# Patient Record
Sex: Female | Born: 1950 | Race: White | Hispanic: No | Marital: Married | Smoking: Never smoker
Health system: Southern US, Community
[De-identification: ages and names within clinical notes are randomized; demographics above are authoritative.]

## PROBLEM LIST (undated history)

## (undated) DIAGNOSIS — K449 Diaphragmatic hernia without obstruction or gangrene: Secondary | ICD-10-CM

## (undated) DIAGNOSIS — T7840XA Allergy, unspecified, initial encounter: Secondary | ICD-10-CM

## (undated) DIAGNOSIS — Z800 Family history of malignant neoplasm of digestive organs: Secondary | ICD-10-CM

## (undated) DIAGNOSIS — E119 Type 2 diabetes mellitus without complications: Secondary | ICD-10-CM

## (undated) DIAGNOSIS — Z803 Family history of malignant neoplasm of breast: Secondary | ICD-10-CM

## (undated) DIAGNOSIS — K219 Gastro-esophageal reflux disease without esophagitis: Secondary | ICD-10-CM

## (undated) DIAGNOSIS — L309 Dermatitis, unspecified: Secondary | ICD-10-CM

## (undated) DIAGNOSIS — N761 Subacute and chronic vaginitis: Secondary | ICD-10-CM

## (undated) DIAGNOSIS — G4730 Sleep apnea, unspecified: Secondary | ICD-10-CM

## (undated) DIAGNOSIS — J45909 Unspecified asthma, uncomplicated: Secondary | ICD-10-CM

## (undated) DIAGNOSIS — N941 Unspecified dyspareunia: Secondary | ICD-10-CM

## (undated) DIAGNOSIS — I10 Essential (primary) hypertension: Secondary | ICD-10-CM

## (undated) DIAGNOSIS — N952 Postmenopausal atrophic vaginitis: Secondary | ICD-10-CM

## (undated) DIAGNOSIS — E039 Hypothyroidism, unspecified: Secondary | ICD-10-CM

## (undated) DIAGNOSIS — I8390 Asymptomatic varicose veins of unspecified lower extremity: Secondary | ICD-10-CM

## (undated) DIAGNOSIS — E785 Hyperlipidemia, unspecified: Secondary | ICD-10-CM

## (undated) DIAGNOSIS — Z780 Asymptomatic menopausal state: Secondary | ICD-10-CM

## (undated) HISTORY — DX: Postmenopausal atrophic vaginitis: N95.2

## (undated) HISTORY — DX: Family history of malignant neoplasm of digestive organs: Z80.0

## (undated) HISTORY — DX: Sleep apnea, unspecified: G47.30

## (undated) HISTORY — DX: Unspecified dyspareunia: N94.10

## (undated) HISTORY — DX: Asymptomatic varicose veins of unspecified lower extremity: I83.90

## (undated) HISTORY — DX: Diaphragmatic hernia without obstruction or gangrene: K44.9

## (undated) HISTORY — DX: Subacute and chronic vaginitis: N76.1

## (undated) HISTORY — DX: Allergy, unspecified, initial encounter: T78.40XA

## (undated) HISTORY — DX: Morbid (severe) obesity due to excess calories: E66.01

## (undated) HISTORY — DX: Family history of malignant neoplasm of breast: Z80.3

## (undated) HISTORY — PX: NASAL SINUS SURGERY: SHX719

## (undated) HISTORY — PX: CHOLECYSTECTOMY: SHX55

## (undated) HISTORY — DX: Asymptomatic menopausal state: Z78.0

## (undated) HISTORY — PX: BREAST SURGERY: SHX581

## (undated) HISTORY — DX: Dermatitis, unspecified: L30.9

## (undated) HISTORY — DX: Gastro-esophageal reflux disease without esophagitis: K21.9

## (undated) HISTORY — PX: DILATION AND CURETTAGE OF UTERUS: SHX78

## (undated) HISTORY — DX: Type 2 diabetes mellitus without complications: E11.9

## (undated) SURGERY — Surgical Case
Anesthesia: *Unknown

---

## 1980-03-02 HISTORY — PX: VAGINAL HYSTERECTOMY: SUR661

## 1994-03-02 HISTORY — PX: BREAST EXCISIONAL BIOPSY: SUR124

## 2004-07-24 ENCOUNTER — Ambulatory Visit: Payer: Self-pay | Admitting: Unknown Physician Specialty

## 2005-08-18 ENCOUNTER — Ambulatory Visit: Payer: Self-pay | Admitting: Unknown Physician Specialty

## 2005-08-25 ENCOUNTER — Ambulatory Visit: Payer: Self-pay | Admitting: Unknown Physician Specialty

## 2006-02-19 ENCOUNTER — Ambulatory Visit: Payer: Self-pay | Admitting: Unknown Physician Specialty

## 2006-08-31 ENCOUNTER — Ambulatory Visit: Payer: Self-pay | Admitting: Unknown Physician Specialty

## 2007-09-01 ENCOUNTER — Ambulatory Visit: Payer: Self-pay | Admitting: Unknown Physician Specialty

## 2008-06-14 ENCOUNTER — Ambulatory Visit: Payer: Self-pay | Admitting: Family

## 2008-06-26 ENCOUNTER — Ambulatory Visit: Payer: Self-pay | Admitting: Unknown Physician Specialty

## 2008-09-24 ENCOUNTER — Ambulatory Visit: Payer: Self-pay | Admitting: Unknown Physician Specialty

## 2009-09-25 ENCOUNTER — Ambulatory Visit: Payer: Self-pay | Admitting: Unknown Physician Specialty

## 2009-10-23 ENCOUNTER — Ambulatory Visit: Payer: Self-pay | Admitting: Specialist

## 2010-05-12 ENCOUNTER — Ambulatory Visit: Payer: Self-pay | Admitting: General Practice

## 2010-11-06 ENCOUNTER — Ambulatory Visit: Payer: Self-pay | Admitting: Internal Medicine

## 2011-11-09 ENCOUNTER — Ambulatory Visit: Payer: Self-pay | Admitting: Internal Medicine

## 2012-11-09 ENCOUNTER — Ambulatory Visit: Payer: Self-pay | Admitting: Internal Medicine

## 2013-11-14 ENCOUNTER — Ambulatory Visit: Payer: Self-pay | Admitting: Internal Medicine

## 2014-01-17 ENCOUNTER — Encounter: Payer: Self-pay | Admitting: Podiatry

## 2014-01-17 ENCOUNTER — Ambulatory Visit (INDEPENDENT_AMBULATORY_CARE_PROVIDER_SITE_OTHER): Payer: 59 | Admitting: Podiatry

## 2014-01-17 ENCOUNTER — Other Ambulatory Visit: Payer: Self-pay | Admitting: *Deleted

## 2014-01-17 VITALS — BP 97/70 | HR 87 | Resp 16 | Ht 64.0 in | Wt 233.0 lb

## 2014-01-17 DIAGNOSIS — Q828 Other specified congenital malformations of skin: Secondary | ICD-10-CM

## 2014-01-17 DIAGNOSIS — E119 Type 2 diabetes mellitus without complications: Secondary | ICD-10-CM

## 2014-01-17 NOTE — Progress Notes (Signed)
She presents today for a diabetic checkup of the bilateral foot. She states that she has a painful lesion to the plantar lateral aspect of the left foot just beneath the little toe that is painful with shoe gear. She states that I would like to have it removed.  Objective: Vital signs are stable she is alert and oriented 3. Pulses are strongly palpable bilateral. Neurologic sensorium is intact versus once the monofilament. Multiple varicosities with mild edema to the bilateral lower extremities. Deep tendon reflexes are intact bilateral muscle strength is 5 over 5 dorsiflexion plantar flexors and inverters and everters all intrinsic musculature is intact. Orthopedic evaluation demonstrates all joints distal to the ankle for range of motion without crepitation. Mild hallux valgus deformity with flexible hammertoe deformities are noted bilateral but are asymptomatic. She does have some dorsal spurring first metatarsal second metatarsal cuneiform joint area left foot consistent with early osteoarthritic changes and some tenderness on palpation of the deep peroneal nerve. Cutaneous evaluation demonstrates supple well-hydrated cutis erythema cellulitis drainage or odor. Porokeratotic lesion sub-fifth metatarsal head of the left foot is present. I removed that porokeratosis today without complication.  Assessment:diabetes mellitus type 2 without pedal complications. 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 Non-complicated.early osteoarthritic changes mid foot left.  Plan: Debrided area of reactive hyperkeratosis. No iatrogenic lesions. We discussed appropriate shoe gear to prevent compression of the deep peroneal nerve to the dorsal aspect of the left foot.I will follow-up with her in 1 year or sooner if necessary.

## 2014-05-01 ENCOUNTER — Ambulatory Visit
Admit: 2014-05-01 | Disposition: A | Discharge status: Home / Self Care | Payer: Self-pay | Attending: Internal Medicine | Admitting: Internal Medicine

## 2014-05-01 DIAGNOSIS — M1990 Unspecified osteoarthritis, unspecified site: Secondary | ICD-10-CM | POA: Diagnosis not present

## 2014-05-01 DIAGNOSIS — Z79899 Other long term (current) drug therapy: Secondary | ICD-10-CM | POA: Diagnosis not present

## 2014-05-01 DIAGNOSIS — Z7982 Long term (current) use of aspirin: Secondary | ICD-10-CM | POA: Diagnosis not present

## 2014-05-01 DIAGNOSIS — E039 Hypothyroidism, unspecified: Secondary | ICD-10-CM | POA: Diagnosis not present

## 2014-05-01 DIAGNOSIS — D72829 Elevated white blood cell count, unspecified: Secondary | ICD-10-CM | POA: Diagnosis not present

## 2014-05-01 DIAGNOSIS — I10 Essential (primary) hypertension: Secondary | ICD-10-CM | POA: Diagnosis not present

## 2014-05-01 DIAGNOSIS — E119 Type 2 diabetes mellitus without complications: Secondary | ICD-10-CM | POA: Diagnosis not present

## 2014-05-01 DIAGNOSIS — K219 Gastro-esophageal reflux disease without esophagitis: Secondary | ICD-10-CM | POA: Diagnosis not present

## 2014-05-01 DIAGNOSIS — E781 Pure hyperglyceridemia: Secondary | ICD-10-CM | POA: Diagnosis not present

## 2014-05-01 DIAGNOSIS — D509 Iron deficiency anemia, unspecified: Secondary | ICD-10-CM | POA: Diagnosis not present

## 2014-05-01 DIAGNOSIS — K529 Noninfective gastroenteritis and colitis, unspecified: Secondary | ICD-10-CM | POA: Diagnosis not present

## 2014-05-01 DIAGNOSIS — J309 Allergic rhinitis, unspecified: Secondary | ICD-10-CM | POA: Diagnosis not present

## 2014-05-01 DIAGNOSIS — R0981 Nasal congestion: Secondary | ICD-10-CM | POA: Diagnosis not present

## 2014-05-01 DIAGNOSIS — J45909 Unspecified asthma, uncomplicated: Secondary | ICD-10-CM | POA: Diagnosis not present

## 2014-05-15 DIAGNOSIS — D72829 Elevated white blood cell count, unspecified: Secondary | ICD-10-CM | POA: Diagnosis not present

## 2014-05-15 DIAGNOSIS — J309 Allergic rhinitis, unspecified: Secondary | ICD-10-CM | POA: Diagnosis not present

## 2014-05-15 DIAGNOSIS — E119 Type 2 diabetes mellitus without complications: Secondary | ICD-10-CM | POA: Diagnosis not present

## 2014-05-15 DIAGNOSIS — D509 Iron deficiency anemia, unspecified: Secondary | ICD-10-CM | POA: Diagnosis not present

## 2014-05-15 DIAGNOSIS — R0981 Nasal congestion: Secondary | ICD-10-CM | POA: Diagnosis not present

## 2014-05-15 DIAGNOSIS — K529 Noninfective gastroenteritis and colitis, unspecified: Secondary | ICD-10-CM | POA: Diagnosis not present

## 2014-05-29 DIAGNOSIS — J301 Allergic rhinitis due to pollen: Secondary | ICD-10-CM | POA: Diagnosis not present

## 2014-05-29 DIAGNOSIS — J4530 Mild persistent asthma, uncomplicated: Secondary | ICD-10-CM | POA: Diagnosis not present

## 2014-05-30 DIAGNOSIS — J328 Other chronic sinusitis: Secondary | ICD-10-CM | POA: Diagnosis not present

## 2014-05-30 DIAGNOSIS — R0982 Postnasal drip: Secondary | ICD-10-CM | POA: Diagnosis not present

## 2014-05-30 DIAGNOSIS — J301 Allergic rhinitis due to pollen: Secondary | ICD-10-CM | POA: Diagnosis not present

## 2014-06-01 ENCOUNTER — Ambulatory Visit
Admit: 2014-06-01 | Disposition: A | Discharge status: Home / Self Care | Payer: Self-pay | Attending: Internal Medicine | Admitting: Internal Medicine

## 2014-06-05 LAB — CBC CANCER CENTER
BASOS ABS: 0 x10 3/mm (ref 0.0–0.1)
Basophil %: 0.3 %
EOS ABS: 0.2 x10 3/mm (ref 0.0–0.7)
Eosinophil %: 1.2 %
HCT: 38.7 % (ref 35.0–47.0)
HGB: 12.8 g/dL (ref 12.0–16.0)
LYMPHS ABS: 3.3 x10 3/mm (ref 1.0–3.6)
LYMPHS PCT: 21.5 %
MCH: 29.4 pg (ref 26.0–34.0)
MCHC: 33.1 g/dL (ref 32.0–36.0)
MCV: 89 fL (ref 80–100)
MONOS PCT: 6.4 %
Monocyte #: 1 x10 3/mm — ABNORMAL HIGH (ref 0.2–0.9)
NEUTROS ABS: 10.8 x10 3/mm — AB (ref 1.4–6.5)
NEUTROS PCT: 70.6 %
Platelet: 308 x10 3/mm (ref 150–440)
RBC: 4.34 10*6/uL (ref 3.80–5.20)
RDW: 13.2 % (ref 11.5–14.5)
WBC: 15.3 x10 3/mm — AB (ref 3.6–11.0)

## 2014-09-11 ENCOUNTER — Ambulatory Visit: Payer: Self-pay | Admitting: Family Medicine

## 2014-09-11 ENCOUNTER — Other Ambulatory Visit: Payer: Self-pay

## 2014-10-01 ENCOUNTER — Other Ambulatory Visit: Payer: Self-pay | Admitting: Obstetrics and Gynecology

## 2014-10-01 DIAGNOSIS — Z1231 Encounter for screening mammogram for malignant neoplasm of breast: Secondary | ICD-10-CM

## 2014-10-11 ENCOUNTER — Encounter: Payer: Self-pay | Admitting: *Deleted

## 2014-10-12 ENCOUNTER — Ambulatory Visit
Admission: RE | Admit: 2014-10-12 | Discharge: 2014-10-12 | Disposition: A | Discharge status: Home / Self Care | Payer: Medicare HMO | Source: Ambulatory Visit | Attending: Unknown Physician Specialty | Admitting: Unknown Physician Specialty

## 2014-10-12 ENCOUNTER — Encounter: Payer: Self-pay | Admitting: Anesthesiology

## 2014-10-12 ENCOUNTER — Encounter
Admission: RE | Disposition: A | Discharge status: Home / Self Care | Payer: Self-pay | Source: Ambulatory Visit | Attending: Unknown Physician Specialty

## 2014-10-12 ENCOUNTER — Ambulatory Visit: Payer: Medicare HMO | Admitting: Anesthesiology

## 2014-10-12 DIAGNOSIS — K2980 Duodenitis without bleeding: Secondary | ICD-10-CM | POA: Diagnosis not present

## 2014-10-12 DIAGNOSIS — R1012 Left upper quadrant pain: Secondary | ICD-10-CM | POA: Diagnosis present

## 2014-10-12 DIAGNOSIS — K5730 Diverticulosis of large intestine without perforation or abscess without bleeding: Secondary | ICD-10-CM | POA: Insufficient documentation

## 2014-10-12 DIAGNOSIS — R1310 Dysphagia, unspecified: Secondary | ICD-10-CM | POA: Diagnosis not present

## 2014-10-12 DIAGNOSIS — K317 Polyp of stomach and duodenum: Secondary | ICD-10-CM | POA: Insufficient documentation

## 2014-10-12 DIAGNOSIS — Z882 Allergy status to sulfonamides status: Secondary | ICD-10-CM | POA: Diagnosis not present

## 2014-10-12 DIAGNOSIS — Z90710 Acquired absence of both cervix and uterus: Secondary | ICD-10-CM | POA: Insufficient documentation

## 2014-10-12 DIAGNOSIS — Z888 Allergy status to other drugs, medicaments and biological substances status: Secondary | ICD-10-CM | POA: Diagnosis not present

## 2014-10-12 DIAGNOSIS — Z7982 Long term (current) use of aspirin: Secondary | ICD-10-CM | POA: Insufficient documentation

## 2014-10-12 DIAGNOSIS — Z791 Long term (current) use of non-steroidal anti-inflammatories (NSAID): Secondary | ICD-10-CM | POA: Diagnosis not present

## 2014-10-12 DIAGNOSIS — J45909 Unspecified asthma, uncomplicated: Secondary | ICD-10-CM | POA: Insufficient documentation

## 2014-10-12 DIAGNOSIS — E785 Hyperlipidemia, unspecified: Secondary | ICD-10-CM | POA: Diagnosis not present

## 2014-10-12 DIAGNOSIS — D509 Iron deficiency anemia, unspecified: Secondary | ICD-10-CM | POA: Insufficient documentation

## 2014-10-12 DIAGNOSIS — E039 Hypothyroidism, unspecified: Secondary | ICD-10-CM | POA: Diagnosis not present

## 2014-10-12 DIAGNOSIS — Z9049 Acquired absence of other specified parts of digestive tract: Secondary | ICD-10-CM | POA: Diagnosis not present

## 2014-10-12 DIAGNOSIS — I10 Essential (primary) hypertension: Secondary | ICD-10-CM | POA: Insufficient documentation

## 2014-10-12 DIAGNOSIS — K640 First degree hemorrhoids: Secondary | ICD-10-CM | POA: Insufficient documentation

## 2014-10-12 DIAGNOSIS — Z79899 Other long term (current) drug therapy: Secondary | ICD-10-CM | POA: Diagnosis not present

## 2014-10-12 HISTORY — DX: Hyperlipidemia, unspecified: E78.5

## 2014-10-12 HISTORY — DX: Essential (primary) hypertension: I10

## 2014-10-12 HISTORY — DX: Unspecified asthma, uncomplicated: J45.909

## 2014-10-12 HISTORY — DX: Hypothyroidism, unspecified: E03.9

## 2014-10-12 HISTORY — PX: ESOPHAGOGASTRODUODENOSCOPY (EGD) WITH PROPOFOL: SHX5813

## 2014-10-12 HISTORY — PX: COLONOSCOPY WITH PROPOFOL: SHX5780

## 2014-10-12 LAB — GLUCOSE, CAPILLARY: GLUCOSE-CAPILLARY: 129 mg/dL — AB (ref 65–99)

## 2014-10-12 SURGERY — COLONOSCOPY WITH PROPOFOL
Anesthesia: General

## 2014-10-12 MED ORDER — SODIUM CHLORIDE 0.9 % IV SOLN: INTRAVENOUS | Status: DC

## 2014-10-12 MED ORDER — LIDOCAINE HCL (CARDIAC) 20 MG/ML IV SOLN
INTRAVENOUS | Status: DC | PRN
  Administered 2014-10-12: 40 mg via INTRAVENOUS

## 2014-10-12 MED ORDER — GLYCOPYRROLATE 0.2 MG/ML IJ SOLN
INTRAMUSCULAR | Status: DC | PRN
  Administered 2014-10-12: 0.1 mg via INTRAVENOUS

## 2014-10-12 MED ORDER — PROPOFOL INFUSION 10 MG/ML OPTIME
INTRAVENOUS | Status: DC | PRN
  Administered 2014-10-12: 120 ug/kg/min via INTRAVENOUS

## 2014-10-12 MED ORDER — FENTANYL CITRATE (PF) 100 MCG/2ML IJ SOLN
INTRAMUSCULAR | Status: DC | PRN
  Administered 2014-10-12: 50 ug via INTRAVENOUS

## 2014-10-12 MED ORDER — SODIUM CHLORIDE 0.9 % IV SOLN
INTRAVENOUS | Status: DC
  Administered 2014-10-12: 1000 mL via INTRAVENOUS

## 2014-10-12 MED ORDER — MIDAZOLAM HCL 2 MG/2ML IJ SOLN
INTRAMUSCULAR | Status: DC | PRN
  Administered 2014-10-12: 1 mg via INTRAVENOUS

## 2014-10-12 NOTE — Anesthesia Preprocedure Evaluation (Addendum)
Anesthesia Evaluation  Patient identified by MRN, date of birth, ID band Patient awake    Reviewed: Allergy & Precautions, H&P , NPO status , Patient's Chart, lab work & pertinent test results, reviewed documented beta blocker date and time   History of Anesthesia Complications Negative for: history of anesthetic complications  Airway Mallampati: I  TM Distance: >3 FB Neck ROM: full    Dental no notable dental hx. (+) Teeth Intact   Pulmonary neg shortness of breath, asthma , neg sleep apnea, neg COPDneg recent URI,  breath sounds clear to auscultation  Pulmonary exam normal       Cardiovascular Exercise Tolerance: Good hypertension, On Medications - angina- CAD, - Past MI and - CABG Normal cardiovascular exam- dysrhythmias - Valvular Problems/MurmursRhythm:regular Rate:Normal     Neuro/Psych negative neurological ROS  negative psych ROS   GI/Hepatic Neg liver ROS, GERD-  ,  Endo/Other  diabetes, Well Controlled, Oral Hypoglycemic AgentsHypothyroidism Morbid obesity  Renal/GU negative Renal ROS  negative genitourinary   Musculoskeletal   Abdominal   Peds  Hematology negative hematology ROS (+)   Anesthesia Other Findings Past Medical History:   Hypothyroidism                                               Hypertension                                                 Asthma                                                       Hyperlipidemia                                               Reproductive/Obstetrics negative OB ROS                            Anesthesia Physical Anesthesia Plan  ASA: III  Anesthesia Plan: General   Post-op Pain Management:    Induction:   Airway Management Planned:   Additional Equipment:   Intra-op Plan:   Post-operative Plan:   Informed Consent: I have reviewed the patients History and Physical, chart, labs and discussed the procedure including the  risks, benefits and alternatives for the proposed anesthesia with the patient or authorized representative who has indicated his/her understanding and acceptance.   Dental Advisory Given  Plan Discussed with: Anesthesiologist, CRNA and Surgeon  Anesthesia Plan Comments:         Anesthesia Quick Evaluation

## 2014-10-12 NOTE — Transfer of Care (Signed)
Immediate Anesthesia Transfer of Care Note  Patient: Danielle Delgado  Procedure(s) Performed: Procedure(s): COLONOSCOPY WITH PROPOFOL (N/A) ESOPHAGOGASTRODUODENOSCOPY (EGD) WITH PROPOFOL (N/A)  Patient Location: PACU  Anesthesia Type:General  Level of Consciousness: awake and sedated  Airway & Oxygen Therapy: Patient Spontanous Breathing and Patient connected to nasal cannula oxygen  Post-op Assessment: Report given to RN and Post -op Vital signs reviewed and stable  Post vital signs: Reviewed and stable  Last Vitals:  Filed Vitals:   10/12/14 1022  BP: 143/79  Pulse: 81  Temp: 36.8 C  Resp: 16    Complications: No apparent anesthesia complications

## 2014-10-12 NOTE — Op Note (Signed)
[facility] Gastroenterology Patient Name: Danielle Delgado Procedure Date: 10/12/2014 10:43 AM MRN: [id number] Account #: [id number] Date of Birth: Sep 14, 1950 Admit Type: Outpatient Age: 64 Room: [facility] ENDO ROOM 4 Gender: Female Note Status: Finalized Procedure:         Upper GI endoscopy Indications:       Abdominal pain in the left upper quadrant, Iron deficiency                     anemia Providers:         Manya Silvas, MD Referring MD:      Casilda Carls, MD (Referring MD) Medicines:         Propofol per Anesthesia Complications:     No immediate complications. Procedure:         Pre-Anesthesia Assessment:                    - After reviewing the risks and benefits, the patient was                     deemed in satisfactory condition to undergo the procedure.                    After obtaining informed consent, the endoscope was passed                     under direct vision. Throughout the procedure, the                     patient's blood pressure, pulse, and oxygen saturations                     were monitored continuously. The Endoscope was introduced                     through the mouth, and advanced to the second part of                     duodenum. The upper GI endoscopy was accomplished without                     difficulty. The patient tolerated the procedure well. Findings:      The examined esophagus was normal.      Five medium pedunculated and sessile polyps with no bleeding and       stigmata of recent bleeding were found in the gastric body. These polyps       were removed with a hot snare. Resection and retrieval were complete.      Patchy mildly erythematous mucosa without active bleeding and with no       stigmata of bleeding was found in the duodenal bulb. Impression:        - Normal esophagus.                    - Five gastric polyps. Resected and retrieved.                    - Normal examined duodenum. Recommendation:    - Await  pathology results.                    - Perform a colonoscopy as previously scheduled. Manya Silvas, MD 10/12/2014 11:10:04 AM This report has been signed electronically. Number of Addenda: 0 Note Initiated On: 10/12/2014  10:43 AM      [facility]

## 2014-10-12 NOTE — Op Note (Signed)
[facility] Gastroenterology Patient Name: Danielle Delgado Procedure Date: 10/12/2014 10:43 AM MRN: [id number] Account #: [id number] Date of Birth: 12-11-1950 Admit Type: Outpatient Age: 64 Room: [facility] ENDO ROOM 4 Gender: Female Note Status: Finalized Procedure:         Colonoscopy Indications:       Iron deficiency anemia Providers:         Manya Silvas, MD Referring MD:      Casilda Carls, MD (Referring MD) Medicines:         Propofol per Anesthesia Complications:     No immediate complications. Procedure:         Pre-Anesthesia Assessment:                    - After reviewing the risks and benefits, the patient was                     deemed in satisfactory condition to undergo the procedure.                    After obtaining informed consent, the colonoscope was                     passed under direct vision. Throughout the procedure, the                     patient's blood pressure, pulse, and oxygen saturations                     were monitored continuously. The Colonoscope was                     introduced through the anus and advanced to the the cecum,                     identified by appendiceal orifice and ileocecal valve. The                     colonoscopy was performed without difficulty. The patient                     tolerated the procedure well. The quality of the bowel                     preparation was excellent. Findings:      Internal hemorrhoids were found during endoscopy. The hemorrhoids were       small and Grade I (internal hemorrhoids that do not prolapse).      Multiple medium-mouthed diverticula were found in the sigmoid colon.      The exam was otherwise without abnormality. Impression:        - Internal hemorrhoids.                    - Diverticulosis in the sigmoid colon.                    - The examination was otherwise normal.                    - No specimens collected. Recommendation:    - Await pathology  results. Manya Silvas, MD 10/12/2014 11:26:32 AM This report has been signed electronically. Number of Addenda: 0 Note Initiated On: 10/12/2014 10:43 AM Scope Withdrawal Time: 0 hours 8 minutes 41 seconds  Total Procedure Duration:  0 hours 12 minutes 0 seconds       [facility]

## 2014-10-12 NOTE — Anesthesia Procedure Notes (Signed)
Performed by: Vaughan Sine Pre-anesthesia Checklist: Patient identified, Suction available, Emergency Drugs available, Patient being monitored and Timeout performed Patient Re-evaluated:Patient Re-evaluated prior to inductionOxygen Delivery Method: Nasal cannula Preoxygenation: Pre-oxygenation with 100% oxygen Intubation Type: IV induction Airway Equipment and Method: Bite block Placement Confirmation: positive ETCO2 and CO2 detector

## 2014-10-12 NOTE — H&P (Signed)
Primary Care Physician:  Casilda Carls, MD Primary Gastroenterologist:  Dr. Vira Agar  Pre-Procedure History & Physical: HPI:  Danielle Delgado is a 64 y.o. female is here for an endoscopy and colonoscopy.   Past Medical History  Diagnosis Date  . Hypothyroidism   . Hypertension   . Asthma   . Hyperlipidemia     Past Surgical History  Procedure Laterality Date  . Abdominal hysterectomy    . Cholecystectomy      Prior to Admission medications   Medication Sig Start Date End Date Taking? Authorizing Provider  ACCU-CHEK AVIVA PLUS test strip  10/21/13  Yes Historical Provider, MD  acetaminophen (TYLENOL) 500 MG tablet Take 1,000 mg by mouth every 6 (six) hours as needed.   Yes Historical Provider, MD  aspirin 325 MG EC tablet Take 325 mg by mouth daily.   Yes Historical Provider, MD  atorvastatin (LIPITOR) 10 MG tablet  01/11/14  Yes Historical Provider, MD  ferrous sulfate 325 (65 FE) MG tablet Take 325 mg by mouth daily with breakfast.   Yes Historical Provider, MD  losartan-hydrochlorothiazide (HYZAAR) 50-12.5 MG per tablet  01/11/14  Yes Historical Provider, MD  montelukast (SINGULAIR) 10 MG tablet  01/12/14  Yes Historical Provider, MD  naproxen sodium (ANAPROX) 220 MG tablet Take 440 mg by mouth as needed.   Yes Historical Provider, MD  ONGLYZA 5 MG TABS tablet  01/11/14  Yes Historical Provider, MD  pantoprazole (PROTONIX) 40 MG tablet  01/11/14  Yes Historical Provider, MD  Doug Sou 90 MCG/ACT inhaler  12/21/13  Yes Historical Provider, MD  SYNTHROID 125 MCG tablet  01/12/14  Yes Historical Provider, MD  valsartan-hydrochlorothiazide (DIOVAN-HCT) 160-25 MG per tablet Take 1 tablet by mouth daily.   Yes Historical Provider, MD  metformin (FORTAMET) 1000 MG (OSM) 24 hr tablet Take 1,000 mg by mouth daily with breakfast.    Historical Provider, MD    Allergies as of 09/28/2014 - Review Complete 01/17/2014  Allergen Reaction Noted  . Propulsid [cisapride] Hives  01/17/2014  . Sulfa antibiotics Hives 01/17/2014    History reviewed. No pertinent family history.  Social History   Social History  . Marital Status: Married    Spouse Name: N/A  . Number of Children: N/A  . Years of Education: N/A   Occupational History  . Not on file.   Social History Main Topics  . Smoking status: Never Smoker   . Smokeless tobacco: Not on file  . Alcohol Use: No  . Drug Use: Not on file  . Sexual Activity: Not on file   Other Topics Concern  . Not on file   Social History Narrative    Review of Systems: See HPI, otherwise negative ROS  Physical Exam: BP 143/79 mmHg  Pulse 81  Temp(Src) 98.2 F (36.8 C) (Tympanic)  Resp 16  Ht 5\' 3"  (1.6 m)  Wt 105.235 kg (232 lb)  BMI 41.11 kg/m2  SpO2 97% General:   Alert,  pleasant and cooperative in NAD Head:  Normocephalic and atraumatic. Neck:  Supple; no masses or thyromegaly. Lungs:  Clear throughout to auscultation.    Heart:  Regular rate and rhythm. Abdomen:  Soft, nontender and nondistended. Normal bowel sounds, without guarding, and without rebound.   Neurologic:  Alert and  oriented x4;  grossly normal neurologically.  Impression/Plan: Danielle Delgado is here for an endoscopy and colonoscopy to be performed for iron def anemia  Risks, benefits, limitations, and alternatives regarding  endoscopy and colonoscopy  have been reviewed with the patient.  Questions have been answered.  All parties agreeable.   Gaylyn Cheers, MD  10/12/2014, 10:46 AM

## 2014-10-14 NOTE — Anesthesia Postprocedure Evaluation (Signed)
  Anesthesia Post-op Note  Patient: Danielle Delgado  Procedure(s) Performed: Procedure(s): COLONOSCOPY WITH PROPOFOL (N/A) ESOPHAGOGASTRODUODENOSCOPY (EGD) WITH PROPOFOL (N/A)  Anesthesia type:General  Patient location: PACU  Post pain: Pain level controlled  Post assessment: Post-op Vital signs reviewed, Patient's Cardiovascular Status Stable, Respiratory Function Stable, Patent Airway and No signs of Nausea or vomiting  Post vital signs: Reviewed and stable  Last Vitals:  Filed Vitals:   10/12/14 1202  BP: 124/76  Pulse: 81  Temp:   Resp: 15    Level of consciousness: awake, alert  and patient cooperative  Complications: No apparent anesthesia complications

## 2014-10-15 LAB — SURGICAL PATHOLOGY

## 2014-10-16 ENCOUNTER — Ambulatory Visit: Payer: Self-pay | Admitting: Family Medicine

## 2014-10-16 ENCOUNTER — Other Ambulatory Visit: Payer: Self-pay

## 2014-10-30 ENCOUNTER — Ambulatory Visit: Payer: Self-pay | Admitting: Family Medicine

## 2014-10-30 ENCOUNTER — Other Ambulatory Visit: Payer: Self-pay

## 2014-11-05 ENCOUNTER — Other Ambulatory Visit: Payer: Self-pay

## 2014-11-05 ENCOUNTER — Ambulatory Visit: Payer: Self-pay | Admitting: Family Medicine

## 2014-11-06 ENCOUNTER — Other Ambulatory Visit: Payer: Self-pay | Admitting: *Deleted

## 2014-11-06 ENCOUNTER — Inpatient Hospital Stay (HOSPITAL_BASED_OUTPATIENT_CLINIC_OR_DEPARTMENT_OTHER): Payer: Medicare HMO | Admitting: Family Medicine

## 2014-11-06 ENCOUNTER — Inpatient Hospital Stay: Payer: Medicare HMO | Attending: Family Medicine

## 2014-11-06 DIAGNOSIS — E785 Hyperlipidemia, unspecified: Secondary | ICD-10-CM

## 2014-11-06 DIAGNOSIS — Z7982 Long term (current) use of aspirin: Secondary | ICD-10-CM

## 2014-11-06 DIAGNOSIS — K5790 Diverticulosis of intestine, part unspecified, without perforation or abscess without bleeding: Secondary | ICD-10-CM | POA: Diagnosis not present

## 2014-11-06 DIAGNOSIS — K648 Other hemorrhoids: Secondary | ICD-10-CM

## 2014-11-06 DIAGNOSIS — Z79899 Other long term (current) drug therapy: Secondary | ICD-10-CM | POA: Insufficient documentation

## 2014-11-06 DIAGNOSIS — D72829 Elevated white blood cell count, unspecified: Secondary | ICD-10-CM

## 2014-11-06 DIAGNOSIS — Z86010 Personal history of colonic polyps: Secondary | ICD-10-CM

## 2014-11-06 DIAGNOSIS — D649 Anemia, unspecified: Secondary | ICD-10-CM

## 2014-11-06 DIAGNOSIS — I10 Essential (primary) hypertension: Secondary | ICD-10-CM | POA: Diagnosis not present

## 2014-11-06 DIAGNOSIS — J45909 Unspecified asthma, uncomplicated: Secondary | ICD-10-CM

## 2014-11-06 DIAGNOSIS — E039 Hypothyroidism, unspecified: Secondary | ICD-10-CM | POA: Insufficient documentation

## 2014-11-06 LAB — CBC WITH DIFFERENTIAL/PLATELET
Basophils Absolute: 0.1 10*3/uL (ref 0–0.1)
Basophils Relative: 1 %
Eosinophils Absolute: 0.5 10*3/uL (ref 0–0.7)
Eosinophils Relative: 4 %
HEMATOCRIT: 35.5 % (ref 35.0–47.0)
HEMOGLOBIN: 11.8 g/dL — AB (ref 12.0–16.0)
LYMPHS ABS: 3.3 10*3/uL (ref 1.0–3.6)
LYMPHS PCT: 29 %
MCH: 29.7 pg (ref 26.0–34.0)
MCHC: 33.3 g/dL (ref 32.0–36.0)
MCV: 89.3 fL (ref 80.0–100.0)
Monocytes Absolute: 0.6 10*3/uL (ref 0.2–0.9)
Monocytes Relative: 6 %
NEUTROS PCT: 60 %
Neutro Abs: 6.9 10*3/uL — ABNORMAL HIGH (ref 1.4–6.5)
PLATELETS: 284 10*3/uL (ref 150–440)
RBC: 3.98 MIL/uL (ref 3.80–5.20)
RDW: 13.8 % (ref 11.5–14.5)
WBC: 11.4 10*3/uL — ABNORMAL HIGH (ref 3.6–11.0)

## 2014-11-06 NOTE — Progress Notes (Signed)
[facility] [place]  [phone]  [phone]     Danielle Delgado DOB: August 21, 1950  MR#: [id number]  QIW#:[id number]  Patient Care Team: Casilda Carls, MD as PCP - General (Internal Medicine)  CHIEF COMPLAINT:  Patient is here for follow-up regarding leukocytosis and iron deficiency anemia.  INTERVAL HISTORY: Patient is here for continued follow-up and treatment consideration regarding leukocytosis and anemia. She has been followed for leukocytosis with mild neutrophilia since 2009. Her primary care provider is Dr. Rosario Jacks. She reports most recently having a colonoscopy and endoscopy with Dr. Vira Agar in August. She was noted on endoscopy to have 5 medium polyps with no active bleeding and a stigmata of recent bleeding in the gastric body. This polyps were removed. Colonoscopy found patient to have internal hemorrhoids, small grade 1, also noted to have multiple medium mouth diverticula in the sigmoid colon. Patient otherwise feels very well, she denies any acute complaints. She states that Dr. Inez Pilgrim has told her if her labs were previously her stable that this could be her last visit.  REVIEW OF SYSTEMS:   Review of Systems  Constitutional: Negative for fever, chills, weight loss, malaise/fatigue and diaphoresis.  HENT: Negative for congestion, ear discharge, ear pain, hearing loss, nosebleeds, sore throat and tinnitus.   Eyes: Negative for blurred vision, double vision, photophobia, pain, discharge and redness.  Respiratory: Negative for cough, hemoptysis, sputum production, shortness of breath, wheezing and stridor.   Cardiovascular: Negative for chest pain, palpitations, orthopnea, claudication, leg swelling and PND.  Gastrointestinal: Negative for heartburn, nausea, vomiting, abdominal pain, diarrhea, constipation, blood in stool and melena.  Genitourinary: Negative.   Musculoskeletal: Negative.   Skin: Negative.   Neurological: Negative for dizziness, tingling,  focal weakness, seizures, weakness and headaches.  Endo/Heme/Allergies: Does not bruise/bleed easily.  Psychiatric/Behavioral: Negative for depression. The patient is not nervous/anxious and does not have insomnia.     As per HPI. Otherwise, a complete review of systems is negatve.   PAST MEDICAL HISTORY: Past Medical History  Diagnosis Date  . Hypothyroidism   . Hypertension   . Asthma   . Hyperlipidemia     PAST SURGICAL HISTORY: Past Surgical History  Procedure Laterality Date  . Abdominal hysterectomy    . Cholecystectomy    . Colonoscopy with propofol N/A 10/12/2014    Procedure: COLONOSCOPY WITH PROPOFOL;  Surgeon: Manya Silvas, MD;  Location: [facility] ENDOSCOPY;  Service: Endoscopy;  Laterality: N/A;  . Esophagogastroduodenoscopy (egd) with propofol N/A 10/12/2014    Procedure: ESOPHAGOGASTRODUODENOSCOPY (EGD) WITH PROPOFOL;  Surgeon: Manya Silvas, MD;  Location: [facility] ENDOSCOPY;  Service: Endoscopy;  Laterality: N/A;    FAMILY HISTORY No family history on file.  GYNECOLOGIC HISTORY:  No LMP recorded. Patient has had a hysterectomy.     ADVANCED DIRECTIVES:    HEALTH MAINTENANCE: Social History  Substance Use Topics  . Smoking status: Never Smoker   . Smokeless tobacco: Not on file  . Alcohol Use: No     Colonoscopy:  PAP:  Bone density:  Lipid panel:  Allergies  Allergen Reactions  . Propulsid [Cisapride] Hives  . Sulfa Antibiotics Hives    Current Outpatient Prescriptions  Medication Sig Dispense Refill  . ACCU-CHEK AVIVA PLUS test strip     . acetaminophen (TYLENOL) 500 MG tablet Take 1,000 mg by mouth every 6 (six) hours as needed.    Marland Kitchen albuterol (PROAIR HFA) 108 (90 BASE) MCG/ACT inhaler Inhale into the lungs.    Marland Kitchen aspirin 325 MG EC tablet  Take 325 mg by mouth daily.    Marland Kitchen atorvastatin (LIPITOR) 10 MG tablet     . Biotin 5000 MCG TABS Take 1 tablet by mouth daily.    . calcium carbonate (OS-CAL) 600 MG TABS tablet Take 600 mg by mouth 2  (two) times daily with a meal.    . cetirizine (ZYRTEC) 10 MG tablet Take 10 mg by mouth daily.    . ferrous sulfate 325 (65 FE) MG tablet Take 325 mg by mouth daily with breakfast.    . glipiZIDE (GLUCOTROL XL) 5 MG 24 hr tablet     . losartan-hydrochlorothiazide (HYZAAR) 50-12.5 MG per tablet     . metFORMIN (GLUCOPHAGE) 1000 MG tablet Take by mouth.    . montelukast (SINGULAIR) 10 MG tablet Take by mouth.    . naproxen sodium (ANAPROX) 220 MG tablet Take 440 mg by mouth as needed.    . Omega-3 Fatty Acids (FISH OIL CONCENTRATE) 300 MG CAPS Take by mouth.    . ONGLYZA 5 MG TABS tablet     . pantoprazole (PROTONIX) 40 MG tablet     . PULMICORT FLEXHALER 90 MCG/ACT inhaler     . SYNTHROID 125 MCG tablet     . valsartan-hydrochlorothiazide (DIOVAN-HCT) 160-25 MG per tablet Take 1 tablet by mouth daily.     No current facility-administered medications for this visit.    OBJECTIVE: There were no vitals taken for this visit.   There is no weight on file to calculate BMI.    ECOG FS:0 - Asymptomatic  General: Well-developed, well-nourished, no acute distress. Eyes: Pink conjunctiva, anicteric sclera. HEENT: Normocephalic, moist mucous membranes, clear oropharnyx. Lungs: Clear to auscultation bilaterally. Heart: Regular rate and rhythm. No rubs, murmurs, or gallops. Abdomen: Soft, nontender, nondistended. No organomegaly noted, normoactive bowel sounds. Musculoskeletal: No edema, cyanosis, or clubbing. Neuro: Alert, answering all questions appropriately. Cranial nerves grossly intact. Skin: No rashes or petechiae noted. Psych: Normal affect.    LAB RESULTS:  Clinical Support on 11/06/2014  Component Date Value Ref Range Status  . WBC 11/06/2014 11.4* 3.6 - 11.0 K/uL Final  . RBC 11/06/2014 3.98  3.80 - 5.20 MIL/uL Final  . Hemoglobin 11/06/2014 11.8* 12.0 - 16.0 g/dL Final  . HCT 11/06/2014 35.5  35.0 - 47.0 % Final  . MCV 11/06/2014 89.3  80.0 - 100.0 fL Final  . MCH 11/06/2014  29.7  26.0 - 34.0 pg Final  . MCHC 11/06/2014 33.3  32.0 - 36.0 g/dL Final  . RDW 11/06/2014 13.8  11.5 - 14.5 % Final  . Platelets 11/06/2014 284  150 - 440 K/uL Final  . Neutrophils Relative % 11/06/2014 60   Final  . Neutro Abs 11/06/2014 6.9* 1.4 - 6.5 K/uL Final  . Lymphocytes Relative 11/06/2014 29   Final  . Lymphs Abs 11/06/2014 3.3  1.0 - 3.6 K/uL Final  . Monocytes Relative 11/06/2014 6   Final  . Monocytes Absolute 11/06/2014 0.6  0.2 - 0.9 K/uL Final  . Eosinophils Relative 11/06/2014 4   Final  . Eosinophils Absolute 11/06/2014 0.5  0 - 0.7 K/uL Final  . Basophils Relative 11/06/2014 1   Final  . Basophils Absolute 11/06/2014 0.1  0 - 0.1 K/uL Final    STUDIES: No results found.  ASSESSMENT:  Leukocytosis Anemia  PLAN:   1. Leukocytosis. Patient was previous mild neutrophilia from as far back as 2009. White count today is stable at 11.4. 2. Anemia. Patient's hemoglobin also remains stable at 11.8. Advised  patient that she no longer needed soon follow-up with hematology. Suggested to the patient that she continues on up with Dr. Rosario Jacks and CBCs every 4-6 months. If needed, patient denies that she may contact our office for further follow-up at any time.   Patient expressed understanding and was in agreement with this plan. She also understands that She can call clinic at any time with any questions, concerns, or complaints.   Dr. Oliva Bustard was available for consultation and review of plan of care for this patient.   Evlyn Kanner, NP   11/06/2014 12:22 PM

## 2014-11-07 ENCOUNTER — Encounter: Payer: Self-pay | Admitting: Family Medicine

## 2014-11-07 ENCOUNTER — Other Ambulatory Visit: Payer: Self-pay | Admitting: Family Medicine

## 2014-11-16 ENCOUNTER — Other Ambulatory Visit: Payer: Self-pay | Admitting: Obstetrics and Gynecology

## 2014-11-16 ENCOUNTER — Ambulatory Visit
Admission: RE | Admit: 2014-11-16 | Discharge: 2014-11-16 | Disposition: A | Discharge status: Home / Self Care | Payer: Medicare HMO | Source: Ambulatory Visit | Attending: Obstetrics and Gynecology | Admitting: Obstetrics and Gynecology

## 2014-11-16 DIAGNOSIS — Z1231 Encounter for screening mammogram for malignant neoplasm of breast: Secondary | ICD-10-CM | POA: Diagnosis present

## 2015-01-09 ENCOUNTER — Encounter: Payer: Self-pay | Admitting: Podiatry

## 2015-01-09 ENCOUNTER — Ambulatory Visit (INDEPENDENT_AMBULATORY_CARE_PROVIDER_SITE_OTHER): Payer: Medicare HMO | Admitting: Podiatry

## 2015-01-09 VITALS — BP 125/64 | HR 90 | Resp 18

## 2015-01-09 DIAGNOSIS — E119 Type 2 diabetes mellitus without complications: Secondary | ICD-10-CM

## 2015-01-09 DIAGNOSIS — M79673 Pain in unspecified foot: Secondary | ICD-10-CM

## 2015-01-09 DIAGNOSIS — M7670 Peroneal tendinitis, unspecified leg: Secondary | ICD-10-CM

## 2015-01-09 DIAGNOSIS — M7672 Peroneal tendinitis, left leg: Secondary | ICD-10-CM

## 2015-01-09 DIAGNOSIS — M2040 Other hammer toe(s) (acquired), unspecified foot: Secondary | ICD-10-CM

## 2015-01-09 MED ORDER — DICLOFENAC SODIUM 1 % TD GEL: 4.0000 g | Freq: Four times a day (QID) | TRANSDERMAL | Status: DC

## 2015-01-10 NOTE — Progress Notes (Signed)
She presents today for her yearly diabetic checkup. She states that she has had some tenderness along the lateral aspect of the left ankle for quite some time. She denies any trauma. She states that it hurts at some point every day but is dismissed quite easily. Otherwise she states that her blood sugars has been stable. No changes in her past medical history medications allergies surgery social history.  Objective: Vital signs are stable she is alert and oriented 3. Pulses are strongly palpable left. She does have diminished dorsalis pedis pulses but her capillary fill time is immediate. Neurologic sensorium is intact per Semmes-Weinstein monofilament and vibratory sensation is intact. Muscle strength is normal lateral. Orthopedic evaluation restraints all joints distal to the ankle or range of motion without crepitation. Mild pes planus is noted. She does have tenderness on palpation and swelling along the peroneal tendons as they course from posterior superior lateral malleolar region inferiorly toward the fifth metatarsal base. It appears to be fluctuance in here as if the tendon sheath is swollen. Cannot rule out tear at this point.  Assessment: Possible peroneal tendinitis left. Otherwise very good diabetic checkup.  Plan: I injected a small amount of dexamethasone along the peroneal tendon sheath today alleviating her symptoms. We will request for her primary doctor to consider diabetic shoes and we sent over the paperwork today. I will follow-up with her once those come in

## 2015-02-07 ENCOUNTER — Ambulatory Visit: Payer: Medicare HMO | Admitting: *Deleted

## 2015-02-07 DIAGNOSIS — E119 Type 2 diabetes mellitus without complications: Secondary | ICD-10-CM

## 2015-02-07 NOTE — Progress Notes (Signed)
Patient ID: Danielle Delgado, female   DOB: Nov 17, 1950, 64 y.o.   MRN: [id number] Patient presents to be scanned and measured for diabetic shoes and inserts.

## 2015-02-27 ENCOUNTER — Ambulatory Visit: Payer: Medicare HMO | Admitting: Podiatry

## 2015-02-28 ENCOUNTER — Ambulatory Visit (INDEPENDENT_AMBULATORY_CARE_PROVIDER_SITE_OTHER): Payer: Medicare HMO | Admitting: Podiatry

## 2015-02-28 DIAGNOSIS — E119 Type 2 diabetes mellitus without complications: Secondary | ICD-10-CM

## 2015-02-28 DIAGNOSIS — M2040 Other hammer toe(s) (acquired), unspecified foot: Secondary | ICD-10-CM | POA: Diagnosis not present

## 2015-02-28 DIAGNOSIS — L84 Corns and callosities: Secondary | ICD-10-CM | POA: Diagnosis not present

## 2015-02-28 NOTE — Progress Notes (Signed)
Patient ID: Danielle Delgado, female   DOB: [birth year], 64 y.o.   MRN: [id number] Patient presents for diabetic shoe pick up, shoes are tried on for good fit.  Patient received 1 Pair Apex A830W [name] black in women's size 7.5 wide and 3 pairs custom molded diabetic inserts.  Verbal and written break in and wear instructions given.  Patient will follow up for scheduled routine care. No acute changes since last appointment and no new complaints. Follow-up in 6 weeks

## 2015-02-28 NOTE — Patient Instructions (Signed)

## 2015-03-15 ENCOUNTER — Other Ambulatory Visit: Payer: Self-pay | Admitting: Internal Medicine

## 2015-03-15 DIAGNOSIS — G459 Transient cerebral ischemic attack, unspecified: Secondary | ICD-10-CM

## 2015-03-19 ENCOUNTER — Ambulatory Visit
Admission: RE | Admit: 2015-03-19 | Discharge: 2015-03-19 | Disposition: A | Discharge status: Home / Self Care | Payer: Medicare HMO | Source: Ambulatory Visit | Attending: Internal Medicine | Admitting: Internal Medicine

## 2015-03-19 DIAGNOSIS — G459 Transient cerebral ischemic attack, unspecified: Secondary | ICD-10-CM | POA: Insufficient documentation

## 2015-03-19 NOTE — Progress Notes (Signed)
*  PRELIMINARY RESULTS* Echocardiogram 2D Echocardiogram has been performed.  Danielle Delgado 03/19/2015, 10:40 AM

## 2015-03-28 ENCOUNTER — Other Ambulatory Visit: Payer: Medicare HMO

## 2015-04-04 ENCOUNTER — Other Ambulatory Visit: Payer: Self-pay | Admitting: Neurology

## 2015-04-04 DIAGNOSIS — R51 Headache: Principal | ICD-10-CM

## 2015-04-04 DIAGNOSIS — R519 Headache, unspecified: Secondary | ICD-10-CM

## 2015-04-15 ENCOUNTER — Ambulatory Visit: Payer: Medicare HMO | Admitting: Podiatry

## 2015-04-17 ENCOUNTER — Ambulatory Visit (INDEPENDENT_AMBULATORY_CARE_PROVIDER_SITE_OTHER): Payer: Medicare HMO | Admitting: Podiatry

## 2015-04-17 ENCOUNTER — Encounter: Payer: Self-pay | Admitting: Podiatry

## 2015-04-17 DIAGNOSIS — M7670 Peroneal tendinitis, unspecified leg: Secondary | ICD-10-CM

## 2015-04-17 DIAGNOSIS — M2040 Other hammer toe(s) (acquired), unspecified foot: Secondary | ICD-10-CM | POA: Diagnosis not present

## 2015-04-17 DIAGNOSIS — E119 Type 2 diabetes mellitus without complications: Secondary | ICD-10-CM | POA: Diagnosis not present

## 2015-04-17 DIAGNOSIS — M7672 Peroneal tendinitis, left leg: Secondary | ICD-10-CM

## 2015-04-17 DIAGNOSIS — Q828 Other specified congenital malformations of skin: Secondary | ICD-10-CM

## 2015-04-17 NOTE — Progress Notes (Signed)
She presents today for follow-up of her peroneal tendinitis of her left foot. She states as doing much better she would also like for me to take a look at her diabetic shoes.  Objective: Vital signs are stable she's alert and oriented 3. No open lesions or wounds to the left or right foot. Much decrease in pain on palpation of the peroneal tendons left foot.  Assessment: Diabetes mellitus diabetic shoe check. Resolving peroneal tendinitis left foot.  Plan: Follow up with me on an as-needed basis consider another pair shoes. Send her to physical therapy should she call requesting so.

## 2015-04-24 ENCOUNTER — Ambulatory Visit
Admission: RE | Admit: 2015-04-24 | Discharge: 2015-04-24 | Disposition: A | Discharge status: Home / Self Care | Payer: Medicare HMO | Source: Ambulatory Visit | Attending: Neurology | Admitting: Neurology

## 2015-04-24 DIAGNOSIS — J0100 Acute maxillary sinusitis, unspecified: Secondary | ICD-10-CM | POA: Diagnosis not present

## 2015-04-24 DIAGNOSIS — R51 Headache: Secondary | ICD-10-CM | POA: Insufficient documentation

## 2015-04-24 DIAGNOSIS — R4701 Aphasia: Secondary | ICD-10-CM | POA: Diagnosis present

## 2015-04-24 DIAGNOSIS — R519 Headache, unspecified: Secondary | ICD-10-CM

## 2015-04-24 DIAGNOSIS — Z9889 Other specified postprocedural states: Secondary | ICD-10-CM | POA: Insufficient documentation

## 2015-04-24 DIAGNOSIS — R410 Disorientation, unspecified: Secondary | ICD-10-CM | POA: Diagnosis present

## 2015-04-24 MED ORDER — GADOBENATE DIMEGLUMINE 529 MG/ML IV SOLN
20.0000 mL | Freq: Once | INTRAVENOUS | Status: AC | PRN
  Administered 2015-04-24: 20 mL via INTRAVENOUS

## 2015-06-05 ENCOUNTER — Ambulatory Visit (INDEPENDENT_AMBULATORY_CARE_PROVIDER_SITE_OTHER): Payer: Medicare HMO | Admitting: *Deleted

## 2015-06-05 DIAGNOSIS — M2040 Other hammer toe(s) (acquired), unspecified foot: Secondary | ICD-10-CM

## 2015-06-05 DIAGNOSIS — M7670 Peroneal tendinitis, unspecified leg: Secondary | ICD-10-CM

## 2015-06-05 DIAGNOSIS — M7672 Peroneal tendinitis, left leg: Secondary | ICD-10-CM

## 2015-06-05 DIAGNOSIS — E119 Type 2 diabetes mellitus without complications: Secondary | ICD-10-CM

## 2015-06-06 NOTE — Progress Notes (Signed)
Measured for diabetic shoes and insoles. Will contact patient once her shoes come in.

## 2015-07-03 ENCOUNTER — Ambulatory Visit: Payer: Medicare HMO

## 2015-07-03 ENCOUNTER — Ambulatory Visit (INDEPENDENT_AMBULATORY_CARE_PROVIDER_SITE_OTHER): Payer: Medicare HMO | Admitting: Podiatry

## 2015-07-03 DIAGNOSIS — L84 Corns and callosities: Secondary | ICD-10-CM

## 2015-07-03 DIAGNOSIS — M2040 Other hammer toe(s) (acquired), unspecified foot: Secondary | ICD-10-CM

## 2015-07-03 DIAGNOSIS — E119 Type 2 diabetes mellitus without complications: Secondary | ICD-10-CM

## 2015-07-09 NOTE — Progress Notes (Signed)
Dispensed diabetic shoes and 3 pairs of insoles. Instructions were reviewed and a copy was given to the patient. Patient to reappointment for regularly scheduled diabetic foot care visits or if she experiences any trouble with her diabetic shoes. 

## 2015-07-10 ENCOUNTER — Encounter: Payer: Self-pay | Admitting: Obstetrics and Gynecology

## 2015-07-10 ENCOUNTER — Ambulatory Visit (INDEPENDENT_AMBULATORY_CARE_PROVIDER_SITE_OTHER): Payer: Medicare HMO | Admitting: Obstetrics and Gynecology

## 2015-07-10 VITALS — BP 126/74 | HR 82 | Ht 64.0 in | Wt 242.0 lb

## 2015-07-10 DIAGNOSIS — Z1239 Encounter for other screening for malignant neoplasm of breast: Secondary | ICD-10-CM

## 2015-07-10 DIAGNOSIS — Z124 Encounter for screening for malignant neoplasm of cervix: Secondary | ICD-10-CM | POA: Diagnosis not present

## 2015-07-10 DIAGNOSIS — N952 Postmenopausal atrophic vaginitis: Secondary | ICD-10-CM | POA: Insufficient documentation

## 2015-07-10 DIAGNOSIS — R638 Other symptoms and signs concerning food and fluid intake: Secondary | ICD-10-CM

## 2015-07-10 DIAGNOSIS — Z90710 Acquired absence of both cervix and uterus: Secondary | ICD-10-CM | POA: Insufficient documentation

## 2015-07-10 DIAGNOSIS — Z0000 Encounter for general adult medical examination without abnormal findings: Secondary | ICD-10-CM

## 2015-07-10 DIAGNOSIS — Z01419 Encounter for gynecological examination (general) (routine) without abnormal findings: Secondary | ICD-10-CM

## 2015-07-10 DIAGNOSIS — R102 Pelvic and perineal pain: Secondary | ICD-10-CM | POA: Diagnosis not present

## 2015-07-10 LAB — POCT URINALYSIS DIPSTICK
Bilirubin, UA: NEGATIVE
Glucose, UA: NEGATIVE
Ketones, UA: NEGATIVE
Leukocytes, UA: NEGATIVE
Nitrite, UA: NEGATIVE
PROTEIN UA: NEGATIVE
RBC UA: NEGATIVE
SPECIFIC GRAVITY: 1.005
UROBILINOGEN UA: 0.2
pH, UA: 5

## 2015-07-10 NOTE — Progress Notes (Signed)
Patient ID: Danielle Delgado, female   DOB: [birth year], 65 y.o.   MRN: [id number] ANNUAL PREVENTATIVE CARE GYN  ENCOUNTER NOTE  Subjective:       Danielle Delgado is a 65 y.o. G47P0101 female here for a routine annual gynecologic exam.  Current complaints: 1.  Medicare breast and pelvic  Patient is 65yo G1P1, s/p TVH, postmenopausal, asymptomatic not on HRT, who presents today for annual breast and pelvic exam, with complaints of occasional stress incontinence, nocturia 1-3x most nights, and occasional vaginal dryness with intercourse.  Patient states she is doing well and has had no major medical events on interval between exams.     Gynecologic History No LMP recorded. Patient has had a hysterectomy. Contraception: status post hysterectomy Last Pap: not needed. Results were: wnl Last mammogram: 2016. Results were: normal  Obstetric History OB History  Gravida Para Term Preterm AB SAB TAB Ectopic Multiple Living  1 1  1      1     # Outcome Date GA Lbr Len/2nd Weight Sex Delivery Anes PTL Lv  1 Preterm    5 lb (2.268 kg)  Vag-Spont   Y      Past Medical History  Diagnosis Date  . Hypothyroidism   . Hypertension   . Asthma   . Hyperlipidemia   . Eczema   . Hiatus hernia syndrome   . Dyspareunia in female   . Superficial varicosities   . GERD (gastroesophageal reflux disease)   . Morbid obesity ([place])   . FH: colon cancer   . FH: breast cancer   . Postmenopausal   . Chronic vaginitis   . Vaginal atrophy   . Diabetes mellitus without complication ([place])   . Sleep apnea     Past Surgical History  Procedure Laterality Date  . Cholecystectomy    . Colonoscopy with propofol N/A 10/12/2014    Procedure: COLONOSCOPY WITH PROPOFOL;  Surgeon: Manya Silvas, MD;  Location: [facility] ENDOSCOPY;  Service: Endoscopy;  Laterality: N/A;  . Esophagogastroduodenoscopy (egd) with propofol N/A 10/12/2014    Procedure: ESOPHAGOGASTRODUODENOSCOPY (EGD) WITH PROPOFOL;  Surgeon: Manya Silvas, MD;   Location: [facility] ENDOSCOPY;  Service: Endoscopy;  Laterality: N/A;  . Breast excisional biopsy Right 1996  . Nasal sinus surgery    . Breast surgery      biopsy  . Vaginal hysterectomy  1982  . Dilation and curettage of uterus      Current Outpatient Prescriptions on File Prior to Visit  Medication Sig Dispense Refill  . ACCU-CHEK AVIVA PLUS test strip     . acetaminophen (TYLENOL) 500 MG tablet Take 1,000 mg by mouth every 6 (six) hours as needed.    Marland Kitchen albuterol (PROAIR HFA) 108 (90 BASE) MCG/ACT inhaler Inhale into the lungs.    Marland Kitchen aspirin 325 MG EC tablet Take 325 mg by mouth daily.    Marland Kitchen atorvastatin (LIPITOR) 10 MG tablet     . Biotin 5000 MCG TABS Take 1 tablet by mouth daily.    . calcium carbonate (OS-CAL) 600 MG TABS tablet Take 600 mg by mouth 2 (two) times daily with a meal.    . cetirizine (ZYRTEC) 10 MG tablet Take 10 mg by mouth daily.    . diclofenac sodium (VOLTAREN) 1 % GEL Apply 4 g topically 4 (four) times daily. 100 g 4  . ferrous sulfate 325 (65 FE) MG tablet Take 325 mg by mouth daily with breakfast.    . losartan-hydrochlorothiazide (HYZAAR) 50-12.5 MG per  tablet     . metFORMIN (GLUCOPHAGE) 1000 MG tablet Take by mouth.    . montelukast (SINGULAIR) 10 MG tablet Take by mouth.    . naproxen sodium (ANAPROX) 220 MG tablet Take 440 mg by mouth as needed.    . Omega-3 Fatty Acids (FISH OIL CONCENTRATE) 300 MG CAPS Take by mouth.    . ONGLYZA 5 MG TABS tablet     . pantoprazole (PROTONIX) 40 MG tablet     . PULMICORT FLEXHALER 90 MCG/ACT inhaler     . SYNTHROID 125 MCG tablet     . valsartan-hydrochlorothiazide (DIOVAN-HCT) 160-25 MG per tablet Take 1 tablet by mouth daily.     No current facility-administered medications on file prior to visit.    Allergies  Allergen Reactions  . Biaxin [Clarithromycin]   . Propulsid [Cisapride] Hives  . Sulfa Antibiotics Hives    Social History   Social History  . Marital Status: Married    Spouse Name: N/A  . Number of  Children: N/A  . Years of Education: N/A   Occupational History  . Not on file.   Social History Main Topics  . Smoking status: Never Smoker   . Smokeless tobacco: Not on file  . Alcohol Use: No  . Drug Use: No  . Sexual Activity: Yes   Other Topics Concern  . Not on file   Social History Narrative    Family History  Problem Relation Age of Onset  . Osteoporosis Mother   . Breast cancer Maternal Aunt   . Diabetes Maternal Grandmother   . Diabetes Maternal Grandfather   . Heart disease Maternal Grandfather   . Ovarian cancer Neg Hx   . Colon cancer Cousin     The following portions of the patient's history were reviewed and updated as appropriate: allergies, current medications, past family history, past medical history, past social history, past surgical history and problem list.  Review of Systems ROS Review of Systems - General ROS: negative for - chills, fatigue, fever, hot flashes, night sweats, weight loss + weight gain (13lbs) Psychological ROS: negative for - anxiety, decreased libido, depression, mood swings, physical abuse or sexual abuse Ophthalmic ROS: negative for - blurry vision, eye pain or loss of vision ENT ROS: negative for - headaches, hearing change, visual changes or vocal changes Allergy and Immunology ROS: negative for - hives, itchy/watery eyes or seasonal allergies Hematological and Lymphatic ROS: negative for - bleeding problems, bruising, swollen lymph nodes or weight loss Endocrine ROS: negative for - galactorrhea, hair pattern changes, hot flashes, malaise/lethargy, mood swings, palpitations, polydipsia/polyuria, skin changes, temperature intolerance or unexpected weight changes Breast ROS: negative for - new or changing breast lumps or nipple discharge Respiratory ROS: negative for - cough or shortness of breath Cardiovascular ROS: negative for - chest pain, irregular heartbeat, palpitations or shortness of breath Gastrointestinal ROS: no  abdominal pain, change in bowel habits, or black or bloody stools Genito-Urinary ROS: no dysuria, trouble voiding, or hematuria Musculoskeletal ROS: negative for - joint pain or joint stiffness,  Neurological ROS: negative for - bowel and bladder control changes Dermatological ROS: negative for rash and skin lesion changes   Objective:   BP 126/74 mmHg  Pulse 82  Ht 5\' 4"  (1.626 m)  Wt 242 lb (109.77 kg)  BMI 41.52 kg/m2 CONSTITUTIONAL: Well-developed, well-nourished obese female in no acute distress.  PSYCHIATRIC: Normal mood and affect. Normal behavior. Normal judgment and thought content. [place]: Alert and oriented to person, place, and time.  Normal muscle tone coordination. No cranial nerve deficit noted. HENT:  Normocephalic, atraumatic, External right and left ear normal. Oropharynx is clear and moist EYES: Conjunctivae and EOM are normal. Pupils are equal, round, and reactive to [place]. No scleral icterus.  NECK: Normal range of motion, supple, no masses.  Normal thyroid.  SKIN: Skin is warm and dry. No rash noted. Not diaphoretic. No erythema. No pallor. CARDIOVASCULAR: Normal heart rate noted, regular rhythm, no murmur, rubs or gallops. RESPIRATORY: Clear to auscultation bilaterally. Effort and breath sounds normal, no problems with respiration noted. BREASTS: Symmetric in size. 3cm pink plaque with irregular borders, 6 o'clock to nipple on left breasts. Otherwise No masses, nipple drainage, or lymphadenopathy. ABDOMEN: Soft, normal bowel sounds, no distention noted.  No tenderness, rebound or guarding.  BLADDER: Normal, nontender PELVIC:  External Genitalia: Normal  BUS: Normal  Vagina: minimal vaginal atrophy, good secretions, no lesions or masses noted, vaginal cuff intact and non-tender.  Cervix: surgically absent  Uterus: surgically absent  Adnexa: Normal  RV: External Exam NormaI, No Rectal Masses and Normal Sphincter tone  MUSCULOSKELETAL: Normal range of motion. No  tenderness.  No cyanosis, clubbing, or edema.  2+ distal pulses. LYMPHATIC: No Axillary, Supraclavicular, or Inguinal Adenopathy.    Assessment:   Annual gynecologic examination 65 y.o. Contraception: status post hysterectomy [id number] Problem List Items Addressed This Visit    None    Visit Diagnoses    Screening for breast cancer    -  Primary    Relevant Orders    MM DIGITAL SCREENING BILATERAL    Pelvic pain in female        Relevant Orders    Urine culture    POCT urinalysis dipstick (Completed)       Plan:  Pap: Not needed Mammogram: Ordered Stool Guaiac Testing:  colonoscopy 08/2014- wnl Labs: thru pcp Routine preventative health maintenance measures emphasized: Exercise/Diet/Weight control, Tobacco Warnings, Alcohol/Substance use risks and Stress Management  1. Discussed importance of diet and exercise to try prevent additional weight gain and lose the 13lbs gained this past year. 2. Offered vaginal estrogen cream to improved vaginal atrophy, patient denied medication at this time. 3. Return in 1 year for annual exam.   Danielle Delgado, Student-PA  Brayton Mars, MD   I have seen, interviewed, and examined the patient in conjunction with the [email or web address]. student and affirm the diagnosis and management plan. Alfard Cochrane A. Zeriyah Wain, MD, FACOG   Note: This dictation was prepared with Dragon dictation along with smaller phrase technology. Any transcriptional errors that result from this process are unintentional.

## 2015-07-10 NOTE — Patient Instructions (Addendum)
1. No Pap smear is needed. 2. Mammogram is ordered 3. Stool guaiac testing is not recommended because of recent colonoscopy 4. Continue with healthy eating and exercise with weight loss 5. Trial of estrogen vaginal cream 1/2 g intravaginal twice a week is declined 6. Recommend calcium and vitamin D supplementation 1200 mg daily 7. Return in 1 year for annual exam

## 2015-07-12 LAB — URINE CULTURE

## 2015-08-19 ENCOUNTER — Ambulatory Visit (INDEPENDENT_AMBULATORY_CARE_PROVIDER_SITE_OTHER): Payer: Medicare HMO | Admitting: Podiatry

## 2015-08-19 DIAGNOSIS — M7670 Peroneal tendinitis, unspecified leg: Secondary | ICD-10-CM

## 2015-08-19 DIAGNOSIS — E119 Type 2 diabetes mellitus without complications: Secondary | ICD-10-CM

## 2015-08-19 DIAGNOSIS — M7672 Peroneal tendinitis, left leg: Secondary | ICD-10-CM

## 2015-08-19 NOTE — Progress Notes (Signed)
She presents today for [place] to evaluate her diabetic shoes. They appear to be doing very good she says.  Objective: Vital signs are stable she is alert and oriented 3. No erythema edema saline as drainage or odor her shoes appear to be wearing very well there are no areas of erythema cellulitis drainage over or open lesions to the foot.  Assessment: Diabetes mellitus with good diabetic shoes.  Plan: Continue the use of these diabetic shoes and follow up with me in November.

## 2015-11-18 ENCOUNTER — Other Ambulatory Visit: Payer: Self-pay | Admitting: Obstetrics and Gynecology

## 2015-11-18 ENCOUNTER — Ambulatory Visit
Admission: RE | Admit: 2015-11-18 | Discharge: 2015-11-18 | Disposition: A | Discharge status: Home / Self Care | Payer: Medicare HMO | Source: Ambulatory Visit | Attending: Obstetrics and Gynecology | Admitting: Obstetrics and Gynecology

## 2015-11-18 DIAGNOSIS — Z1231 Encounter for screening mammogram for malignant neoplasm of breast: Secondary | ICD-10-CM | POA: Insufficient documentation

## 2015-11-18 DIAGNOSIS — Z1239 Encounter for other screening for malignant neoplasm of breast: Secondary | ICD-10-CM

## 2015-12-09 ENCOUNTER — Other Ambulatory Visit: Payer: Self-pay | Admitting: Internal Medicine

## 2015-12-09 DIAGNOSIS — R748 Abnormal levels of other serum enzymes: Secondary | ICD-10-CM

## 2015-12-13 ENCOUNTER — Ambulatory Visit: Payer: Medicare HMO

## 2015-12-16 ENCOUNTER — Ambulatory Visit
Admission: RE | Admit: 2015-12-16 | Discharge: 2015-12-16 | Disposition: A | Discharge status: Home / Self Care | Payer: Medicare HMO | Source: Ambulatory Visit | Attending: Internal Medicine | Admitting: Internal Medicine

## 2015-12-16 DIAGNOSIS — R748 Abnormal levels of other serum enzymes: Secondary | ICD-10-CM | POA: Insufficient documentation

## 2015-12-16 DIAGNOSIS — Z9049 Acquired absence of other specified parts of digestive tract: Secondary | ICD-10-CM | POA: Insufficient documentation

## 2015-12-16 DIAGNOSIS — E279 Disorder of adrenal gland, unspecified: Secondary | ICD-10-CM | POA: Diagnosis not present

## 2016-01-13 ENCOUNTER — Ambulatory Visit (INDEPENDENT_AMBULATORY_CARE_PROVIDER_SITE_OTHER): Payer: Medicare HMO | Admitting: Podiatry

## 2016-01-13 ENCOUNTER — Encounter: Payer: Self-pay | Admitting: Podiatry

## 2016-01-13 DIAGNOSIS — Q828 Other specified congenital malformations of skin: Secondary | ICD-10-CM

## 2016-01-13 DIAGNOSIS — E119 Type 2 diabetes mellitus without complications: Secondary | ICD-10-CM | POA: Diagnosis not present

## 2016-01-13 DIAGNOSIS — M7672 Peroneal tendinitis, left leg: Secondary | ICD-10-CM

## 2016-01-14 NOTE — Progress Notes (Signed)
She presents today for a follow-up of her diabetic foot. She like [place] to examine it. She also wanted to talk about diabetic shoes.  Objective: Vital signs are stable alert and oriented 3. Pulses are strongly palpable. Neurologic sensorium is intact. Degenerative reflexes are intact. Multiple hammertoe deformities are noted bilateral with porokeratotic lesions l beneath the fifth metatarsal bilateral. No open lesions or wounds. Muscle strength is strong and normal and symmetrical bilateral. Deep tendon reflexes are symmetrical bilateral. No open lesions or wounds are noted.  Assessment: Diabetes mellitus without complications. History of peroneal tendinitis of the left foot and leg non-complicated today. Porokeratosis bilateral.  Plan: Debrided reactive hyperkeratosis today. Suggested that she follow up with [place] at the beginning of the year for new shoes. I will fill out the paperwork she does not have to be present once we receive the return from the primary care provider she will have to come in for shoe measurement.

## 2016-04-03 ENCOUNTER — Telehealth: Payer: Self-pay | Admitting: Podiatry

## 2016-04-03 NOTE — Telephone Encounter (Signed)
Patient called wanting to start the process to get new diabetic shoes for the new year. She said she had an exam with Dr. Milinda Pointer at the end of last year but I still scheduled her an appointment on Monday 19 February at 3:45 pm. Will Dr. Milinda Pointer go ahead and do the paperwork or does she need to keep the appointment? If not, please contact the patient so we can cancel her appointment.

## 2016-04-14 ENCOUNTER — Telehealth: Payer: Self-pay | Admitting: Podiatry

## 2016-04-14 NOTE — Telephone Encounter (Signed)
Patient called and said that her last appt with Dr. Milinda Pointer in December 2017 she requested paperwork be started for new diabetic shoes with Dr. Rosario Jacks. Dr. Milinda Pointer told her to callback in early [id number] and they would have the nurse do the paperwork.  Pt was scheduled for Monday, but doesn't want to come in unless we have gotten all of her paperwork back. ADv her that we would send the paperwork to start with Dr. Rosario Jacks and then call her once we have it back./

## 2016-04-20 ENCOUNTER — Ambulatory Visit: Payer: Medicare HMO | Admitting: Podiatry

## 2016-04-23 NOTE — Telephone Encounter (Signed)
Patient's documentation is back from Dr. Francesca Oman office. She needs an appointment with Danielle Delgado to be measured for shoes and insoles.

## 2016-05-06 ENCOUNTER — Ambulatory Visit (INDEPENDENT_AMBULATORY_CARE_PROVIDER_SITE_OTHER): Payer: Medicare HMO | Admitting: Podiatry

## 2016-05-06 DIAGNOSIS — E119 Type 2 diabetes mellitus without complications: Secondary | ICD-10-CM

## 2016-05-06 DIAGNOSIS — M7672 Peroneal tendinitis, left leg: Secondary | ICD-10-CM

## 2016-05-20 NOTE — Progress Notes (Signed)
Measured for diabetic shoes and insoles today. Patient will follow up in 4 weeks for dispensing.  

## 2016-06-13 IMAGING — MG MM SCREENING BREAST TOMO BILATERAL
8 of 12 series · 8 of 28 positions shown · non-contrast
Comparison: Previous exam(s).

CLINICAL DATA: Screening.

EXAM:
DIGITAL SCREENING BILATERAL MAMMOGRAM WITH 3D TOMO WITH CAD

[R CC]
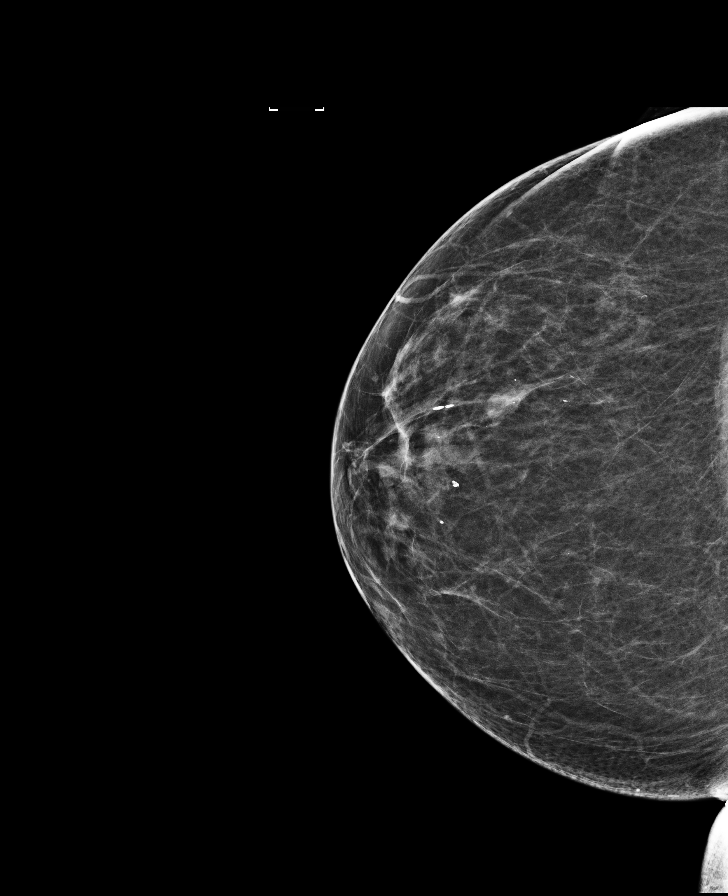

[R MLO synth-2D]
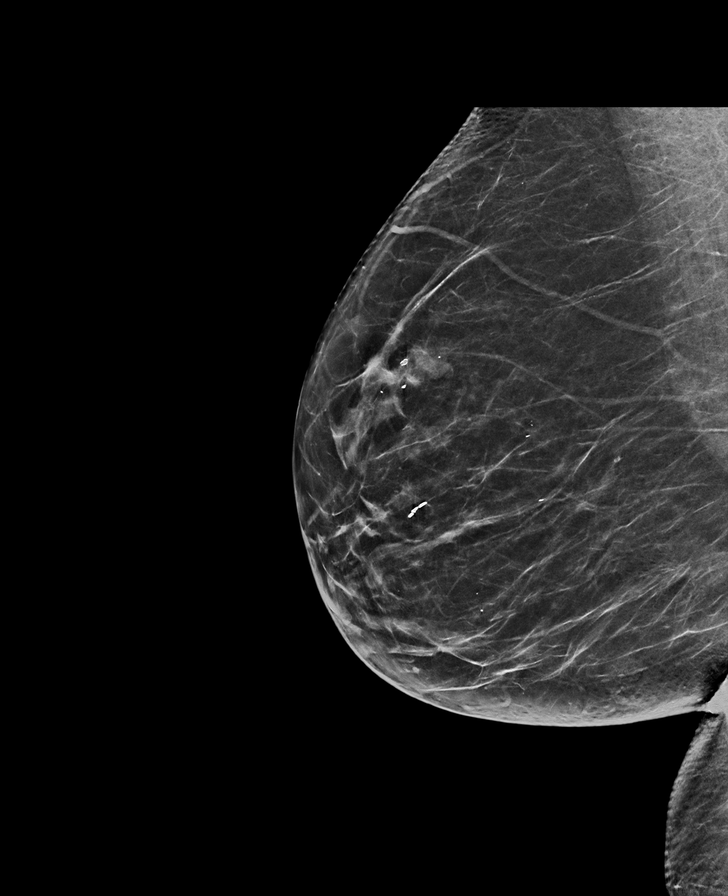

[L MLO]
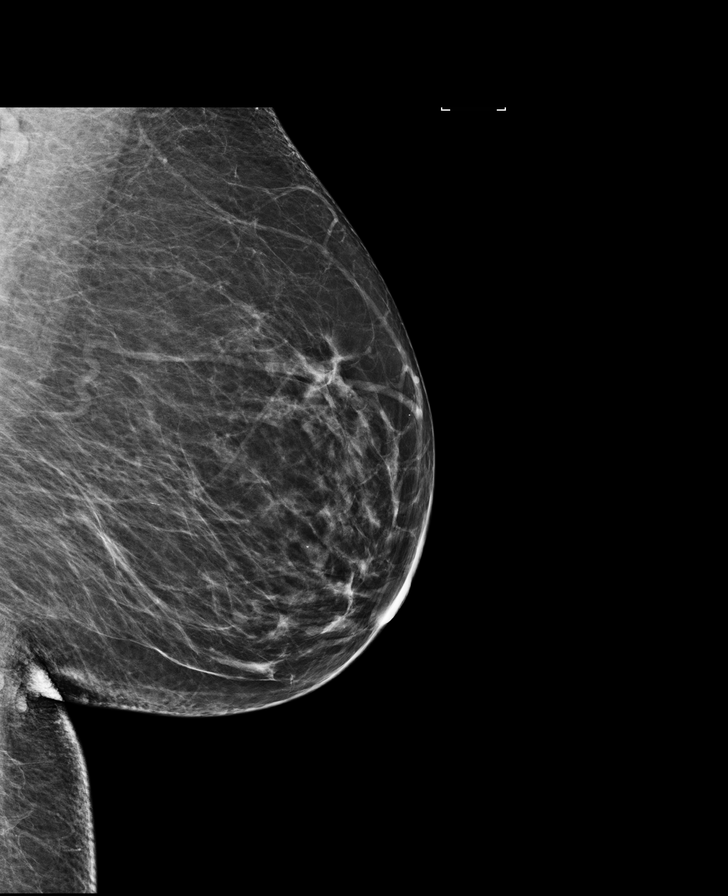

[R CC synth-2D]
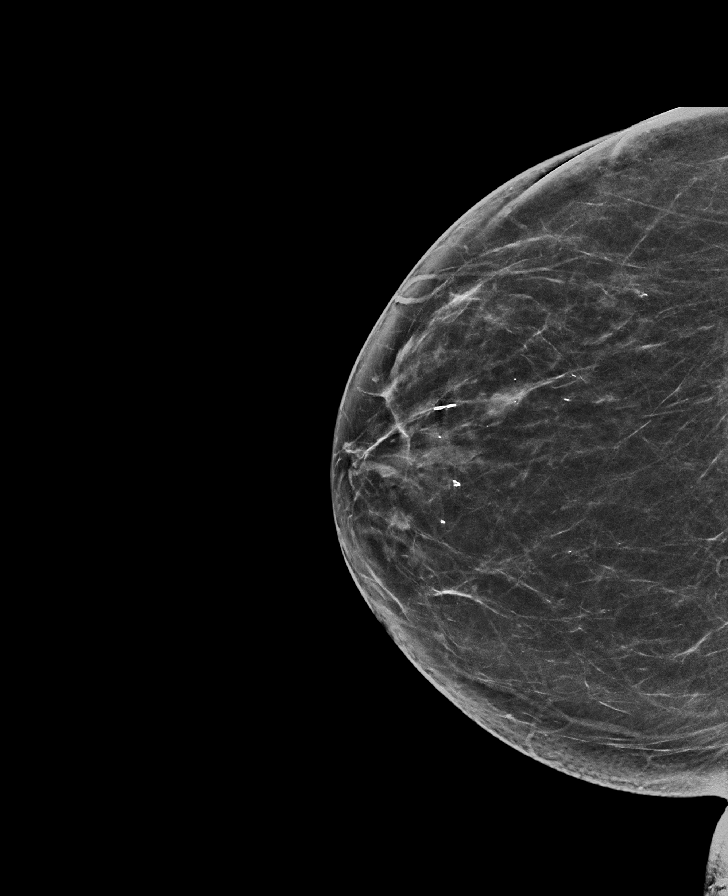

[L CC]
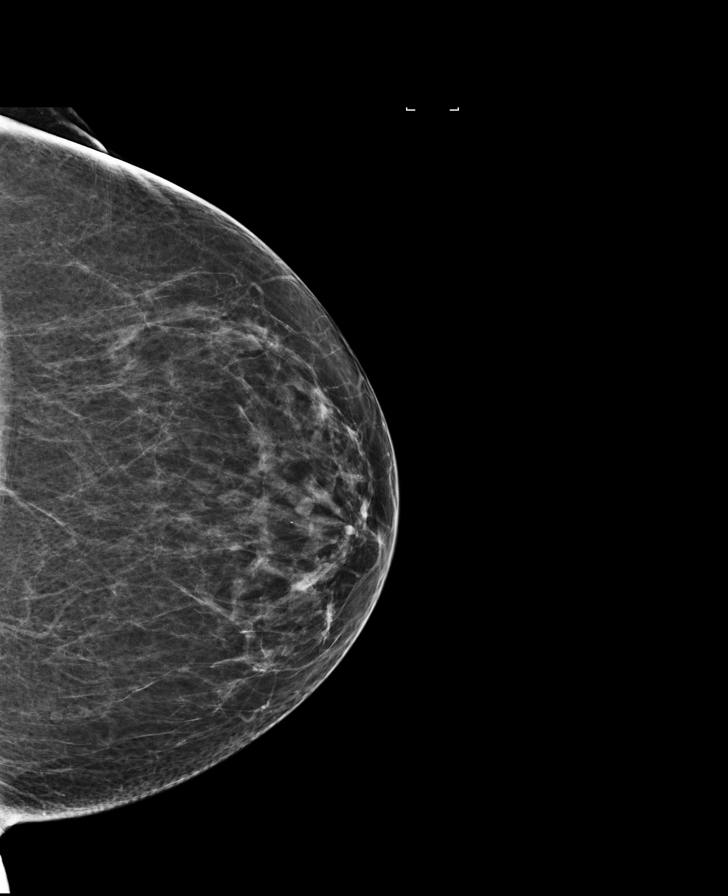

[R MLO]
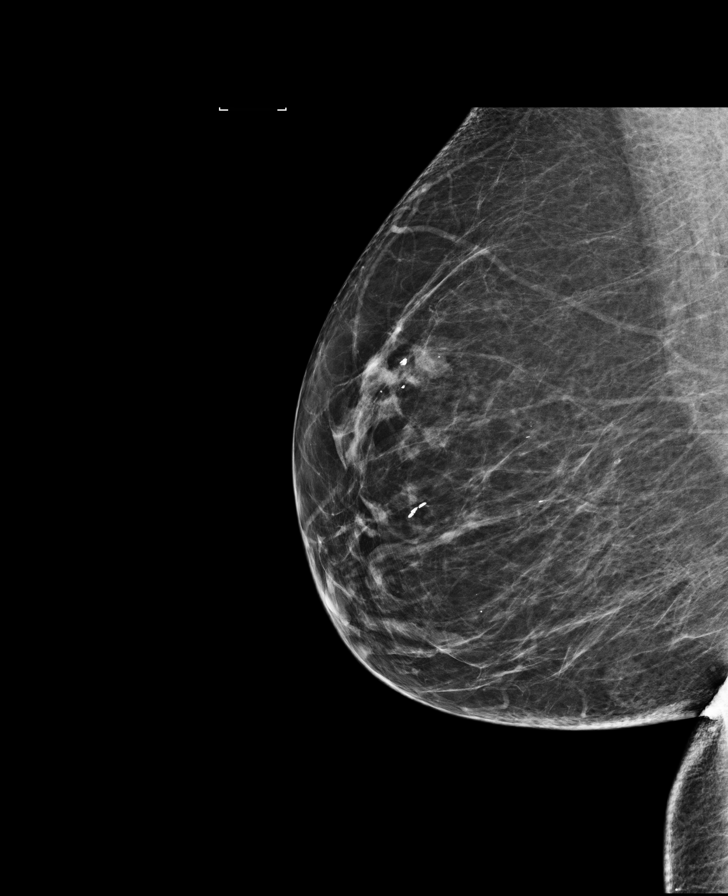

[L MLO synth-2D]
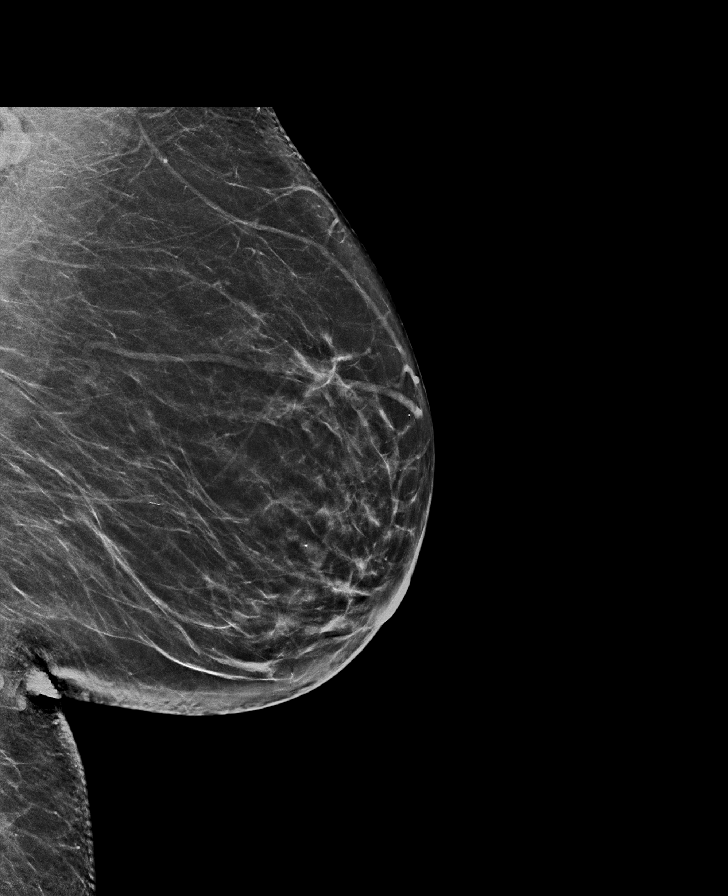

[L CC synth-2D]
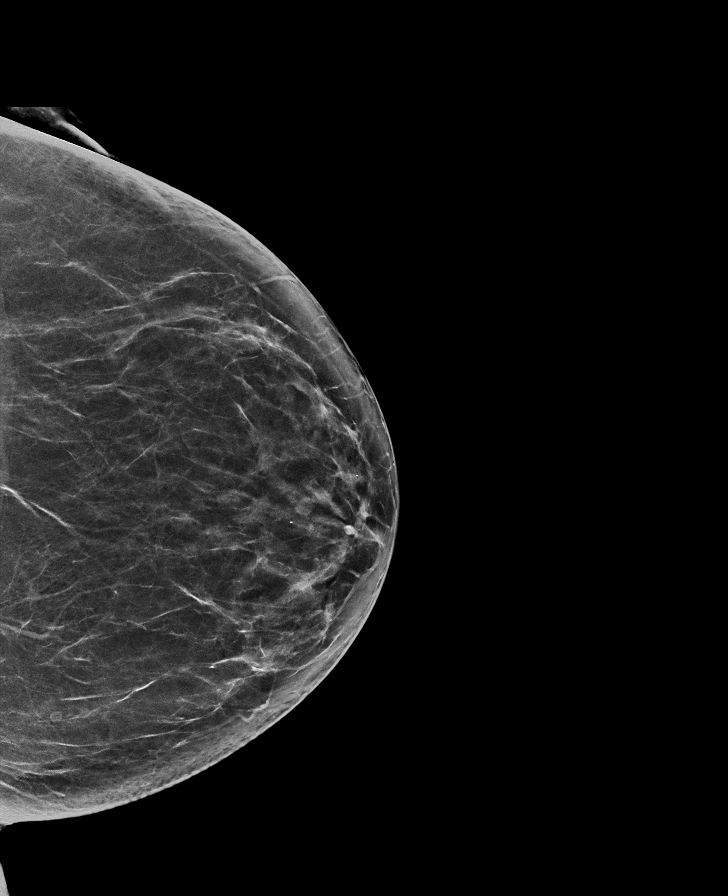

[8 of 28 positions shown; findings below may reference images not displayed]

ACR Breast Density Category b: There are scattered areas of
fibroglandular density.
FINDINGS: There are no findings suspicious for malignancy. Images were
processed with CAD.
IMPRESSION: No mammographic evidence of malignancy. A result letter of this
screening mammogram will be mailed directly to the patient.

RECOMMENDATION:
Screening mammogram in one year. (Code:55-L-23V)

BI-RADS CATEGORY  1: Negative.

## 2016-07-01 ENCOUNTER — Ambulatory Visit (INDEPENDENT_AMBULATORY_CARE_PROVIDER_SITE_OTHER): Payer: Medicare HMO | Admitting: Podiatry

## 2016-07-01 DIAGNOSIS — Q828 Other specified congenital malformations of skin: Secondary | ICD-10-CM | POA: Diagnosis not present

## 2016-07-01 DIAGNOSIS — M7672 Peroneal tendinitis, left leg: Secondary | ICD-10-CM | POA: Diagnosis not present

## 2016-07-01 DIAGNOSIS — M2041 Other hammer toe(s) (acquired), right foot: Secondary | ICD-10-CM

## 2016-07-01 DIAGNOSIS — E119 Type 2 diabetes mellitus without complications: Secondary | ICD-10-CM | POA: Diagnosis not present

## 2016-07-01 DIAGNOSIS — M2040 Other hammer toe(s) (acquired), unspecified foot: Secondary | ICD-10-CM

## 2016-07-01 NOTE — Progress Notes (Signed)
Patient picked up diabetic shoes that were ordered through [employer].   Patient wore the shoes in the office and seemed pleased with fit and function.   There wasn't any rear foot heel slippage and the custom F/O contoured to her foot (full contact) very well.  Patient was advised of breakin instructions, care, and warranty...  Codes to be dropped are A5000 x 2 and A5513 x 6

## 2016-07-10 NOTE — Progress Notes (Addendum)
Patient ID: Danielle Delgado, female   DOB: Nov 30, 1950, 66 y.o.   MRN: [id number] ANNUAL PREVENTATIVE CARE GYN  ENCOUNTER NOTE  Subjective:       Danielle Delgado is a 66 y.o. G61P0101 female here for a routine annual gynecologic exam.  Current complaints: 1.  Medicare breast and pelvic  Patient is 66yo G1P1, s/p TVH, postmenopausal, asymptomatic not on HRT, who presents today for annual breast and pelvic exam. Patient does report some mild vaginal dryness and irritation with intercourse. She does use K-Y jelly for lubrication.  Gynecologic History No LMP recorded. Patient has had a hysterectomy. Contraception: status post hysterectomy Last Pap: not needed. Results were: wnl Last mammogram: 11/2015 birad 1. Results were: normal  Obstetric History OB History  Gravida Para Term Preterm AB Living  1 1   1   1   SAB TAB Ectopic Multiple Live Births          1    # Outcome Date GA Lbr Len/2nd Weight Sex Delivery Anes PTL Lv  1 Preterm    5 lb (2.268 kg)  Vag-Spont   LIV      Past Medical History:  Diagnosis Date  . Asthma   . Chronic vaginitis   . Diabetes mellitus without complication ([place])   . Dyspareunia in female   . Eczema   . FH: breast cancer   . FH: colon cancer   . GERD (gastroesophageal reflux disease)   . Hiatus hernia syndrome   . Hyperlipidemia   . Hypertension   . Hypothyroidism   . Morbid obesity ([place])   . Postmenopausal   . Sleep apnea   . Superficial varicosities   . Vaginal atrophy     Past Surgical History:  Procedure Laterality Date  . BREAST EXCISIONAL BIOPSY Right 1996  . BREAST SURGERY     biopsy  . CHOLECYSTECTOMY    . COLONOSCOPY WITH PROPOFOL N/A 10/12/2014   Procedure: COLONOSCOPY WITH PROPOFOL;  Surgeon: Manya Silvas, MD;  Location: [facility] ENDOSCOPY;  Service: Endoscopy;  Laterality: N/A;  . DILATION AND CURETTAGE OF UTERUS    . ESOPHAGOGASTRODUODENOSCOPY (EGD) WITH PROPOFOL N/A 10/12/2014   Procedure: ESOPHAGOGASTRODUODENOSCOPY (EGD) WITH  PROPOFOL;  Surgeon: Manya Silvas, MD;  Location[facility] ENDOSCOPY;  Service: Endoscopy;  Laterality: N/A;  . NASAL SINUS SURGERY    . VAGINAL HYSTERECTOMY  1982    Current Outpatient Prescriptions on File Prior to Visit  Medication Sig Dispense Refill  . ACCU-CHEK AVIVA PLUS test strip     . acetaminophen (TYLENOL) 500 MG tablet Take 1,000 mg by mouth every 6 (six) hours as needed.    Marland Kitchen albuterol (PROAIR HFA) 108 (90 BASE) MCG/ACT inhaler Inhale into the lungs.    Marland Kitchen aspirin 325 MG EC tablet Take 325 mg by mouth daily.    Marland Kitchen atorvastatin (LIPITOR) 10 MG tablet     . Biotin 5000 MCG TABS Take 1 tablet by mouth daily.    . calcium carbonate (OS-CAL) 600 MG TABS tablet Take 600 mg by mouth 2 (two) times daily with a meal.    . cetirizine (ZYRTEC) 10 MG tablet Take 10 mg by mouth daily.    . diclofenac sodium (VOLTAREN) 1 % GEL Apply 4 g topically 4 (four) times daily. 100 g 4  . ferrous sulfate 325 (65 FE) MG tablet Take 325 mg by mouth daily with breakfast.    . losartan-hydrochlorothiazide (HYZAAR) 50-12.5 MG per tablet     . metFORMIN (GLUCOPHAGE) 1000  MG tablet Take by mouth.    . montelukast (SINGULAIR) 10 MG tablet Take by mouth.    . naproxen sodium (ANAPROX) 220 MG tablet Take 440 mg by mouth as needed.    . Omega-3 Fatty Acids (FISH OIL CONCENTRATE) 300 MG CAPS Take by mouth.    . ONGLYZA 5 MG TABS tablet     . pantoprazole (PROTONIX) 40 MG tablet     . PULMICORT FLEXHALER 90 MCG/ACT inhaler     . SYNTHROID 125 MCG tablet     . valsartan-hydrochlorothiazide (DIOVAN-HCT) 160-25 MG per tablet Take 1 tablet by mouth daily.     No current facility-administered medications on file prior to visit.     Allergies  Allergen Reactions  . Biaxin [Clarithromycin]   . Propulsid [Cisapride] Hives  . Sulfa Antibiotics Hives    Social History   Social History  . Marital status: Married    Spouse name: N/A  . Number of children: N/A  . Years of education: N/A   Occupational History   . Not on file.   Social History Main Topics  . Smoking status: Never Smoker  . Smokeless tobacco: Never Used  . Alcohol use No  . Drug use: No  . Sexual activity: Yes   Other Topics Concern  . Not on file   Social History Narrative  . No narrative on file    Family History  Problem Relation Age of Onset  . Osteoporosis Mother   . Breast cancer Maternal Aunt 60  . Diabetes Maternal Grandmother   . Diabetes Maternal Grandfather   . Heart disease Maternal Grandfather   . Colon cancer Cousin   . Breast cancer Cousin 76       mat cousin  . Ovarian cancer Neg Hx     The following portions of the patient's history were reviewed and updated as appropriate: allergies, current medications, past family history, past medical history, past social history, past surgical history and problem list.  Review of Systems Review of Systems  Constitutional:       No vasomotor symptoms  HENT: Negative.   Respiratory: Negative.   Cardiovascular: Negative.   Gastrointestinal: Negative.   Genitourinary:       Vaginal dryness Discomfort with intercourse present  Musculoskeletal: Negative.   Skin: Negative.   Neurological: Negative.   Endo/Heme/Allergies: Negative.   Psychiatric/Behavioral: Negative.     Objective:   BP 130/73   Pulse 82   Ht 5\' 4"  (1.626 m)   Wt 242 lb 6.4 oz (110 kg)   BMI 41.61 kg/m  CONSTITUTIONAL: Well-developed, well-nourished obese female in no acute distress.  PSYCHIATRIC: Normal mood and affect. Normal behavior. Normal judgment and thought content. [place]: Alert and oriented to person, place, and time. Normal muscle tone coordination. No cranial nerve deficit noted. HENT:  Normocephalic, atraumatic, External right and left ear normal. Oropharynx is clear and moist EYES: Conjunctivae and EOM are normal. Pupils are equal, round, and reactive to [place]. No scleral icterus.  NECK: Normal range of motion, supple, no masses.  Normal thyroid.  SKIN: Skin is  warm and dry. No rash noted. Not diaphoretic. No erythema. No pallor. CARDIOVASCULAR: Normal heart rate noted, regular rhythm, no murmur, rubs or gallops. RESPIRATORY: Clear to auscultation bilaterally. Effort and breath sounds normal, no problems with respiration noted. BREASTS: Symmetric in size. 3cm pink plaque with irregular borders, 6 o'clock to nipple on left breasts. Otherwise No masses, nipple drainage, or lymphadenopathy. ABDOMEN: Soft, normal bowel sounds, no  distention noted.  No tenderness, rebound or guarding.  BLADDER: Normal, nontender PELVIC:  External Genitalia: Normal  BUS: Normal  Vagina: Mild vaginal atrophy, good secretions, no lesions or masses noted, vaginal cuff intact and non-tender.  Cervix: surgically absent  Uterus: surgically absent  Adnexa: Normal  RV: External Exam NormaI, No Rectal Masses and Normal Sphincter tone  MUSCULOSKELETAL: Normal range of motion. No tenderness.  No cyanosis, clubbing, or edema.  2+ distal pulses. LYMPHATIC: No Axillary, Supraclavicular, or Inguinal Adenopathy.    Assessment:   Annual gynecologic examination 66 y.o. Contraception: status post hysterectomy [id number] Problem List Items Addressed This Visit    Increased BMI   Vaginal atrophy   Status post vaginal hysterectomy    Other Visit Diagnoses    Well woman exam    -  Primary   Screening for breast cancer          Plan:  Pap: Not needed Mammogram: Ordered Stool Guaiac Testing:  ordered Labs: thru pcp Routine preventative health maintenance measures emphasized: Exercise/Diet/Weight control, Tobacco Warnings, Alcohol/Substance use risks and Stress Management  1. Discussed importance of diet and exercise. 2. Offered vaginal estrogen cream to improved vaginal atrophy;Prescription for Premarin cream 1/2 g intravaginal twice a week given 3. Over-the-counter lubricants discussed. Recommendations:  Astroglide  Jo H2O lubricant  Coconut oil  Virgin olive oil 4. Return  in 1 year for annual exam.   Joyice Faster, CMA  Brayton Mars, MD  Note: This dictation was prepared with Dragon dictation along with smaller phrase technology. Any transcriptional errors that result from this process are unintentional.

## 2016-07-14 ENCOUNTER — Encounter: Payer: Self-pay | Admitting: Obstetrics and Gynecology

## 2016-07-14 ENCOUNTER — Ambulatory Visit (INDEPENDENT_AMBULATORY_CARE_PROVIDER_SITE_OTHER): Payer: Medicare HMO | Admitting: Obstetrics and Gynecology

## 2016-07-14 VITALS — BP 130/73 | HR 82 | Ht 64.0 in | Wt 242.4 lb

## 2016-07-14 DIAGNOSIS — Z01419 Encounter for gynecological examination (general) (routine) without abnormal findings: Secondary | ICD-10-CM

## 2016-07-14 DIAGNOSIS — R638 Other symptoms and signs concerning food and fluid intake: Secondary | ICD-10-CM | POA: Diagnosis not present

## 2016-07-14 DIAGNOSIS — N952 Postmenopausal atrophic vaginitis: Secondary | ICD-10-CM | POA: Diagnosis not present

## 2016-07-14 DIAGNOSIS — Z90710 Acquired absence of both cervix and uterus: Secondary | ICD-10-CM

## 2016-07-14 DIAGNOSIS — Z1239 Encounter for other screening for malignant neoplasm of breast: Secondary | ICD-10-CM

## 2016-07-14 DIAGNOSIS — Z1211 Encounter for screening for malignant neoplasm of colon: Secondary | ICD-10-CM

## 2016-07-14 DIAGNOSIS — Z1231 Encounter for screening mammogram for malignant neoplasm of breast: Secondary | ICD-10-CM

## 2016-07-14 MED ORDER — ESTROGENS, CONJUGATED 0.625 MG/GM VA CREA: TOPICAL_CREAM | VAGINAL | 1 refills | Status: DC

## 2016-07-14 NOTE — Patient Instructions (Signed)
1. No Pap smear is needed 2. Mammogram is ordered 3. Guaiac cards are given for colon cancer screening 4. Calcium with vitamin D supplementation is encouraged 5. Healthy eating and exercise is to be continued 6. Begin a trial Premarin cream intravaginal 1/2 g 2 times a week for vaginal atrophy symptoms 7. Over-the-counter lubricants include:  Astroglide  Jo H2O lubricant  Coconut oil  Virgin olive oil 8. Return in 1 year for annual exam   Health Maintenance for Postmenopausal Women Menopause is a normal process in which your reproductive ability comes to an end. This process happens gradually over a span of months to years, usually between the ages of 31 and 62. Menopause is complete when you have missed 12 consecutive menstrual periods. It is important to talk with your health care provider about some of the most common conditions that affect postmenopausal women, such as heart disease, cancer, and bone loss (osteoporosis). Adopting a healthy lifestyle and getting preventive care can help to promote your health and wellness. Those actions can also lower your chances of developing some of these common conditions. What should I know about menopause? During menopause, you may experience a number of symptoms, such as:  Moderate-to-severe hot flashes.  Night sweats.  Decrease in sex drive.  Mood swings.  Headaches.  Tiredness.  Irritability.  Memory problems.  Insomnia. Choosing to treat or not to treat menopausal changes is an individual decision that you make with your health care provider. What should I know about hormone replacement therapy and supplements? Hormone therapy products are effective for treating symptoms that are associated with menopause, such as hot flashes and night sweats. Hormone replacement carries certain risks, especially as you become older. If you are thinking about using estrogen or estrogen with progestin treatments, discuss the benefits and risks with  your health care provider. What should I know about heart disease and stroke? Heart disease, heart attack, and stroke become more likely as you age. This may be due, in part, to the hormonal changes that your body experiences during menopause. These can affect how your body processes dietary fats, triglycerides, and cholesterol. Heart attack and stroke are both medical emergencies. There are many things that you can do to help prevent heart disease and stroke:  Have your blood pressure checked at least every 1-2 years. High blood pressure causes heart disease and increases the risk of stroke.  If you are 58-44 years old, ask your health care provider if you should take aspirin to prevent a heart attack or a stroke.  Do not use any tobacco products, including cigarettes, chewing tobacco, or electronic cigarettes. If you need help quitting, ask your health care provider.  It is important to eat a healthy diet and maintain a healthy weight.  Be sure to include plenty of vegetables, fruits, low-fat dairy products, and lean protein.  Avoid eating foods that are high in solid fats, added sugars, or salt (sodium).  Get regular exercise. This is one of the most important things that you can do for your health.  Try to exercise for at least 150 minutes each week. The type of exercise that you do should increase your heart rate and make you sweat. This is known as moderate-intensity exercise.  Try to do strengthening exercises at least twice each week. Do these in addition to the moderate-intensity exercise.  Know your numbers.Ask your health care provider to check your cholesterol and your blood glucose. Continue to have your blood tested as directed by  your health care provider. What should I know about cancer screening? There are several types of cancer. Take the following steps to reduce your risk and to catch any cancer development as early as possible. Breast Cancer  Practice breast  self-awareness.  This means understanding how your breasts normally appear and feel.  It also means doing regular breast self-exams. Let your health care provider know about any changes, no matter how small.  If you are 52 or older, have a clinician do a breast exam (clinical breast exam or CBE) every year. Depending on your age, family history, and medical history, it may be recommended that you also have a yearly breast X-ray (mammogram).  If you have a family history of breast cancer, talk with your health care provider about genetic screening.  If you are at high risk for breast cancer, talk with your health care provider about having an MRI and a mammogram every year.  Breast cancer (BRCA) gene test is recommended for women who have family members with BRCA-related cancers. Results of the assessment will determine the need for genetic counseling and BRCA1 and for BRCA2 testing. BRCA-related cancers include these types:  Breast. This occurs in males or females.  Ovarian.  Tubal. This may also be called fallopian tube cancer.  Cancer of the abdominal or pelvic lining (peritoneal cancer).  Prostate.  Pancreatic. Cervical, Uterine, and Ovarian Cancer  Your health care provider may recommend that you be screened regularly for cancer of the pelvic organs. These include your ovaries, uterus, and vagina. This screening involves a pelvic exam, which includes checking for microscopic changes to the surface of your cervix (Pap test).  For women ages 21-65, health care providers may recommend a pelvic exam and a Pap test every three years. For women ages 8-65, they may recommend the Pap test and pelvic exam, combined with testing for human papilloma virus (HPV), every five years. Some types of HPV increase your risk of cervical cancer. Testing for HPV may also be done on women of any age who have unclear Pap test results.  Other health care providers may not recommend any screening for  nonpregnant women who are considered low risk for pelvic cancer and have no symptoms. Ask your health care provider if a screening pelvic exam is right for you.  If you have had past treatment for cervical cancer or a condition that could lead to cancer, you need Pap tests and screening for cancer for at least 20 years after your treatment. If Pap tests have been discontinued for you, your risk factors (such as having a new sexual partner) need to be reassessed to determine if you should start having screenings again. Some women have medical problems that increase the chance of getting cervical cancer. In these cases, your health care provider may recommend that you have screening and Pap tests more often.  If you have a family history of uterine cancer or ovarian cancer, talk with your health care provider about genetic screening.  If you have vaginal bleeding after reaching menopause, tell your health care provider.  There are currently no reliable tests available to screen for ovarian cancer. Lung Cancer  Lung cancer screening is recommended for adults 80-98 years old who are at high risk for lung cancer because of a history of smoking. A yearly low-dose CT scan of the lungs is recommended if you:  Currently smoke.  Have a history of at least 30 pack-years of smoking and you currently smoke or have  quit within the past 15 years. A pack-year is smoking an average of one pack of cigarettes per day for one year. Yearly screening should:  Continue until it has been 15 years since you quit.  Stop if you develop a health problem that would prevent you from having lung cancer treatment. Colorectal Cancer  This type of cancer can be detected and can often be prevented.  Routine colorectal cancer screening usually begins at age 33 and continues through age 107.  If you have risk factors for colon cancer, your health care provider may recommend that you be screened at an earlier age.  If you have  a family history of colorectal cancer, talk with your health care provider about genetic screening.  Your health care provider may also recommend using home test kits to check for hidden blood in your stool.  A small camera at the end of a tube can be used to examine your colon directly (sigmoidoscopy or colonoscopy). This is done to check for the earliest forms of colorectal cancer.  Direct examination of the colon should be repeated every 5-10 years until age 93. However, if early forms of precancerous polyps or small growths are found or if you have a family history or genetic risk for colorectal cancer, you may need to be screened more often. Skin Cancer  Check your skin from head to toe regularly.  Monitor any moles. Be sure to tell your health care provider:  About any new moles or changes in moles, especially if there is a change in a mole's shape or color.  If you have a mole that is larger than the size of a pencil eraser.  If any of your family members has a history of skin cancer, especially at a young age, talk with your health care provider about genetic screening.  Always use sunscreen. Apply sunscreen liberally and repeatedly throughout the day.  Whenever you are outside, protect yourself by wearing long sleeves, pants, a wide-brimmed hat, and sunglasses. What should I know about osteoporosis? Osteoporosis is a condition in which bone destruction happens more quickly than new bone creation. After menopause, you may be at an increased risk for osteoporosis. To help prevent osteoporosis or the bone fractures that can happen because of osteoporosis, the following is recommended:  If you are 46-62 years old, get at least 1,000 mg of calcium and at least 600 mg of vitamin D per day.  If you are older than age 60 but younger than age 48, get at least 1,200 mg of calcium and at least 600 mg of vitamin D per day.  If you are older than age 39, get at least 1,200 mg of calcium and  at least 800 mg of vitamin D per day. Smoking and excessive alcohol intake increase the risk of osteoporosis. Eat foods that are rich in calcium and vitamin D, and do weight-bearing exercises several times each week as directed by your health care provider. What should I know about how menopause affects my mental health? Depression may occur at any age, but it is more common as you become older. Common symptoms of depression include:  Low or sad mood.  Changes in sleep patterns.  Changes in appetite or eating patterns.  Feeling an overall lack of motivation or enjoyment of activities that you previously enjoyed.  Frequent crying spells. Talk with your health care provider if you think that you are experiencing depression. What should I know about immunizations? It is important that you  get and maintain your immunizations. These include:  Tetanus, diphtheria, and pertussis (Tdap) booster vaccine.  Influenza every year before the flu season begins.  Pneumonia vaccine.  Shingles vaccine. Your health care provider may also recommend other immunizations. This information is not intended to replace advice given to you by your health care provider. Make sure you discuss any questions you have with your health care provider. Document Released: 04/10/2005 Document Revised: 09/06/2015 Document Reviewed: 11/20/2014 Elsevier Interactive Patient Education  2017 [employer].

## 2016-08-02 LAB — FECAL OCCULT BLOOD, IMMUNOCHEMICAL: Fecal Occult Bld: NEGATIVE

## 2016-10-28 ENCOUNTER — Ambulatory Visit: Payer: Medicare HMO | Admitting: Podiatry

## 2016-11-23 ENCOUNTER — Ambulatory Visit
Admission: RE | Admit: 2016-11-23 | Discharge: 2016-11-23 | Disposition: A | Discharge status: Home / Self Care | Payer: Medicare HMO | Source: Ambulatory Visit | Attending: Obstetrics and Gynecology | Admitting: Obstetrics and Gynecology

## 2016-11-23 DIAGNOSIS — Z1231 Encounter for screening mammogram for malignant neoplasm of breast: Secondary | ICD-10-CM | POA: Insufficient documentation

## 2016-11-23 DIAGNOSIS — Z1239 Encounter for other screening for malignant neoplasm of breast: Secondary | ICD-10-CM

## 2017-01-13 ENCOUNTER — Encounter: Payer: Self-pay | Admitting: Podiatry

## 2017-01-13 ENCOUNTER — Ambulatory Visit: Payer: Medicare HMO | Admitting: Podiatry

## 2017-01-13 DIAGNOSIS — I872 Venous insufficiency (chronic) (peripheral): Secondary | ICD-10-CM | POA: Diagnosis not present

## 2017-01-13 DIAGNOSIS — E119 Type 2 diabetes mellitus without complications: Secondary | ICD-10-CM | POA: Diagnosis not present

## 2017-01-13 NOTE — Progress Notes (Signed)
She presents today for her yearly diabetic checkup. She states that she is doing very well that she does have some pain across top of her feet and her distal legs. She states that she does get some swelling around the varicosities as she point to her ankle. She states that her hemoglobin A1c is 6.8.  Objective: Vital signs are stable she is alert and oriented 3. Pulses are palpable. Neurologic sensorium is intact. Mild hammertoe deformities are noted bilateral muscle strength is noted to be perfect bilaterally. No open lesions or Wednesday skin breakdown.  Assessment:Well-controlled diabetes without signs or symptoms of complications. She does have venous insufficiency which we are going to have evaluated by the vascular root.  Plan: We are going to request vascular surgery evaluate her for venous insufficiency and treatment plan. She will notify us once her calendar year rolls around for diabetic shoes.

## 2017-03-01 ENCOUNTER — Encounter (INDEPENDENT_AMBULATORY_CARE_PROVIDER_SITE_OTHER): Payer: Self-pay | Admitting: Vascular Surgery

## 2017-03-01 ENCOUNTER — Ambulatory Visit (INDEPENDENT_AMBULATORY_CARE_PROVIDER_SITE_OTHER): Payer: Medicare HMO | Admitting: Vascular Surgery

## 2017-03-01 ENCOUNTER — Ambulatory Visit (INDEPENDENT_AMBULATORY_CARE_PROVIDER_SITE_OTHER): Payer: Medicare HMO

## 2017-03-01 DIAGNOSIS — I872 Venous insufficiency (chronic) (peripheral): Secondary | ICD-10-CM | POA: Diagnosis not present

## 2017-03-01 DIAGNOSIS — I890 Lymphedema, not elsewhere classified: Secondary | ICD-10-CM | POA: Diagnosis not present

## 2017-03-02 ENCOUNTER — Encounter (INDEPENDENT_AMBULATORY_CARE_PROVIDER_SITE_OTHER): Payer: Self-pay | Admitting: Vascular Surgery

## 2017-03-02 DIAGNOSIS — I890 Lymphedema, not elsewhere classified: Secondary | ICD-10-CM | POA: Insufficient documentation

## 2017-03-02 DIAGNOSIS — I872 Venous insufficiency (chronic) (peripheral): Secondary | ICD-10-CM | POA: Insufficient documentation

## 2017-03-02 NOTE — Progress Notes (Signed)
MRN : [id number]  Danielle Delgado is a 67 y.o. (November 15, 1950) female who presents with chief complaint of  Chief Complaint  Patient presents with  . New Patient (Initial Visit)    Bilateral Venous study f/u  .  History of Present Illness: Patient is seen for evaluation of leg swelling. The patient first noticed the swelling remotely but is now concerned because of a significant increase in the overall edema. The swelling is associated with pain and discoloration. The patient notes that in the morning the legs are significantly improved but they steadily worsened throughout the course of the day. Elevation makes the legs better, dependency makes them much worse.   There is no history of ulcerations associated with the swelling.   The patient denies any recent changes in their medications.  The patient has not been wearing graduated compression.  The patient has no had any past angiography, interventions or vascular surgery.  The patient denies a history of DVT or PE. There is no prior history of phlebitis. There is no history of primary lymphedema.  There is no history of radiation treatment to the groin or pelvis No history of malignancies. No history of trauma or groin or pelvic surgery. No history of foreign travel or parasitic infections area    Current Meds  Medication Sig  . ACCU-CHEK AVIVA PLUS test strip   . acetaminophen (TYLENOL) 500 MG tablet Take 1,000 mg by mouth every 6 (six) hours as needed.  Marland Kitchen albuterol (PROAIR HFA) 108 (90 BASE) MCG/ACT inhaler Inhale into the lungs.  Marland Kitchen amoxicillin (AMOXIL) 250 MG capsule   . aspirin 325 MG EC tablet Take 81 mg by mouth daily.   Marland Kitchen atorvastatin (LIPITOR) 10 MG tablet   . Biotin 5000 MCG TABS Take 1 tablet by mouth daily.  . calcium carbonate (OS-CAL) 600 MG TABS tablet Take 600 mg by mouth 2 (two) times daily with a meal.  . Calcium Carbonate-Vitamin D (OYSTER SHELL CALCIUM 500 + D) 500-125 MG-UNIT TABS Take by mouth.  .  cetirizine (ZYRTEC) 10 MG tablet Take 10 mg by mouth daily.  Marland Kitchen conjugated estrogens (PREMARIN) vaginal cream 1/2 gram intravaginal 2 times a week  . diclofenac sodium (VOLTAREN) 1 % GEL Apply 4 g topically 4 (four) times daily.  . ferrous sulfate 325 (65 FE) MG tablet Take 325 mg by mouth daily with breakfast.  . glipiZIDE (GLUCOTROL XL) 5 MG 24 hr tablet Take 5 mg by mouth daily with breakfast.  . levocetirizine (XYZAL) 5 MG tablet levocetirizine 5 mg tablet  . losartan-hydrochlorothiazide (HYZAAR) 50-12.5 MG per tablet   . metFORMIN (GLUCOPHAGE) 1000 MG tablet Take by mouth.  . mometasone (ASMANEX 30 METERED DOSES) 220 MCG/INH inhaler Inhale into the lungs.  . montelukast (SINGULAIR) 10 MG tablet Take by mouth.  . Omega-3 Fatty Acids (FISH OIL CONCENTRATE) 300 MG CAPS Take by mouth.  . pantoprazole (PROTONIX) 40 MG tablet   . PULMICORT FLEXHALER 90 MCG/ACT inhaler   . SYNTHROID 125 MCG tablet     Past Medical History:  Diagnosis Date  . Asthma   . Chronic vaginitis   . Diabetes mellitus without complication ([place])   . Dyspareunia in female   . Eczema   . FH: breast cancer   . FH: colon cancer   . GERD (gastroesophageal reflux disease)   . Hiatus hernia syndrome   . Hyperlipidemia   . Hypertension   . Hypothyroidism   . Morbid obesity ([place])   . Postmenopausal   .  Sleep apnea   . Superficial varicosities   . Vaginal atrophy     Past Surgical History:  Procedure Laterality Date  . BREAST EXCISIONAL BIOPSY Right 1996  . BREAST SURGERY     biopsy  . CHOLECYSTECTOMY    . COLONOSCOPY WITH PROPOFOL N/A 10/12/2014   Procedure: COLONOSCOPY WITH PROPOFOL;  Surgeon: Manya Silvas, MD;  Location: [facility] ENDOSCOPY;  Service: Endoscopy;  Laterality: N/A;  . DILATION AND CURETTAGE OF UTERUS    . ESOPHAGOGASTRODUODENOSCOPY (EGD) WITH PROPOFOL N/A 10/12/2014   Procedure: ESOPHAGOGASTRODUODENOSCOPY (EGD) WITH PROPOFOL;  Surgeon: Manya Silvas, MD;  Location[facility] END[facility]COPY;  Service:  Endoscopy;  Laterality: N/A;  . NASAL SINUS SURGERY    . VAGINAL HYSTERECTOMY  1982    Social History Social History   Tobacco Use  . Smoking status: Never Smoker  . Smokeless tobacco: Never Used  Substance Use Topics  . Alcohol use: No    Alcohol/week: 0.0 oz  . Drug use: No    Family History Family History  Problem Relation Age of Onset  . Osteoporosis Mother   . Breast cancer Maternal Aunt 60  . Diabetes Maternal Grandmother   . Diabetes Maternal Grandfather   . Heart disease Maternal Grandfather   . Colon cancer Cousin   . Breast cancer Cousin 35       mat cousin  . Ovarian cancer Neg Hx   No family history of bleeding/clotting disorders, porphyria or autoimmune disease   Allergies  Allergen Reactions  . Biaxin [Clarithromycin]   . Propulsid [Cisapride] Hives  . Sulfa Antibiotics Hives     REVIEW OF SYSTEMS (Negative unless checked)  Constitutional: [] Weight loss  [] Fever  [] Chills Cardiac: [] Chest pain   [] Chest pressure   [] Palpitations   [] Shortness of breath when laying flat   [] Shortness of breath with exertion. Vascular:  [] Pain in legs with walking   [] Pain in legs at rest  [] History of DVT   [] Phlebitis   [x] Swelling in legs   [x] Varicose veins   [] Non-healing ulcers Pulmonary:   [] Uses home oxygen   [] Productive cough   [] Hemoptysis   [] Wheeze  [] COPD   [] Asthma Neurologic:  [] Dizziness   [] Seizures   [] History of stroke   [] History of TIA  [] Aphasia   [] Vissual changes   [] Weakness or numbness in arm   [] Weakness or numbness in leg Musculoskeletal:   [] Joint swelling   [] Joint pain   [] Low back pain Hematologic:  [] Easy bruising  [] Easy bleeding   [] Hypercoagulable state   [] Anemic Gastrointestinal:  [] Diarrhea   [] Vomiting  [] Gastroesophageal reflux/heartburn   [] Difficulty swallowing. Genitourinary:  [] Chronic kidney disease   [] Difficult urination  [] Frequent urination   [] Blood in urine Skin:  [] Rashes   [] Ulcers  Psychological:  [] History of  anxiety   []  History of major depression.  Physical Examination  Vitals:   03/01/17 1455  BP: (!) 165/85  Pulse: 69  Resp: 17  Weight: 240 lb (108.9 kg)  Height: 5\' 3"  (1.6 m)   Body mass index is 42.51 kg/m. Gen: WD/WN, NAD Head: [place]/AT, No temporalis wasting.  Ear/Nose/Throat: Hearing grossly intact, nares w/o erythema or drainage, poor dentition Eyes: PER, EOMI, sclera nonicteric.  Neck: Supple, no masses.  No bruit or JVD.  Pulmonary:  Good air movement, clear to auscultation bilaterally, no use of accessory muscles.  Cardiac: RRR, normal S1, S2, no Murmurs. Vascular: scattered varicosities present bilaterally.  Mild venous stasis changes to the legs bilaterally.  3+ soft pitting  edema Vessel Right Left  Radial Palpable Palpable  PT Palpable Palpable  DP Palpable Palpable   Gastrointestinal: soft, non-distended. No guarding/no peritoneal signs.  Musculoskeletal: M/S 5/5 throughout.  No deformity or atrophy.  Neurologic: CN 2-12 intact. Pain and [place] touch intact in extremities.  Symmetrical.  Speech is fluent. Motor exam as listed above. Psychiatric: Judgment intact, Mood & affect appropriate for pt's clinical situation. Dermatologic: No rashes or ulcers noted.  No changes consistent with cellulitis. Lymph : No Cervical lymphadenopathy, no lichenification or skin changes of chronic lymphedema.  CBC Lab Results  Component Value Date   WBC 11.4 (H) 11/06/2014   HGB 11.8 (L) 11/06/2014   HCT 35.5 11/06/2014   MCV 89.3 11/06/2014   PLT 284 11/06/2014    BMET No results found for: NA, K, CL, CO2, GLUCOSE, BUN, CREATININE, CALCIUM, GFRNONAA, GFRAA CrCl cannot be calculated (No order found.).  COAG No results found for: INR, PROTIME  Radiology No results found.  Assessment/Plan 1. Chronic venous insufficiency No surgery or intervention at this point in time.  I have reviewed my discussion with the patient regarding venous insufficiency and why it causes  symptoms. I have discussed with the patient the chronic skin changes that accompany venous insufficiency and the long term sequela such as ulceration. Patient will contnue wearing graduated compression stockings on a daily basis, as this has provided excellent control of his edema. The patient will put the stockings on first thing in the morning and removing them in the evening. The patient is reminded not to sleep in the stockings.  In addition, behavioral modification including elevation during the day will be initiated. Exercise is strongly encouraged.  Duplex ultrasound of the bilateral lower extremity shows deep venous reflux bilaterally with superficial insufficiency on the right  Given the patient's good control and lack of any problems regarding the venous insufficiency and lymphedema a lymph pump in not need at this time.  The patient will follow up with me PRN should anything change.  The patient voices agreement with this plan.   2. Lymphedema No surgery or intervention at this point in time.  I have reviewed my discussion with the patient regarding venous insufficiency and why it causes symptoms. I have discussed with the patient the chronic skin changes that accompany venous insufficiency and the long term sequela such as ulceration. Patient will contnue wearing graduated compression stockings on a daily basis, as this has provided excellent control of his edema. The patient will put the stockings on first thing in the morning and removing them in the evening. The patient is reminded not to sleep in the stockings.  In addition, behavioral modification including elevation during the day will be initiated. Exercise is strongly encouraged.  Given the patient's good control and lack of any problems regarding the venous insufficiency and lymphedema a lymph pump in not need at this time.  The patient will follow up with me PRN should anything change.  The patient voices agreement with this  plan.     Hortencia Pilar, MD  03/02/2017 3:45 PM

## 2017-03-15 ENCOUNTER — Telehealth: Payer: Self-pay | Admitting: *Deleted

## 2017-03-15 ENCOUNTER — Other Ambulatory Visit: Payer: Self-pay | Admitting: Internal Medicine

## 2017-03-15 NOTE — Telephone Encounter (Signed)
-----   Message from Garrel Ridgel, [place] sent at 03/13/2017  7:47 AM EST ----- She should have a consult with a vascular guys for this venous insufficiency.  Please see that she does.

## 2017-03-15 NOTE — Telephone Encounter (Signed)
Unable to leave a message answering machine states memory is full.

## 2017-03-16 ENCOUNTER — Encounter: Payer: Self-pay | Admitting: Internal Medicine

## 2017-03-16 ENCOUNTER — Ambulatory Visit: Payer: Medicare HMO | Admitting: Internal Medicine

## 2017-03-16 VITALS — BP 140/77 | HR 73 | Resp 16 | Ht 64.0 in | Wt 241.4 lb

## 2017-03-16 DIAGNOSIS — G4733 Obstructive sleep apnea (adult) (pediatric): Secondary | ICD-10-CM | POA: Diagnosis not present

## 2017-03-16 DIAGNOSIS — J301 Allergic rhinitis due to pollen: Secondary | ICD-10-CM | POA: Diagnosis not present

## 2017-03-16 DIAGNOSIS — J4520 Mild intermittent asthma, uncomplicated: Secondary | ICD-10-CM

## 2017-03-16 DIAGNOSIS — K219 Gastro-esophageal reflux disease without esophagitis: Secondary | ICD-10-CM | POA: Diagnosis not present

## 2017-03-16 DIAGNOSIS — Z9989 Dependence on other enabling machines and devices: Secondary | ICD-10-CM

## 2017-03-16 NOTE — Progress Notes (Signed)
[facility] [place], [place] [address]  Pulmonary Sleep Medicine  Office Visit Note  Patient Name: Danielle Delgado DOB: March 05, 1950 MRN [id number]  Date of Service: 03/16/2017  Complaints/HPI:  Patient is here for follow-up obstructive sleep apnea-hypopnea syndrome.  Basically doing fairly well Has been using the auto Pap.  Her only complaint is the mask leaving a mark on her face.  She has had no sinus problems.  She has had no infections.  Little bit of throat clearing noted and some reflux every now and then.  Denies any cough at this time.  No chest congestion noted.  ROS  General: (-) fever, (-) chills, (-) night sweats, (-) weakness Skin: (-) rashes, (-) itching,. Eyes: (-) visual changes, (-) redness, (-) itching. Nose and Sinuses: (-) nasal stuffiness or itchiness, (-) postnasal drip, (-) nosebleeds, (-) sinus trouble. Mouth and Throat: (-) sore throat, (-) hoarseness. Neck: (-) swollen glands, (-) enlarged thyroid, (-) neck pain. Respiratory:   No cough, (-) bloody sputum,  no shortness of breath,  no wheezing. Cardiovascular:  no ankle swelling, (-) chest pain. Lymphatic: (-) lymph node enlargement. Neurologic: (-) numbness, (-) tingling. Psychiatric: (-) anxiety, (-) depression   Current Medication: Outpatient Encounter Medications as of 03/16/2017  Medication Sig Note  . ACCU-CHEK AVIVA PLUS test strip  01/17/2014: Received from: External Pharmacy  . acetaminophen (TYLENOL) 500 MG tablet Take 1,000 mg by mouth every 6 (six) hours as needed.   Marland Kitchen albuterol (PROAIR HFA) 108 (90 BASE) MCG/ACT inhaler Inhale into the lungs. 11/06/2014: Received from: [facility]  . amoxicillin (AMOXIL) 250 MG capsule    . aspirin 325 MG EC tablet Take 81 mg by mouth daily.    Marland Kitchen atorvastatin (LIPITOR) 10 MG tablet  01/17/2014: Received from: External Pharmacy  . Biotin 5000 MCG TABS Take 1 tablet by mouth daily.   . calcium carbonate (OS-CAL) 600 MG  TABS tablet Take 600 mg by mouth 2 (two) times daily with a meal.   . Calcium Carbonate-Vitamin D (OYSTER SHELL CALCIUM 500 + D) 500-125 MG-UNIT TABS Take by mouth.   . cetirizine (ZYRTEC) 10 MG tablet Take 10 mg by mouth daily.   Marland Kitchen conjugated estrogens (PREMARIN) vaginal cream 1/2 gram intravaginal 2 times a week   . diclofenac sodium (VOLTAREN) 1 % GEL Apply 4 g topically 4 (four) times daily.   . ferrous sulfate 325 (65 FE) MG tablet Take 325 mg by mouth daily with breakfast.   . glipiZIDE (GLUCOTROL XL) 5 MG 24 hr tablet Take 5 mg by mouth daily with breakfast.   . levocetirizine (XYZAL) 5 MG tablet levocetirizine 5 mg tablet   . losartan-hydrochlorothiazide (HYZAAR) 50-12.5 MG per tablet  01/17/2014: Received from: External Pharmacy  . metFORMIN (GLUCOPHAGE) 1000 MG tablet Take by mouth. 11/06/2014: Received from: [facility]  . mometasone (ASMANEX 30 METERED DOSES) 220 MCG/INH inhaler Inhale into the lungs.   . montelukast (SINGULAIR) 10 MG tablet Take by mouth. 11/06/2014: Received from: [facility]  . naproxen sodium (ANAPROX) 220 MG tablet Take 440 mg by mouth as needed.   . Omega-3 Fatty Acids (FISH OIL CONCENTRATE) 300 MG CAPS Take by mouth. 11/06/2014: Received from: [facility]  . pantoprazole (PROTONIX) 40 MG tablet  01/17/2014: Received from: External Pharmacy  . PULMICORT FLEXHALER 90 MCG/ACT inhaler  01/17/2014: Received from: External Pharmacy  . SYNTHROID 125 MCG tablet  01/17/2014: Received from: External Pharmacy  No facility-administered encounter medications on file as of 03/16/2017.     Surgical History: Past Surgical History:  Procedure Laterality Date  . BREAST EXCISIONAL BIOPSY Right 1996  . BREAST SURGERY     biopsy  . CHOLECYSTECTOMY    . COLONOSCOPY WITH PROPOFOL N/A 10/12/2014   Procedure: COLONOSCOPY WITH PROPOFOL;  Surgeon: Manya Silvas, MD;  Location: [facility] ENDOSCOPY;  Service: Endoscopy;  Laterality:  N/A;  . DILATION AND CURETTAGE OF UTERUS    . ESOPHAGOGASTRODUODENOSCOPY (EGD) WITH PROPOFOL N/A 10/12/2014   Procedure: ESOPHAGOGASTRODUODENOSCOPY (EGD) WITH PROPOFOL;  Surgeon: Manya Silvas, MD;  Location: [facility] ENDOSCOPY;  Service: Endoscopy;  Laterality: N/A;  . NASAL SINUS SURGERY    . VAGINAL HYSTERECTOMY  1982    Medical History: Past Medical History:  Diagnosis Date  . Asthma   . Chronic vaginitis   . Diabetes mellitus without complication ([place])   . Dyspareunia in female   . Eczema   . FH: breast cancer   . FH: colon cancer   . GERD (gastroesophageal reflux disease)   . Hiatus hernia syndrome   . Hyperlipidemia   . Hypertension   . Hypothyroidism   . Morbid obesity ([place])   . Postmenopausal   . Sleep apnea   . Superficial varicosities   . Vaginal atrophy     Family History: Family History  Problem Relation Age of Onset  . Osteoporosis Mother   . Breast cancer Maternal Aunt 60  . Diabetes Maternal Grandmother   . Diabetes Maternal Grandfather   . Heart disease Maternal Grandfather   . Colon cancer Cousin   . Breast cancer Cousin 44       mat cousin  . Ovarian cancer Neg Hx     Social History: Social History   Socioeconomic History  . Marital status: Married    Spouse name: Not on file  . Number of children: Not on file  . Years of education: Not on file  . Highest education level: Not on file  Social Needs  . Financial resource strain: Not on file  . Food insecurity - worry: Not on file  . Food insecurity - inability: Not on file  . Transportation needs - medical: Not on file  . Transportation needs - non-medical: Not on file  Occupational History  . Not on file  Tobacco Use  . Smoking status: Never Smoker  . Smokeless tobacco: Never Used  Substance and Sexual Activity  . Alcohol use: No    Alcohol/week: 0.0 oz  . Drug use: No  . Sexual activity: Not Currently  Other Topics Concern  . Not on file  Social History Narrative  . Not on file     Vital Signs: Blood pressure 140/77, pulse 73, resp. rate 16, height 5\' 4"  (1.626 m), weight 241 lb 6.4 oz (109.5 kg), SpO2 96 %.  Examination: General Appearance: The patient is well-developed, well-nourished, and in no distress. Skin: Gross inspection of skin unremarkable. Head: normocephalic, no gross deformities. Eyes: no gross deformities noted. ENT: ears appear grossly normal no exudates. Neck: Supple. No thyromegaly. No LAD. Respiratory:  Good aeration noted bilaterally no rhonchi or rales are noted.. Cardiovascular: Normal S1 and S2 without murmur or rub. Extremities: No cyanosis. pulses are equal. Neurologic: Alert and oriented. No involuntary movements.  LABS: No results found for this or any previous visit (from the past 2160 hour(s)).  Radiology: Mm Screening Breast Tomo Bilateral  Result Date: 11/24/2016 CLINICAL DATA:  Screening. EXAM: 2D DIGITAL SCREENING  BILATERAL MAMMOGRAM WITH CAD AND ADJUNCT TOMO COMPARISON:  Previous exam(s). ACR Breast Density Category b: There are scattered areas of fibroglandular density. FINDINGS: There are no findings suspicious for malignancy. Images were processed with CAD. IMPRESSION: No mammographic evidence of malignancy. A result letter of this screening mammogram will be mailed directly to the patient. RECOMMENDATION: Screening mammogram in one year. (Code:SM-B-01Y) BI-RADS CATEGORY  1: Negative. Electronically Signed   By: Lajean Manes M.D.   On: 11/24/2016 08:45    No results found.  No results found.    Assessment and Plan: Patient Active Problem List   Diagnosis Date Noted  . Chronic venous insufficiency 03/02/2017  . Lymphedema 03/02/2017  . Increased BMI 07/10/2015  . Pelvic pain in female 07/10/2015  . Vaginal atrophy 07/10/2015  . Status post vaginal hysterectomy 07/10/2015    1. ASthma   Under good control We will continue with present management.   She will be on her inhalers as prescribed   Follow-up  pulmonary functions were ordered  2. OSA  she is on auto Pap which will be continued She has had good down loads.  3. Allergic rhinitis  mostly seasonal appears to be controlled at this time  4. Obesity  discussed dietary and weight loss control  5. GERD  she is controlled at this time.   General Counseling: I have discussed the findings of the evaluation and examination with Mariann Laster.  I have also discussed any further diagnostic evaluation thatmay be needed or ordered today. Kassiah verbalizes understanding of the findings of todays visit. We also reviewed her medications today and discussed drug interactions and side effects including but not limited excessive drowsiness and altered mental states. We also discussed that there is always a risk not just to her but also people around her. she has been encouraged to call the office with any questions or concerns that should arise related to todays visit.    Time spent: 32min  I have personally obtained a history, examined the patient, evaluated laboratory and imaging results, formulated the assessment and plan and placed orders.    Allyne Gee, MD [facility] Pulmonary and Critical Care Sleep medicine

## 2017-03-16 NOTE — Patient Instructions (Signed)

## 2017-03-22 ENCOUNTER — Ambulatory Visit: Payer: Medicare HMO | Admitting: Podiatry

## 2017-03-22 ENCOUNTER — Encounter: Payer: Self-pay | Admitting: Podiatry

## 2017-03-22 DIAGNOSIS — L03119 Cellulitis of unspecified part of limb: Secondary | ICD-10-CM

## 2017-03-22 DIAGNOSIS — L02619 Cutaneous abscess of unspecified foot: Secondary | ICD-10-CM

## 2017-03-22 MED ORDER — DOXYCYCLINE HYCLATE 100 MG PO TABS: 100.0000 mg | ORAL_TABLET | Freq: Two times a day (BID) | ORAL | 0 refills | Status: DC

## 2017-03-22 NOTE — Progress Notes (Signed)
She presents today with a chief complaint of pain and redness to her hallux right foot.  She states that she woke up about 3 days ago and noticed a crack between her first and second toes.  She states it was painful at the time but there is no redness.  She says this has turned into a blistered area and has turned red and the redness has moved almost up to my ankle.  She denies fever chills nausea vomiting muscle aches and pains states that appear to be a crack but no insect bite.  She states that she is a diabetic so she is naturally concerned.  Objective: Vital signs are stable alert and oriented x3.  Pulses are palpable.  Neurologic sensorium is intact.  Deep tendon reflexes are intact.  Muscle strength is normal symmetrical bilateral.  Orthopedic evaluation and x-rays all joints distal to the ankle full range of motion without crepitation.  Cutaneous evaluation demonstrates supple well-hydrated cutis that she does have a sending cellulitis to the level of the navicular cuneiform joint which appears to be originated from the first interspace.  There is an abscess that is present there wants lanced today after sterile Betadine skin prep resulted in copious amounts of whitish pinkish purulence.  There is no malodor associated with this.  A sample was taken to be sent for culture and sensitivity.  The area was flushed clean and a dry sterile compressive dressing was placed.  Assessment: Diabetes mellitus with abscess right foot cellulitis.  No systemic signs of infection at this point.  Plan: After the incision and drainage today the area was marked with a sharpie marker to make sure that cellulitis does not extend past this area.  It is dated 03/22/2017.  I started her on doxycycline and she will start soaking in Epsom salts and warm water twice daily and apply this dressing.  Also placed her in a Darco shoe.  She was given both oral and written home-going instructions.  Should she develop a fever chest pain  shortness of breath calf pain or the ascending cellulitis move past the marked areas she is to notify the emergency department at the local hospital.  I will follow-up with her in 1 week otherwise.

## 2017-03-24 ENCOUNTER — Ambulatory Visit: Payer: Self-pay | Admitting: Internal Medicine

## 2017-03-24 ENCOUNTER — Telehealth: Payer: Self-pay | Admitting: Podiatry

## 2017-03-24 LAB — WOUND CULTURE: Organism ID, Bacteria: NONE SEEN

## 2017-03-24 NOTE — Telephone Encounter (Signed)
As long as the redness did not extend past the line I drew, we are good.  It will turn a little darker because the inflammation is going down and the infection is resolving.

## 2017-03-24 NOTE — Telephone Encounter (Signed)
Returned patient call and informed her of Dr. Marisa Cyphers recommendations and if redness goes beyond the line, she should call our office immediately.  She stated that her foot is doing some better today and she will keep an eye on the are.

## 2017-03-29 ENCOUNTER — Encounter: Payer: Self-pay | Admitting: Podiatry

## 2017-03-29 ENCOUNTER — Ambulatory Visit: Payer: Medicare HMO | Admitting: Podiatry

## 2017-03-29 DIAGNOSIS — L02619 Cutaneous abscess of unspecified foot: Secondary | ICD-10-CM

## 2017-03-29 DIAGNOSIS — L03119 Cellulitis of unspecified part of limb: Secondary | ICD-10-CM

## 2017-03-29 NOTE — Progress Notes (Signed)
She presents today for follow-up of her abscess right foot.  She denies fever chills nausea vomiting states that she is doing so much better than she was previously.  States that she still has 2 days of antibiotic and may have developed some type of rash along her abdomen but it really does not bother her.  She states that she has not been soaking the foot lately but has been keeping it dry while she takes a shower.  She continues to wear her surgical shoe.  And she continues to dress her toe every day.  Objective: Vital signs are stable alert and oriented x3.  Pulses are palpable.  There is no erythema cellulitis drainage or odor.  I debrided all necrotic skin today.  I see no source of drainage though her dressing did have a small amount of drainage on it.  This looks more like a insect bite than any type of regular skin breakdown.  Culture was negative no white cells no organisms seen.  Venous insufficiency study did demonstrate that she had some proximal venous insufficiency.  Assessment: Well-healing abscess.  Venous insufficiency.  Plan: At this point I highly recommend she continue to bandage I am allow her to shower with an antibacterial soap.  She will keep it dry and clean and follow-up with me in 2 weeks.  She will complete her antibiotics.

## 2017-04-14 ENCOUNTER — Ambulatory Visit: Payer: Medicare HMO | Admitting: Podiatry

## 2017-04-21 ENCOUNTER — Ambulatory Visit: Payer: Self-pay | Admitting: Internal Medicine

## 2017-04-28 ENCOUNTER — Encounter: Payer: Self-pay | Admitting: Podiatry

## 2017-04-28 ENCOUNTER — Ambulatory Visit (INDEPENDENT_AMBULATORY_CARE_PROVIDER_SITE_OTHER): Payer: Medicare HMO | Admitting: Podiatry

## 2017-04-28 DIAGNOSIS — L03119 Cellulitis of unspecified part of limb: Secondary | ICD-10-CM

## 2017-04-28 DIAGNOSIS — L02619 Cutaneous abscess of unspecified foot: Secondary | ICD-10-CM

## 2017-04-28 NOTE — Progress Notes (Signed)
She presents today for follow-up of cellulitis and abscess to the first interdigital space of the right foot.  She states that she is doing much better is much improved the toe still little bit red particularly after showers.  She is also is requesting new diabetic shoes.  Objective: Vital signs are stable alert and oriented x3.  Pulses are palpable.  Neurologic sensorium slightly diminished per Semmes Weinstein monofilament.  Mild edema bilateral.  Mild hammertoe deformities HAV deformities.  No erythema cellulitis drainage or odor cellulitis is completely resolved.  Assessment: 100% resolved cellulitis diabetes.  Plan: She is measured for diabetic shoes today we will forward her information to her primary care provider.

## 2017-05-12 ENCOUNTER — Ambulatory Visit: Payer: Self-pay | Admitting: Internal Medicine

## 2017-05-19 ENCOUNTER — Ambulatory Visit: Payer: Medicare HMO | Admitting: Orthotics

## 2017-05-19 ENCOUNTER — Ambulatory Visit (INDEPENDENT_AMBULATORY_CARE_PROVIDER_SITE_OTHER): Payer: Medicare HMO | Admitting: Orthotics

## 2017-05-19 DIAGNOSIS — M2040 Other hammer toe(s) (acquired), unspecified foot: Secondary | ICD-10-CM

## 2017-05-19 DIAGNOSIS — I872 Venous insufficiency (chronic) (peripheral): Secondary | ICD-10-CM

## 2017-05-19 DIAGNOSIS — E119 Type 2 diabetes mellitus without complications: Secondary | ICD-10-CM

## 2017-05-19 DIAGNOSIS — M7672 Peroneal tendinitis, left leg: Secondary | ICD-10-CM

## 2017-05-19 NOTE — Progress Notes (Signed)

## 2017-05-26 ENCOUNTER — Ambulatory Visit: Payer: Medicare HMO | Admitting: Internal Medicine

## 2017-05-26 DIAGNOSIS — J4520 Mild intermittent asthma, uncomplicated: Secondary | ICD-10-CM | POA: Diagnosis not present

## 2017-06-10 NOTE — Procedures (Signed)
[place], [address]  DATE OF SERVICE:  May 26, 2017  Complete Pulmonary Function Testing Interpretation:  FINDINGS:   the forced vital capacity is normal the FEV1 is normal FEV1 FVC ratio is mildly decreased.  Post bronchodilator there is no significant improvement in the FEV1 however clinical improvement may occur in the absence of spirometric improvement and does not preclude the use of bronchodilators.  Total lung capacity is normal by body box residual volume is normal residual volume total lung capacity ratio is increased FRC is normal with DLCO is normal  IMPRESSION:   S pulmonary function study is within normal limits. There is no significant improvement after bronchodilators however clinical improvement may still occur after use of bronchodilators DLCO is normal  Allyne Gee, MD [facility] Pulmonary Critical Care Medicine Sleep Medicine

## 2017-07-12 NOTE — Progress Notes (Signed)
Patient ID: Danielle Delgado, female   DOB: Jul 30, 1950, 67 y.o.   MRN: [id number] ANNUAL PREVENTATIVE CARE GYN  ENCOUNTER NOTE  Subjective:       Danielle Delgado is a 67 y.o. G88P0101 female here for a routine annual gynecologic exam.  Current complaints: 1.  Medicare breast and pelvic  Patient is 67yo G1P1, s/p TVH, postmenopausal, asymptomatic not on HRT, who presents today for annual breast and pelvic exam, with complaints of occasional stress incontinence, nocturia 1-2x most nights, and occasional vaginal dryness with intercourse.  Patient states she is doing well.  Patient had cellulitis in the toes which had to be treated with oral antibiotics recently.  Chronic arthritis issues are impairing regular exercise.   Gynecologic History No LMP recorded. Patient has had a hysterectomy. Contraception: status post hysterectomy Last Pap: not needed. Results were: wnl Last mammogram: 11/24/2016 birad 1 Results were: normal  Obstetric History OB History  Gravida Para Term Preterm AB Living  1 1   1   1   SAB TAB Ectopic Multiple Live Births          1    # Outcome Date GA Lbr Len/2nd Weight Sex Delivery Anes PTL Lv  1 Preterm    5 lb (2.268 kg)  Vag-Spont   LIV    Past Medical History:  Diagnosis Date  . Asthma   . Chronic vaginitis   . Diabetes mellitus without complication ([place])   . Dyspareunia in female   . Eczema   . FH: breast cancer   . FH: colon cancer   . GERD (gastroesophageal reflux disease)   . Hiatus hernia syndrome   . Hyperlipidemia   . Hypertension   . Hypothyroidism   . Morbid obesity ([place])   . Postmenopausal   . Sleep apnea   . Superficial varicosities   . Vaginal atrophy     Past Surgical History:  Procedure Laterality Date  . BREAST EXCISIONAL BIOPSY Right 1996  . BREAST SURGERY     biopsy  . CHOLECYSTECTOMY    . COLONOSCOPY WITH PROPOFOL N/A 10/12/2014   Procedure: COLONOSCOPY WITH PROPOFOL;  Surgeon: Manya Silvas, MD;  Location: [facility] ENDOSCOPY;   Service: Endoscopy;  Laterality: N/A;  . DILATION AND CURETTAGE OF UTERUS    . ESOPHAGOGASTRODUODENOSCOPY (EGD) WITH PROPOFOL N/A 10/12/2014   Procedure: ESOPHAGOGASTRODUODENOSCOPY (EGD) WITH PROPOFOL;  Surgeon: Manya Silvas, MD;  Location: [facility] ENDOSCOPY;  Service: Endoscopy;  Laterality: N/A;  . NASAL SINUS SURGERY    . VAGINAL HYSTERECTOMY  1982    Current Outpatient Medications on File Prior to Visit  Medication Sig Dispense Refill  . ACCU-CHEK AVIVA PLUS test strip     . acetaminophen (TYLENOL) 500 MG tablet Take 1,000 mg by mouth every 6 (six) hours as needed.    Marland Kitchen albuterol (PROAIR HFA) 108 (90 BASE) MCG/ACT inhaler Inhale into the lungs.    Marland Kitchen aspirin 325 MG EC tablet Take 81 mg by mouth daily.     Marland Kitchen atorvastatin (LIPITOR) 10 MG tablet     . Biotin 5000 MCG TABS Take 1 tablet by mouth daily.    . calcium carbonate (OS-CAL) 600 MG TABS tablet Take 600 mg by mouth 2 (two) times daily with a meal.    . Calcium Carbonate-Vitamin D (OYSTER SHELL CALCIUM 500 + D) 500-125 MG-UNIT TABS Take by mouth.    . cetirizine (ZYRTEC) 10 MG tablet Take 10 mg by mouth daily.    Marland Kitchen conjugated estrogens (PREMARIN) vaginal  cream 1/2 gram intravaginal 2 times a week 60 g 1  . diclofenac sodium (VOLTAREN) 1 % GEL Apply 4 g topically 4 (four) times daily. 100 g 4  . ferrous sulfate 325 (65 FE) MG tablet Take 325 mg by mouth daily with breakfast.    . fluconazole (DIFLUCAN) 150 MG tablet     . glipiZIDE (GLUCOTROL XL) 5 MG 24 hr tablet Take 5 mg by mouth daily with breakfast.    . levocetirizine (XYZAL) 5 MG tablet levocetirizine 5 mg tablet    . losartan-hydrochlorothiazide (HYZAAR) 50-12.5 MG per tablet     . metFORMIN (GLUCOPHAGE) 1000 MG tablet Take by mouth.    . mometasone (ASMANEX 30 METERED DOSES) 220 MCG/INH inhaler Inhale into the lungs.    . montelukast (SINGULAIR) 10 MG tablet TAKE 1 TABLET ONE TIME DAILY 90 tablet 3  . naproxen sodium (ANAPROX) 220 MG tablet Take 440 mg by mouth as needed.     . Omega-3 Fatty Acids (FISH OIL CONCENTRATE) 300 MG CAPS Take by mouth.    . pantoprazole (PROTONIX) 40 MG tablet TAKE 1 TABLET ONE TIME DAILY 90 tablet 3  . PULMICORT FLEXHALER 90 MCG/ACT inhaler     . SYNTHROID 125 MCG tablet      No current facility-administered medications on file prior to visit.     Allergies  Allergen Reactions  . Biaxin [Clarithromycin]   . Propulsid [Cisapride] Hives  . Sulfa Antibiotics Hives    Social History   Socioeconomic History  . Marital status: Married    Spouse name: Not on file  . Number of children: Not on file  . Years of education: Not on file  . Highest education level: Not on file  Occupational History  . Not on file  Social Needs  . Financial resource strain: Not on file  . Food insecurity:    Worry: Not on file    Inability: Not on file  . Transportation needs:    Medical: Not on file    Non-medical: Not on file  Tobacco Use  . Smoking status: Never Smoker  . Smokeless tobacco: Never Used  Substance and Sexual Activity  . Alcohol use: No    Alcohol/week: 0.0 oz  . Drug use: No  . Sexual activity: Not Currently  Lifestyle  . Physical activity:    Days per week: Not on file    Minutes per session: Not on file  . Stress: Not on file  Relationships  . Social connections:    Talks on phone: Not on file    Gets together: Not on file    Attends religious service: Not on file    Active member of club or organization: Not on file    Attends meetings of clubs or organizations: Not on file    Relationship status: Not on file  . Intimate partner violence:    Fear of current or ex partner: Not on file    Emotionally abused: Not on file    Physically abused: Not on file    Forced sexual activity: Not on file  Other Topics Concern  . Not on file  Social History Narrative  . Not on file    Family History  Problem Relation Age of Onset  . Osteoporosis Mother   . Breast cancer Maternal Aunt 60  . Diabetes Maternal  Grandmother   . Diabetes Maternal Grandfather   . Heart disease Maternal Grandfather   . Colon cancer Cousin   . Breast cancer  Cousin 90       mat cousin  . Ovarian cancer Neg Hx     The following portions of the patient's history were reviewed and updated as appropriate: allergies, current medications, past family history, past medical history, past social history, past surgical history and problem list.  Review of Systems Review of Systems  Constitutional: Negative.   HENT: Negative.   Eyes: Negative.   Respiratory: Negative.   Cardiovascular: Negative.   Gastrointestinal: Negative.   Genitourinary: Negative.   Musculoskeletal: Negative.   Skin: Negative.   Neurological: Negative.   Endo/Heme/Allergies: Negative.   Psychiatric/Behavioral: Negative.      Objective:  BP (!) 145/71   Pulse 82   Ht 5\' 4"  (1.626 m)   Wt 241 lb (109.3 kg)   BMI 41.37 kg/m  CONSTITUTIONAL: Well-developed, well-nourished obese female in no acute distress.  PSYCHIATRIC: Normal mood and affect. Normal behavior. Normal judgment and thought content. [place]: Alert and oriented to person, place, and time. Normal muscle tone coordination. No cranial nerve deficit noted. HENT:  Normocephalic, atraumatic, External right and left ear normal.  EYES: Conjunctivae and EOM are normal.  No scleral icterus.  NECK: Normal range of motion, supple, no masses.  Normal thyroid.  SKIN: Skin is warm and dry. No rash noted. Not diaphoretic. No erythema. No pallor. CARDIOVASCULAR: Normal heart rate noted, regular rhythm, no murmur, rubs or gallops. RESPIRATORY: Clear to auscultation bilaterally. Effort and breath sounds normal, no problems with respiration noted. BREASTS: No masses, nipple drainage, or lymphadenopathy. ABDOMEN: Soft, normal bowel sounds, no distention noted.  No tenderness, rebound or guarding.  BLADDER: Normal, nontender PELVIC:  External Genitalia: Normal  BUS: Normal  Vagina: minimal vaginal  atrophy, good secretions, no lesions or masses noted, vaginal cuff intact and non-tender.  Cervix: surgically absent  Uterus: surgically absent  Adnexa: Normal  RV: External Exam NormaI, No Rectal Masses and Normal Sphincter tone  MUSCULOSKELETAL: Normal range of motion. No tenderness.  No cyanosis, clubbing, or edema.  2+ distal pulses. LYMPHATIC: No Axillary, Supraclavicular, or Inguinal Adenopathy.    Assessment:   Annual gynecologic examination 67 y.o. Contraception: status post hysterectomy [id number] Problem List Items Addressed This Visit    Increased BMI   Vaginal atrophy   Status post vaginal hysterectomy    Other Visit Diagnoses    Well woman exam    -  Primary   Screening for breast cancer       Screen for colon cancer          Plan:  Pap: Not needed Mammogram: Ordered Stool Guaiac Testing:  ordered Labs: thru pcp Routine preventative health maintenance measures emphasized: Exercise/Diet/Weight control, Tobacco Warnings, Alcohol/Substance use risks and Stress Management Trial of Premarin cream 1/2 g intravaginal twice a week  Joyice Faster, CMA  Brayton Mars, MD   Note: This dictation was prepared with Dragon dictation along with smaller phrase technology. Any transcriptional errors that result from this process are unintentional.

## 2017-07-14 ENCOUNTER — Ambulatory Visit (INDEPENDENT_AMBULATORY_CARE_PROVIDER_SITE_OTHER): Payer: Medicare HMO | Admitting: Obstetrics and Gynecology

## 2017-07-14 ENCOUNTER — Encounter: Payer: Self-pay | Admitting: Obstetrics and Gynecology

## 2017-07-14 VITALS — BP 145/71 | HR 82 | Ht 64.0 in | Wt 241.0 lb

## 2017-07-14 DIAGNOSIS — Z780 Asymptomatic menopausal state: Secondary | ICD-10-CM | POA: Diagnosis not present

## 2017-07-14 DIAGNOSIS — Z01419 Encounter for gynecological examination (general) (routine) without abnormal findings: Secondary | ICD-10-CM

## 2017-07-14 DIAGNOSIS — R638 Other symptoms and signs concerning food and fluid intake: Secondary | ICD-10-CM | POA: Diagnosis not present

## 2017-07-14 DIAGNOSIS — Z1211 Encounter for screening for malignant neoplasm of colon: Secondary | ICD-10-CM

## 2017-07-14 DIAGNOSIS — N952 Postmenopausal atrophic vaginitis: Secondary | ICD-10-CM | POA: Diagnosis not present

## 2017-07-14 DIAGNOSIS — Z1231 Encounter for screening mammogram for malignant neoplasm of breast: Secondary | ICD-10-CM

## 2017-07-14 DIAGNOSIS — Z1239 Encounter for other screening for malignant neoplasm of breast: Secondary | ICD-10-CM

## 2017-07-14 DIAGNOSIS — Z90710 Acquired absence of both cervix and uterus: Secondary | ICD-10-CM

## 2017-07-14 NOTE — Patient Instructions (Addendum)
1.  No Pap smear is done. 2.  Mammogram is ordered. 3.  Stool guaiac card testing is ordered. 4.  Screening labs are to be obtained through primary care 5.  Continue with healthy eating, exercise, and control weight loss 6.  Return in 1 year for annual exam 7.  Trial of Premarin cream 1/2 g intravaginal twice a week   Health Maintenance for Postmenopausal Women Menopause is a normal process in which your reproductive ability comes to an end. This process happens gradually over a span of months to years, usually between the ages of 35 and 100. Menopause is complete when you have missed 12 consecutive menstrual periods. It is important to talk with your health care provider about some of the most common conditions that affect postmenopausal women, such as heart disease, cancer, and bone loss (osteoporosis). Adopting a healthy lifestyle and getting preventive care can help to promote your health and wellness. Those actions can also lower your chances of developing some of these common conditions. What should I know about menopause? During menopause, you may experience a number of symptoms, such as:  Moderate-to-severe hot flashes.  Night sweats.  Decrease in sex drive.  Mood swings.  Headaches.  Tiredness.  Irritability.  Memory problems.  Insomnia.  Choosing to treat or not to treat menopausal changes is an individual decision that you make with your health care provider. What should I know about hormone replacement therapy and supplements? Hormone therapy products are effective for treating symptoms that are associated with menopause, such as hot flashes and night sweats. Hormone replacement carries certain risks, especially as you become older. If you are thinking about using estrogen or estrogen with progestin treatments, discuss the benefits and risks with your health care provider. What should I know about heart disease and stroke? Heart disease, heart attack, and stroke become  more likely as you age. This may be due, in part, to the hormonal changes that your body experiences during menopause. These can affect how your body processes dietary fats, triglycerides, and cholesterol. Heart attack and stroke are both medical emergencies. There are many things that you can do to help prevent heart disease and stroke:  Have your blood pressure checked at least every 1-2 years. High blood pressure causes heart disease and increases the risk of stroke.  If you are 36-2 years old, ask your health care provider if you should take aspirin to prevent a heart attack or a stroke.  Do not use any tobacco products, including cigarettes, chewing tobacco, or electronic cigarettes. If you need help quitting, ask your health care provider.  It is important to eat a healthy diet and maintain a healthy weight. ? Be sure to include plenty of vegetables, fruits, low-fat dairy products, and lean protein. ? Avoid eating foods that are high in solid fats, added sugars, or salt (sodium).  Get regular exercise. This is one of the most important things that you can do for your health. ? Try to exercise for at least 150 minutes each week. The type of exercise that you do should increase your heart rate and make you sweat. This is known as moderate-intensity exercise. ? Try to do strengthening exercises at least twice each week. Do these in addition to the moderate-intensity exercise.  Know your numbers.Ask your health care provider to check your cholesterol and your blood glucose. Continue to have your blood tested as directed by your health care provider.  What should I know about cancer screening? There are  several types of cancer. Take the following steps to reduce your risk and to catch any cancer development as early as possible. Breast Cancer  Practice breast self-awareness. ? This means understanding how your breasts normally appear and feel. ? It also means doing regular breast  self-exams. Let your health care provider know about any changes, no matter how small.  If you are 36 or older, have a clinician do a breast exam (clinical breast exam or CBE) every year. Depending on your age, family history, and medical history, it may be recommended that you also have a yearly breast X-ray (mammogram).  If you have a family history of breast cancer, talk with your health care provider about genetic screening.  If you are at high risk for breast cancer, talk with your health care provider about having an MRI and a mammogram every year.  Breast cancer (BRCA) gene test is recommended for women who have family members with BRCA-related cancers. Results of the assessment will determine the need for genetic counseling and BRCA1 and for BRCA2 testing. BRCA-related cancers include these types: ? Breast. This occurs in males or females. ? Ovarian. ? Tubal. This may also be called fallopian tube cancer. ? Cancer of the abdominal or pelvic lining (peritoneal cancer). ? Prostate. ? Pancreatic.  Cervical, Uterine, and Ovarian Cancer Your health care provider may recommend that you be screened regularly for cancer of the pelvic organs. These include your ovaries, uterus, and vagina. This screening involves a pelvic exam, which includes checking for microscopic changes to the surface of your cervix (Pap test).  For women ages 21-65, health care providers may recommend a pelvic exam and a Pap test every three years. For women ages 12-65, they may recommend the Pap test and pelvic exam, combined with testing for human papilloma virus (HPV), every five years. Some types of HPV increase your risk of cervical cancer. Testing for HPV may also be done on women of any age who have unclear Pap test results.  Other health care providers may not recommend any screening for nonpregnant women who are considered low risk for pelvic cancer and have no symptoms. Ask your health care provider if a screening  pelvic exam is right for you.  If you have had past treatment for cervical cancer or a condition that could lead to cancer, you need Pap tests and screening for cancer for at least 20 years after your treatment. If Pap tests have been discontinued for you, your risk factors (such as having a new sexual partner) need to be reassessed to determine if you should start having screenings again. Some women have medical problems that increase the chance of getting cervical cancer. In these cases, your health care provider may recommend that you have screening and Pap tests more often.  If you have a family history of uterine cancer or ovarian cancer, talk with your health care provider about genetic screening.  If you have vaginal bleeding after reaching menopause, tell your health care provider.  There are currently no reliable tests available to screen for ovarian cancer.  Lung Cancer Lung cancer screening is recommended for adults 75-7 years old who are at high risk for lung cancer because of a history of smoking. A yearly low-dose CT scan of the lungs is recommended if you:  Currently smoke.  Have a history of at least 30 pack-years of smoking and you currently smoke or have quit within the past 15 years. A pack-year is smoking an average of  one pack of cigarettes per day for one year.  Yearly screening should:  Continue until it has been 15 years since you quit.  Stop if you develop a health problem that would prevent you from having lung cancer treatment.  Colorectal Cancer  This type of cancer can be detected and can often be prevented.  Routine colorectal cancer screening usually begins at age 50 and continues through age 96.  If you have risk factors for colon cancer, your health care provider may recommend that you be screened at an earlier age.  If you have a family history of colorectal cancer, talk with your health care provider about genetic screening.  Your health care  provider may also recommend using home test kits to check for hidden blood in your stool.  A small camera at the end of a tube can be used to examine your colon directly (sigmoidoscopy or colonoscopy). This is done to check for the earliest forms of colorectal cancer.  Direct examination of the colon should be repeated every 5-10 years until age 57. However, if early forms of precancerous polyps or small growths are found or if you have a family history or genetic risk for colorectal cancer, you may need to be screened more often.  Skin Cancer  Check your skin from head to toe regularly.  Monitor any moles. Be sure to tell your health care provider: ? About any new moles or changes in moles, especially if there is a change in a mole's shape or color. ? If you have a mole that is larger than the size of a pencil eraser.  If any of your family members has a history of skin cancer, especially at a young age, talk with your health care provider about genetic screening.  Always use sunscreen. Apply sunscreen liberally and repeatedly throughout the day.  Whenever you are outside, protect yourself by wearing long sleeves, pants, a wide-brimmed hat, and sunglasses.  What should I know about osteoporosis? Osteoporosis is a condition in which bone destruction happens more quickly than new bone creation. After menopause, you may be at an increased risk for osteoporosis. To help prevent osteoporosis or the bone fractures that can happen because of osteoporosis, the following is recommended:  If you are 27-20 years old, get at least 1,000 mg of calcium and at least 600 mg of vitamin D per day.  If you are older than age 37 but younger than age 40, get at least 1,200 mg of calcium and at least 600 mg of vitamin D per day.  If you are older than age 12, get at least 1,200 mg of calcium and at least 800 mg of vitamin D per day.  Smoking and excessive alcohol intake increase the risk of osteoporosis. Eat  foods that are rich in calcium and vitamin D, and do weight-bearing exercises several times each week as directed by your health care provider. What should I know about how menopause affects my mental health? Depression may occur at any age, but it is more common as you become older. Common symptoms of depression include:  Low or sad mood.  Changes in sleep patterns.  Changes in appetite or eating patterns.  Feeling an overall lack of motivation or enjoyment of activities that you previously enjoyed.  Frequent crying spells.  Talk with your health care provider if you think that you are experiencing depression. What should I know about immunizations? It is important that you get and maintain your immunizations. These include:  Tetanus, diphtheria, and pertussis (Tdap) booster vaccine.  Influenza every year before the flu season begins.  Pneumonia vaccine.  Shingles vaccine.  Your health care provider may also recommend other immunizations. This information is not intended to replace advice given to you by your health care provider. Make sure you discuss any questions you have with your health care provider. Document Released: 04/10/2005 Document Revised: 09/06/2015 Document Reviewed: 11/20/2014 [employer] Interactive Patient Education  2018 [employer]  

## 2017-07-23 LAB — FECAL OCCULT BLOOD, IMMUNOCHEMICAL: Fecal Occult Bld: NEGATIVE

## 2017-09-14 ENCOUNTER — Ambulatory Visit: Payer: Self-pay | Admitting: Internal Medicine

## 2017-09-27 ENCOUNTER — Ambulatory Visit (INDEPENDENT_AMBULATORY_CARE_PROVIDER_SITE_OTHER): Payer: Medicare HMO

## 2017-09-27 ENCOUNTER — Telehealth: Payer: Self-pay | Admitting: Podiatry

## 2017-09-27 ENCOUNTER — Ambulatory Visit: Payer: Medicare HMO | Admitting: Podiatry

## 2017-09-27 ENCOUNTER — Encounter: Payer: Self-pay | Admitting: Podiatry

## 2017-09-27 DIAGNOSIS — M779 Enthesopathy, unspecified: Secondary | ICD-10-CM

## 2017-09-27 DIAGNOSIS — M778 Other enthesopathies, not elsewhere classified: Secondary | ICD-10-CM

## 2017-09-27 DIAGNOSIS — I872 Venous insufficiency (chronic) (peripheral): Secondary | ICD-10-CM

## 2017-09-27 MED ORDER — DOXYCYCLINE HYCLATE 100 MG PO TABS: 100.0000 mg | ORAL_TABLET | Freq: Two times a day (BID) | ORAL | 0 refills | Status: DC

## 2017-09-27 NOTE — Telephone Encounter (Signed)
Patient wanted to speak with you about some problems shes having with her feet. They are looking red

## 2017-09-27 NOTE — Progress Notes (Signed)
She presents today concerned about swelling in the legs and the redness to the dorsal aspect of the bilateral foot.  She started to get shooting pains across the top of the foot.  Objective: Vital signs are stable alert and oriented x3 she does have redness and swelling of her legs and the dorsal aspect of the bilateral foot pitting in nature.  Radiographs taken today do not demonstrate any acute findings chronic osteoarthritic changes of the midfoot bilaterally.  No fractures are identified.  Assessment: Cellulitis bilateral lower extremity.  Plan: Start her on doxycycline follow-up with her in 2 to 3 weeks.

## 2017-09-27 NOTE — Telephone Encounter (Signed)
I returned patient call, she reported that her feet have been painful, swollen and turning red for 1 week and getting worse.  She has had cellulitis in the past.  She was sent to scheduling and put on Dr. Marisa Cyphers schedule for today.

## 2017-10-07 ENCOUNTER — Telehealth: Payer: Self-pay | Admitting: Podiatry

## 2017-10-07 NOTE — Telephone Encounter (Signed)
Pt wanted to know why she was going to [facility]. Also she wanted to know what her x-ray showed. Could you please give her a call

## 2017-10-08 NOTE — Telephone Encounter (Signed)
Returned patient call, she is schedule with Dr. Delana Meyer on 10/18/17 at 4:15

## 2017-10-18 ENCOUNTER — Ambulatory Visit (INDEPENDENT_AMBULATORY_CARE_PROVIDER_SITE_OTHER): Payer: Medicare HMO | Admitting: Vascular Surgery

## 2017-10-18 ENCOUNTER — Ambulatory Visit: Payer: Medicare HMO | Admitting: Podiatry

## 2017-10-18 ENCOUNTER — Encounter (INDEPENDENT_AMBULATORY_CARE_PROVIDER_SITE_OTHER): Payer: Self-pay | Admitting: Vascular Surgery

## 2017-10-18 VITALS — BP 155/77 | HR 80 | Resp 16 | Ht 64.0 in | Wt 247.0 lb

## 2017-10-18 DIAGNOSIS — M79604 Pain in right leg: Secondary | ICD-10-CM

## 2017-10-18 DIAGNOSIS — M79605 Pain in left leg: Secondary | ICD-10-CM

## 2017-10-18 DIAGNOSIS — G4762 Sleep related leg cramps: Secondary | ICD-10-CM | POA: Diagnosis not present

## 2017-10-18 DIAGNOSIS — I890 Lymphedema, not elsewhere classified: Secondary | ICD-10-CM | POA: Diagnosis not present

## 2017-10-18 DIAGNOSIS — I872 Venous insufficiency (chronic) (peripheral): Secondary | ICD-10-CM | POA: Diagnosis not present

## 2017-10-19 ENCOUNTER — Ambulatory Visit: Payer: Medicare HMO | Admitting: Internal Medicine

## 2017-10-19 ENCOUNTER — Encounter: Payer: Self-pay | Admitting: Adult Health

## 2017-10-19 ENCOUNTER — Encounter (INDEPENDENT_AMBULATORY_CARE_PROVIDER_SITE_OTHER): Payer: Self-pay | Admitting: Vascular Surgery

## 2017-10-19 VITALS — BP 136/72 | HR 85 | Resp 16 | Ht 64.0 in | Wt 249.0 lb

## 2017-10-19 DIAGNOSIS — G4762 Sleep related leg cramps: Secondary | ICD-10-CM | POA: Insufficient documentation

## 2017-10-19 DIAGNOSIS — K219 Gastro-esophageal reflux disease without esophagitis: Secondary | ICD-10-CM | POA: Diagnosis not present

## 2017-10-19 DIAGNOSIS — G4733 Obstructive sleep apnea (adult) (pediatric): Secondary | ICD-10-CM

## 2017-10-19 DIAGNOSIS — J45909 Unspecified asthma, uncomplicated: Secondary | ICD-10-CM

## 2017-10-19 DIAGNOSIS — Z9989 Dependence on other enabling machines and devices: Secondary | ICD-10-CM

## 2017-10-19 DIAGNOSIS — M79606 Pain in leg, unspecified: Secondary | ICD-10-CM | POA: Insufficient documentation

## 2017-10-19 MED ORDER — ALBUTEROL SULFATE HFA 108 (90 BASE) MCG/ACT IN AERS: 2.0000 | INHALATION_SPRAY | Freq: Four times a day (QID) | RESPIRATORY_TRACT | 0 refills | Status: DC | PRN

## 2017-10-19 NOTE — Progress Notes (Signed)
MRN : [id number]  Danielle Delgado is a 67 y.o. (07/15/1950) female who presents with chief complaint of  Chief Complaint  Patient presents with  . Follow-up    Venous Inct.  .  History of Present Illness:   The patient is seen for evaluation of painful lower extremities. The pain occurs primarily at night while the patient is in bed.   The patient describes it as a cramping or Charley horse type pain. The patient notes the pain isn't associated with activity and is not very consistent day to day. The pain seems to be variable with time. Typically the pain occurs with varying positions and seems to progress until the leg is stretched. The pain has been progressive over the past several years which has prompted the concern for evaluation. The patient states this inability to walk has a significant negative impact on her quality of life and daily activities.  The patient denies a history of degenerative spine disease.  The patient denies rest pain. The patient denies dangling off the affected extremity during the night for pain relief. There are no open wounds or sores at this time.  The patient is also c/o painful swelling and varicose veins. The patient relates burning and stinging which worsened steadily throughout the course of the day, particularly with standing. The patient also notes an aching and throbbing pain over the varicosities, particularly with prolonged dependent positions. The symptoms are significantly improved with elevation.  The patient also notes that during hot weather the symptoms are greatly intensified. The patient states the pain from the varicose veins interferes with work, daily exercise, shopping and household maintenance. At this point, the symptoms are persistent and severe enough that they're having a negative impact on lifestyle and are interfering with daily activities.  There is no history of DVT, PE or superficial thrombophlebitis. There is no history of  ulceration or hemorrhage. The patient denies a significant family history of varicose veins.  The patient has not worn graduated compression in the past. At the present time the patient has not been using over-the-counter analgesics. There is no history of prior surgical intervention or sclerotherapy.  No prior vascular interventions or vascular surgeries.  The patient denies amaurosis fugax or recent TIA symptoms. There are no recent neurological changes noted. The patient denies history of DVT, PE or superficial thrombophlebitis. The patient denies recent episodes of angina or shortness of breath.   No outpatient medications have been marked as taking for the 10/18/17 encounter (Office Visit) with Delana Meyer, Dolores Lory, MD.    Past Medical History:  Diagnosis Date  . Asthma   . Chronic vaginitis   . Diabetes mellitus without complication ([place])   . Dyspareunia in female   . Eczema   . FH: breast cancer   . FH: colon cancer   . GERD (gastroesophageal reflux disease)   . Hiatus hernia syndrome   . Hyperlipidemia   . Hypertension   . Hypothyroidism   . Morbid obesity ([place])   . Postmenopausal   . Sleep apnea   . Superficial varicosities   . Vaginal atrophy     Past Surgical History:  Procedure Laterality Date  . BREAST EXCISIONAL BIOPSY Right 1996  . BREAST SURGERY     biopsy  . CHOLECYSTECTOMY    . COLONOSCOPY WITH PROPOFOL N/A 10/12/2014   Procedure: COLONOSCOPY WITH PROPOFOL;  Surgeon: Manya Silvas, MD;  Location: [facility] ENDOSCOPY;  Service: Endoscopy;  Laterality: N/A;  . DILATION AND  CURETTAGE OF UTERUS    . ESOPHAGOGASTRODUODENOSCOPY (EGD) WITH PROPOFOL N/A 10/12/2014   Procedure: ESOPHAGOGASTRODUODENOSCOPY (EGD) WITH PROPOFOL;  Surgeon: Manya Silvas, MD;  Location: [facility] ENDOSCOPY;  Service: Endoscopy;  Laterality: N/A;  . NASAL SINUS SURGERY    . VAGINAL HYSTERECTOMY  1982    Social History Social History   Tobacco Use  . Smoking status: Never Smoker  .  Smokeless tobacco: Never Used  Substance Use Topics  . Alcohol use: No    Alcohol/week: 0.0 standard drinks  . Drug use: No    Family History Family History  Problem Relation Age of Onset  . Osteoporosis Mother   . Breast cancer Maternal Aunt 60  . Diabetes Maternal Grandmother   . Diabetes Maternal Grandfather   . Heart disease Maternal Grandfather   . Colon cancer Cousin   . Breast cancer Cousin 50       mat cousin  . Ovarian cancer Neg Hx     Allergies  Allergen Reactions  . Biaxin [Clarithromycin]   . Propulsid [Cisapride] Hives  . Sulfa Antibiotics Hives     REVIEW OF SYSTEMS (Negative unless checked)  Constitutional: [] Weight loss  [] Fever  [] Chills Cardiac: [] Chest pain   [] Chest pressure   [] Palpitations   [] Shortness of breath when laying flat   [] Shortness of breath with exertion. Vascular:  [] Pain in legs with walking   [x] Pain in legs at rest  [] History of DVT   [] Phlebitis   [x] Swelling in legs   [x] Varicose veins   [] Non-healing ulcers Pulmonary:   [] Uses home oxygen   [] Productive cough   [] Hemoptysis   [] Wheeze  [] COPD   [] Asthma Neurologic:  [] Dizziness   [] Seizures   [] History of stroke   [] History of TIA  [] Aphasia   [] Vissual changes   [] Weakness or numbness in arm   [] Weakness or numbness in leg Musculoskeletal:   [] Joint swelling   [] Joint pain   [] Low back pain Hematologic:  [] Easy bruising  [] Easy bleeding   [] Hypercoagulable state   [] Anemic Gastrointestinal:  [] Diarrhea   [] Vomiting  [] Gastroesophageal reflux/heartburn   [] Difficulty swallowing. Genitourinary:  [] Chronic kidney disease   [] Difficult urination  [] Frequent urination   [] Blood in urine Skin:  [x] Rashes   [] Ulcers  Psychological:  [] History of anxiety   []  History of major depression.  Physical Examination  Vitals:   10/18/17 1616  BP: (!) 155/77  Pulse: 80  Resp: 16  Weight: 247 lb (112 kg)  Height: 5\' 4"  (1.626 m)   Body mass index is 42.4 kg/m. Gen: WD/WN, NAD Head:  [place]/AT, No temporalis wasting.  Ear/Nose/Throat: Hearing grossly intact, nares w/o erythema or drainage Eyes: PER, EOMI, sclera nonicteric.  Neck: Supple, no large masses.   Pulmonary:  Good air movement, no audible wheezing bilaterally, no use of accessory muscles.  Cardiac: RRR, no JVD Vascular: Large varicosities present extensively greater than 8 mm bilaterally.  Moderate venous stasis changes to the legs bilaterally.  2+ soft pitting edema Vessel Right Left  Radial Palpable Palpable  PT Palpable Palpable  DP Palpable Palpable  Gastrointestinal: Non-distended. No guarding/no peritoneal signs.  Musculoskeletal: M/S 5/5 throughout.  No deformity or atrophy.  Neurologic: CN 2-12 intact. Symmetrical.  Speech is fluent. Motor exam as listed above. Psychiatric: Judgment intact, Mood & affect appropriate for pt's clinical situation. Dermatologic: moderate venous rashes no ulcers noted.  No changes consistent with cellulitis. Lymph : No lichenification or skin changes of chronic lymphedema.  CBC Lab Results  Component Value  Date   WBC 11.4 (H) 11/06/2014   HGB 11.8 (L) 11/06/2014   HCT 35.5 11/06/2014   MCV 89.3 11/06/2014   PLT 284 11/06/2014    BMET No results found for: NA, K, CL, CO2, GLUCOSE, BUN, CREATININE, CALCIUM, GFRNONAA, GFRAA CrCl cannot be calculated (No successful lab value found.).  COAG No results found for: INR, PROTIME  Radiology Dg Foot Complete Left  Result Date: 09/27/2017 Please see detailed radiograph report in office note.  Dg Foot Complete Right  Result Date: 09/27/2017 Please see detailed radiograph report in office note.    Assessment/Plan 1. Pain in both lower extremities  Recommend:  The patient has large symptomatic varicose veins that are painful and associated with swelling.  I have had a long discussion with the patient regarding  varicose veins and why they cause symptoms.  Patient will begin wearing graduated compression stockings  class 1 on a daily basis, beginning first thing in the morning and removing them in the evening. The patient is instructed specifically not to sleep in the stockings.    The patient  will also begin using over-the-counter analgesics such as Motrin 600 mg po TID to help control the symptoms.    In addition, behavioral modification including elevation during the day will be initiated.    Pending the results of these changes the  patient will be reevaluated in three months.   An  ultrasound of the venous system will be obtained.   Further plans will be based on the ultrasound results and whether conservative therapies are successful at eliminating the pain and swelling.   - VAS [place] LOWER EXTREMITY VENOUS REFLUX; Future  2. Nocturnal leg cramps Recommend:  The patient is describing Charley horse type leg cramps. No invasive studies, angiography or surgery at this time.    I have reviewed homeopathic remedies such as Cider vinegar or mustard; placing a bar of soap at the bottom of the bed. Quinine is also an option Magnesium supplementation at bedtime was also reviewed.  The patient should continue walking and begin a more formal exercise program.  The patient should continue antiplatelet therapy and aggressive treatment of the lipid abnormalities  The patient should continue wearing graduated compression socks 10-15 mmHg strength to control any mild edema.  The patient will follow up with me on a PRN basis.   3. Lymphedema I have had a long discussion with the patient regarding swelling and why it  causes symptoms.  Patient will begin wearing graduated compression stockings class 1 (20-30 mmHg) on a daily basis a prescription was given. The patient will  beginning wearing the stockings first thing in the morning and removing them in the evening. The patient is instructed specifically not to sleep in the stockings.   In addition, behavioral modification will be initiated.  This will include  frequent elevation, use of over the counter pain medications and exercise such as walking.  I have reviewed systemic causes for chronic edema such as liver, kidney and cardiac etiologies.  The patient denies problems with these organ systems.    Consideration for a lymph pump will also be made based upon the effectiveness of conservative therapy.  This would help to improve the edema control and prevent sequela such as ulcers and infections   Patient should undergo duplex ultrasound of the venous system to ensure that DVT or reflux is not present.  The patient will follow-up with me after the ultrasound.    4. Chronic venous insufficiency No  surgery or intervention at this point in time.    I have had a long discussion with the patient regarding venous insufficiency and why it  causes symptoms. I have discussed with the patient the chronic skin changes that accompany venous insufficiency and the long term sequela such as infection and ulceration.  Patient will begin wearing graduated compression stockings class 1 (20-30 mmHg) or compression wraps on a daily basis a prescription was given. The patient will put the stockings on first thing in the morning and removing them in the evening. The patient is instructed specifically not to sleep in the stockings.    In addition, behavioral modification including several periods of elevation of the lower extremities during the day will be continued. I have demonstrated that proper elevation is a position with the ankles at heart level.  The patient is instructed to begin routine exercise, especially walking on a daily basis  Patient should undergo duplex ultrasound of the venous system to ensure that DVT or reflux is not present.  Following the review of the ultrasound the patient will follow up in 2-3 months to reassess the degree of swelling and the control that graduated compression stockings or compression wraps  is offering.   The patient can be  assessed for a Lymph Pump at that time  - VAS [place] LOWER EXTREMITY VENOUS REFLUX; Future    Hortencia Pilar, MD  10/19/2017 10:00 AM

## 2017-10-19 NOTE — Progress Notes (Signed)
[facility] [place], [place] [address]  Pulmonary Sleep Medicine   Office Visit Note  Patient Name: Danielle Delgado DOB: 22-Sep-1950 MRN [id number]  Date of Service: 10/19/2017  Complaints/HPI: Pt here for pulmonary follow up on her asthma.  She reports using her rescue inhaler a minimal amount, typically on humid days.  She is requesting a refill on her inhaler.  She generally is in good health.  Denies hospitalizations, and has no complaints generally. She also has OSA.  She reports using her auto-pap every night.  She remains concerned by the marks her mask leaves on her face.  Denies resp infections.  Reports minor cough over the last night, no congestion at this time.    ROS  General: (-) fever, (-) chills, (-) night sweats, (-) weakness Skin: (-) rashes, (-) itching,. Eyes: (-) visual changes, (-) redness, (-) itching. Nose and Sinuses: (-) nasal stuffiness or itchiness, (-) postnasal drip, (-) nosebleeds, (-) sinus trouble. Mouth and Throat: (-) sore throat, (-) hoarseness. Neck: (-) swollen glands, (-) enlarged thyroid, (-) neck pain. Respiratory: - cough, (-) bloody sputum, - shortness of breath, - wheezing. Cardiovascular: +ankle swelling, (-) chest pain. Lymphatic: (-) lymph node enlargement. Neurologic: (-) numbness, (-) tingling. Psychiatric: (-) anxiety, (-) depression   Current Medication: Outpatient Encounter Medications as of 10/19/2017  Medication Sig Note  . ACCU-CHEK AVIVA PLUS test strip  01/17/2014: Received from: External Pharmacy  . acetaminophen (TYLENOL) 500 MG tablet Take 1,000 mg by mouth every 6 (six) hours as needed.   Marland Kitchen albuterol (PROAIR HFA) 108 (90 BASE) MCG/ACT inhaler Inhale into the lungs. 11/06/2014: Received from: [facility]  . aspirin 325 MG EC tablet Take 81 mg by mouth daily.    Marland Kitchen atorvastatin (LIPITOR) 10 MG tablet  01/17/2014: Received from: External Pharmacy  . Biotin 5000 MCG TABS Take 1 tablet  by mouth daily.   . calcium carbonate (OS-CAL) 600 MG TABS tablet Take 600 mg by mouth 2 (two) times daily with a meal.   . Calcium Carbonate-Vitamin D (OYSTER SHELL CALCIUM 500 + D) 500-125 MG-UNIT TABS Take by mouth.   . cetirizine (ZYRTEC) 10 MG tablet Take 10 mg by mouth daily.   Marland Kitchen conjugated estrogens (PREMARIN) vaginal cream 1/2 gram intravaginal 2 times a week   . diclofenac sodium (VOLTAREN) 1 % GEL Apply 4 g topically 4 (four) times daily.   Marland Kitchen doxycycline (VIBRA-TABS) 100 MG tablet Take 1 tablet (100 mg total) by mouth 2 (two) times daily.   . ferrous sulfate 325 (65 FE) MG tablet Take 325 mg by mouth daily with breakfast.   . glipiZIDE (GLUCOTROL XL) 5 MG 24 hr tablet Take 5 mg by mouth daily with breakfast.   . losartan-hydrochlorothiazide (HYZAAR) 50-12.5 MG per tablet  01/17/2014: Received from: External Pharmacy  . metFORMIN (GLUCOPHAGE) 1000 MG tablet Take by mouth. 11/06/2014: Received from: [place]  . mometasone (ASMANEX 30 METERED DOSES) 220 MCG/INH inhaler Inhale into the lungs.   . montelukast (SINGULAIR) 10 MG tablet TAKE 1 TABLET ONE TIME DAILY   . Omega-3 Fatty Acids (FISH OIL CONCENTRATE) 300 MG CAPS Take by mouth. 11/06/2014: Received from: [facility]  . pantoprazole (PROTONIX) 40 MG tablet TAKE 1 TABLET ONE TIME DAILY   . SYNTHROID 125 MCG tablet  01/17/2014: Received from: External Pharmacy   No facility-administered encounter medications on file as of 10/19/2017.     Surgical History: Past Surgical History:  Procedure Laterality Date  . BREAST EXCISIONAL BIOPSY Right 1996  . BREAST SURGERY     biopsy  . CHOLECYSTECTOMY    . COLONOSCOPY WITH PROPOFOL N/A 10/12/2014   Procedure: COLONOSCOPY WITH PROPOFOL;  Surgeon: Manya Silvas, MD;  Location: [facility] ENDOSCOPY;  Service: Endoscopy;  Laterality: N/A;  . DILATION AND CURETTAGE OF UTERUS    . ESOPHAGOGASTRODUODENOSCOPY (EGD) WITH PROPOFOL N/A 10/12/2014   Procedure:  ESOPHAGOGASTRODUODENOSCOPY (EGD) WITH PROPOFOL;  Surgeon: Manya Silvas, MD;  Location: [facility] ENDOSCOPY;  Service: Endoscopy;  Laterality: N/A;  . NASAL SINUS SURGERY    . VAGINAL HYSTERECTOMY  1982    Medical History: Past Medical History:  Diagnosis Date  . Asthma   . Chronic vaginitis   . Diabetes mellitus without complication ([place])   . Dyspareunia in female   . Eczema   . FH: breast cancer   . FH: colon cancer   . GERD (gastroesophageal reflux disease)   . Hiatus hernia syndrome   . Hyperlipidemia   . Hypertension   . Hypothyroidism   . Morbid obesity ([place])   . Postmenopausal   . Sleep apnea   . Superficial varicosities   . Vaginal atrophy     Family History: Family History  Problem Relation Age of Onset  . Osteoporosis Mother   . Breast cancer Maternal Aunt 60  . Diabetes Maternal Grandmother   . Diabetes Maternal Grandfather   . Heart disease Maternal Grandfather   . Colon cancer Cousin   . Breast cancer Cousin 24       mat cousin  . Ovarian cancer Neg Hx     Social History: Social History   Socioeconomic History  . Marital status: Married    Spouse name: Not on file  . Number of children: Not on file  . Years of education: Not on file  . Highest education level: Not on file  Occupational History  . Not on file  Social Needs  . Financial resource strain: Not on file  . Food insecurity:    Worry: Not on file    Inability: Not on file  . Transportation needs:    Medical: Not on file    Non-medical: Not on file  Tobacco Use  . Smoking status: Never Smoker  . Smokeless tobacco: Never Used  Substance and Sexual Activity  . Alcohol use: No    Alcohol/week: 0.0 standard drinks  . Drug use: No  . Sexual activity: Not Currently  Lifestyle  . Physical activity:    Days per week: 2 days    Minutes per session: 30 min  . Stress: Not on file  Relationships  . Social connections:    Talks on phone: Not on file    Gets together: Not on file     Attends religious service: Not on file    Active member of club or organization: Not on file    Attends meetings of clubs or organizations: Not on file    Relationship status: Not on file  . Intimate partner violence:    Fear of current or ex partner: Not on file    Emotionally abused: Not on file    Physically abused: Not on file    Forced sexual activity: Not on file  Other Topics Concern  . Not on file  Social History Narrative  . Not on file    Vital Signs: Blood pressure 136/72, pulse 85, resp. rate 16, height 5\' 4"  (1.626 m), weight 249 lb (112.9 kg),  SpO2 95 %.  Examination: General Appearance: The patient is well-developed, well-nourished, and in no distress. Skin: Gross inspection of skin unremarkable. Head: normocephalic, no gross deformities. Eyes: no gross deformities noted. ENT: ears appear grossly normal no exudates. Neck: Supple. No thyromegaly. No LAD. Respiratory: Clear to auscultation bilaterally. Cardiovascular: Normal S1 and S2 without murmur or rub. Extremities: No cyanosis. pulses are equal. Neurologic: Alert and oriented. No involuntary movements.  LABS: No results found for this or any previous visit (from the past 2160 hour(s)).  Radiology: Mm Screening Breast Tomo Bilateral  Result Date: 11/24/2016 CLINICAL DATA:  Screening. EXAM: 2D DIGITAL SCREENING BILATERAL MAMMOGRAM WITH CAD AND ADJUNCT TOMO COMPARISON:  Previous exam(s). ACR Breast Density Category b: There are scattered areas of fibroglandular density. FINDINGS: There are no findings suspicious for malignancy. Images were processed with CAD. IMPRESSION: No mammographic evidence of malignancy. A result letter of this screening mammogram will be mailed directly to the patient. RECOMMENDATION: Screening mammogram in one year. (Code:SM-B-01Y) BI-RADS CATEGORY  1: Negative. Electronically Signed   By: Lajean Manes M.D.   On: 11/24/2016 08:45    No results found.  Dg Foot Complete Left  Result  Date: 09/27/2017 Please see detailed radiograph report in office note.  Dg Foot Complete Right  Result Date: 09/27/2017 Please see detailed radiograph report in office note.     Assessment and Plan: Patient Active Problem List   Diagnosis Date Noted  . Leg pain 10/19/2017  . Nocturnal leg cramps 10/19/2017  . Chronic venous insufficiency 03/02/2017  . Lymphedema 03/02/2017  . Increased BMI 07/10/2015  . Pelvic pain in female 07/10/2015  . Vaginal atrophy 07/10/2015  . Status post vaginal hysterectomy 07/10/2015    1. Uncomplicated asthma, unspecified asthma severity, unspecified whether persistent Continue current management. Refilled rescue inhaler at this time.   - albuterol (PROAIR HFA) 108 (90 Base) MCG/ACT inhaler; Inhale 2 puffs into the lungs every 6 (six) hours as needed for wheezing or shortness of breath.  Dispense: 8 g; Refill: 0  2. OSA on CPAP Continued compliance with CPAP.    3. Gastroesophageal reflux disease without esophagitis Denies issues currently, continue Protonix.   4. Morbid obesity ([place]) Obesity Counseling: Risk Assessment: An assessment of behavioral risk factors was made today and includes lack of exercise sedentary lifestyle, lack of portion control and poor dietary habits.  Risk Modification Advice: She was counseled on portion control guidelines.10 minRestricting daily caloric intake to. . The detrimental long term effects of obesity on her health and ongoing poor compliance was also discussed with the patient.   General Counseling: I have discussed the findings of the evaluation and examination with Mariann Laster.  I have also discussed any further diagnostic evaluation thatmay be needed or ordered today. Rhealyn verbalizes understanding of the findings of todays visit. We also reviewed her medications today and discussed drug interactions and side effects including but not limited excessive drowsiness and altered mental states. We also discussed that  there is always a risk not just to her but also people around her. she has been encouraged to call the office with any questions or concerns that should arise related to todays visit.    Time spent: 30 This patient was seen by Orson Gear AGNP-C in Collaboration with Dr. Devona Konig as a part of collaborative care agreement.  I have personally obtained a history, examined the patient, evaluated laboratory and imaging results, formulated the assessment and plan and placed orders.    Allyne Gee, MD  [facility] Pulmonary and Critical Care Sleep medicine

## 2017-10-19 NOTE — Patient Instructions (Signed)

## 2017-10-26 ENCOUNTER — Ambulatory Visit: Payer: Self-pay | Admitting: Internal Medicine

## 2017-11-24 ENCOUNTER — Ambulatory Visit
Admission: RE | Admit: 2017-11-24 | Discharge: 2017-11-24 | Disposition: A | Discharge status: Home / Self Care | Payer: Medicare HMO | Source: Ambulatory Visit | Attending: Obstetrics and Gynecology | Admitting: Obstetrics and Gynecology

## 2017-11-24 DIAGNOSIS — Z1239 Encounter for other screening for malignant neoplasm of breast: Secondary | ICD-10-CM

## 2017-11-24 DIAGNOSIS — Z1231 Encounter for screening mammogram for malignant neoplasm of breast: Secondary | ICD-10-CM | POA: Insufficient documentation

## 2018-01-17 ENCOUNTER — Ambulatory Visit: Payer: Medicare HMO | Admitting: Podiatry

## 2018-02-07 ENCOUNTER — Ambulatory Visit (INDEPENDENT_AMBULATORY_CARE_PROVIDER_SITE_OTHER): Payer: Medicare HMO | Admitting: Vascular Surgery

## 2018-02-07 ENCOUNTER — Encounter (INDEPENDENT_AMBULATORY_CARE_PROVIDER_SITE_OTHER): Payer: Self-pay | Admitting: Vascular Surgery

## 2018-02-07 ENCOUNTER — Ambulatory Visit (INDEPENDENT_AMBULATORY_CARE_PROVIDER_SITE_OTHER): Payer: Medicare HMO

## 2018-02-07 VITALS — BP 157/81 | HR 76 | Resp 17 | Ht 63.0 in | Wt 241.0 lb

## 2018-02-07 DIAGNOSIS — I890 Lymphedema, not elsewhere classified: Secondary | ICD-10-CM | POA: Diagnosis not present

## 2018-02-07 DIAGNOSIS — I872 Venous insufficiency (chronic) (peripheral): Secondary | ICD-10-CM | POA: Diagnosis not present

## 2018-02-07 DIAGNOSIS — M79605 Pain in left leg: Secondary | ICD-10-CM

## 2018-02-07 DIAGNOSIS — M79604 Pain in right leg: Secondary | ICD-10-CM

## 2018-02-07 DIAGNOSIS — G4762 Sleep related leg cramps: Secondary | ICD-10-CM | POA: Diagnosis not present

## 2018-02-07 NOTE — Progress Notes (Signed)
MRN : [id number]  Danielle Delgado is a 67 y.o. (01/29/1951) female who presents with chief complaint of  Chief Complaint  Patient presents with  . Follow-up    3 month Venous reflux u/s  .  History of Present Illness:   The patient returns for followup evaluation 3 months after the initial visit. The patient continues to have pain in the lower extremities with dependency. The pain is lessened with elevation. Graduated compression stockings, Class I (20-30 mmHg), have been worn but the stockings do not eliminate the leg pain. Over-the-counter analgesics do not improve the symptoms. The degree of discomfort continues to interfere with daily activities. The patient notes the pain in the legs is causing problems with daily exercise, at the workplace and even with household activities and maintenance such as standing in the kitchen preparing meals and doing dishes.   Venous ultrasound does not show deep venous reflux, no evidence of acute or chronic DVT.  Superficial reflux is present in the proximal GSV on the right  None on the left   Current Meds  Medication Sig  . acetaminophen (TYLENOL) 500 MG tablet Take 1,000 mg by mouth every 6 (six) hours as needed.  Marland Kitchen albuterol (PROAIR HFA) 108 (90 Base) MCG/ACT inhaler Inhale 2 puffs into the lungs every 6 (six) hours as needed for wheezing or shortness of breath.  Marland Kitchen aspirin 325 MG EC tablet Take 81 mg by mouth daily.   Marland Kitchen atorvastatin (LIPITOR) 10 MG tablet   . Biotin 5000 MCG TABS Take 1 tablet by mouth daily.  . calcium carbonate (OS-CAL) 600 MG TABS tablet Take 600 mg by mouth 2 (two) times daily with a meal.  . cetirizine (ZYRTEC) 10 MG tablet Take 10 mg by mouth daily.  . diclofenac sodium (VOLTAREN) 1 % GEL Apply 4 g topically 4 (four) times daily.  . ferrous sulfate 325 (65 FE) MG tablet Take 325 mg by mouth daily with breakfast.  . glipiZIDE (GLUCOTROL XL) 5 MG 24 hr tablet Take 5 mg by mouth daily with breakfast.  .  losartan-hydrochlorothiazide (HYZAAR) 50-12.5 MG per tablet   . magnesium oxide (MAG-OX) 400 MG tablet Take 400 mg by mouth daily.  . metFORMIN (GLUCOPHAGE) 1000 MG tablet Take by mouth.  . mometasone (ASMANEX 30 METERED DOSES) 220 MCG/INH inhaler Inhale into the lungs.  . montelukast (SINGULAIR) 10 MG tablet TAKE 1 TABLET ONE TIME DAILY  . Omega-3 Fatty Acids (FISH OIL CONCENTRATE) 300 MG CAPS Take by mouth.   . pantoprazole (PROTONIX) 40 MG tablet TAKE 1 TABLET ONE TIME DAILY  . SYNTHROID 125 MCG tablet     Past Medical History:  Diagnosis Date  . Asthma   . Chronic vaginitis   . Diabetes mellitus without complication ([place])   . Dyspareunia in female   . Eczema   . FH: breast cancer   . FH: colon cancer   . GERD (gastroesophageal reflux disease)   . Hiatus hernia syndrome   . Hyperlipidemia   . Hypertension   . Hypothyroidism   . Morbid obesity ([place])   . Postmenopausal   . Sleep apnea   . Superficial varicosities   . Vaginal atrophy     Past Surgical History:  Procedure Laterality Date  . BREAST EXCISIONAL BIOPSY Right 1996   neg  . BREAST SURGERY     biopsy  . CHOLECYSTECTOMY    . COLONOSCOPY WITH PROPOFOL N/A 10/12/2014   Procedure: COLONOSCOPY WITH PROPOFOL;  Surgeon: Gavin Pound  Vira Agar, MD;  Location: [facility];  Service: Endoscopy;  Laterality: N/A;  . DILATION AND CURETTAGE OF UTERUS    . ESOPHAGOGASTRODUODENOSCOPY (EGD) WITH PROPOFOL N/A 10/12/2014   Procedure: ESOPHAGOGASTRODUODENOSCOPY (EGD) WITH PROPOFOL;  Surgeon: Manya Silvas, MD;  Location: [facility];  Service: Endoscopy;  Laterality: N/A;  . NASAL SINUS SURGERY    . VAGINAL HYSTERECTOMY  1982    Social History Social History   Tobacco Use  . Smoking status: Never Smoker  . Smokeless tobacco: Never Used  Substance Use Topics  . Alcohol use: No    Alcohol/week: 0.0 standard drinks  . Drug use: No    Family History Family History  Problem Relation Age of Onset  . Osteoporosis Mother    . Breast cancer Maternal Aunt 60  . Diabetes Maternal Grandmother   . Diabetes Maternal Grandfather   . Heart disease Maternal Grandfather   . Colon cancer Cousin   . Breast cancer Cousin 40       mat cousin  . Ovarian cancer Neg Hx     Allergies  Allergen Reactions  . Biaxin [Clarithromycin]   . Propulsid [Cisapride] Hives  . Sulfa Antibiotics Hives     REVIEW OF SYSTEMS (Negative unless checked)  Constitutional: [] Weight loss  [] Fever  [] Chills Cardiac: [] Chest pain   [] Chest pressure   [] Palpitations   [] Shortness of breath when laying flat   [] Shortness of breath with exertion. Vascular:  [] Pain in legs with walking   [x] Pain in legs at rest  [] History of DVT   [] Phlebitis   [x] Swelling in legs   [] Varicose veins   [] Non-healing ulcers Pulmonary:   [] Uses home oxygen   [] Productive cough   [] Hemoptysis   [] Wheeze  [] COPD   [] Asthma Neurologic:  [] Dizziness   [] Seizures   [] History of stroke   [] History of TIA  [] Aphasia   [] Vissual changes   [] Weakness or numbness in arm   [] Weakness or numbness in leg Musculoskeletal:   [] Joint swelling   [] Joint pain   [] Low back pain Hematologic:  [] Easy bruising  [] Easy bleeding   [] Hypercoagulable state   [] Anemic Gastrointestinal:  [] Diarrhea   [] Vomiting  [] Gastroesophageal reflux/heartburn   [] Difficulty swallowing. Genitourinary:  [] Chronic kidney disease   [] Difficult urination  [] Frequent urination   [] Blood in urine Skin:  [] Rashes   [] Ulcers  Psychological:  [] History of anxiety   []  History of major depression.  Physical Examination  Vitals:   02/07/18 1613  BP: (!) 157/81  Pulse: 76  Resp: 17  Weight: 241 lb (109.3 kg)  Height: 5\' 3"  (1.6 m)   Body mass index is 42.69 kg/m. Gen: WD/WN, NAD Head: [place]/AT, No temporalis wasting.  Ear/Nose/Throat: Hearing grossly intact, nares w/o erythema or drainage Eyes: PER, EOMI, sclera nonicteric.  Neck: Supple, no large masses.   Pulmonary:  Good air movement, no audible  wheezing bilaterally, no use of accessory muscles.  Cardiac: RRR, no JVD Vascular: scattered varicosities present bilaterally.  Mild venous stasis changes to the legs bilaterally.  2+ soft pitting edema Vessel Right Left  Radial Palpable Palpable  PT Palpable Palpable  DP Palpable Palpable  Gastrointestinal: Non-distended. No guarding/no peritoneal signs.  Musculoskeletal: M/S 5/5 throughout.  No deformity or atrophy.  Neurologic: CN 2-12 intact. Symmetrical.  Speech is fluent. Motor exam as listed above. Psychiatric: Judgment intact, Mood & affect appropriate for pt's clinical situation. Dermatologic: No rashes or ulcers noted.  No changes consistent with cellulitis. Lymph : No lichenification or skin changes  of chronic lymphedema.  CBC Lab Results  Component Value Date   WBC 11.4 (H) 11/06/2014   HGB 11.8 (L) 11/06/2014   HCT 35.5 11/06/2014   MCV 89.3 11/06/2014   PLT 284 11/06/2014    BMET No results found for: NA, K, CL, CO2, GLUCOSE, BUN, CREATININE, CALCIUM, GFRNONAA, GFRAA CrCl cannot be calculated (No successful lab value found.).  COAG No results found for: INR, PROTIME  Radiology No results found.   Assessment/Plan 1. Chronic venous insufficiency No surgery or intervention at this point in time.  I have reviewed my discussion with the patient regarding venous insufficiency and why it causes symptoms. I have discussed with the patient the chronic skin changes that accompany venous insufficiency and the long term sequela such as ulceration. Patient will contnue wearing graduated compression stockings on a daily basis, as this has provided excellent control of his edema. The patient will put the stockings on first thing in the morning and removing them in the evening. The patient is reminded not to sleep in the stockings.  In addition, behavioral modification including elevation during the day will be initiated. Exercise is strongly encouraged.  Given the patient's  good control and lack of any problems regarding the venous insufficiency and lymphedema a lymph pump in not need at this time.  The patient will follow up with me PRN should anything change.  The patient voices agreement with this plan.   2. Lymphedema No surgery or intervention at this point in time.  I have reviewed my discussion with the patient regarding venous insufficiency and why it causes symptoms. I have discussed with the patient the chronic skin changes that accompany venous insufficiency and the long term sequela such as ulceration. Patient will contnue wearing graduated compression stockings on a daily basis, as this has provided excellent control of his edema. The patient will put the stockings on first thing in the morning and removing them in the evening. The patient is reminded not to sleep in the stockings.  In addition, behavioral modification including elevation during the day will be initiated. Exercise is strongly encouraged.  Given the patient's good control and lack of any problems regarding the venous insufficiency and lymphedema a lymph pump in not need at this time.  The patient will follow up with me PRN should anything change.  The patient voices agreement with this plan.   3. Pain in both lower extremities See number One  4. Nocturnal leg cramps Recommend:  The patient is describing Charley horse type leg cramps. No invasive studies, angiography or surgery at this time.    I have reviewed homeopathic remedies such as Cider vinegar or mustard; placing a bar of soap at the bottom of the bed. Quinine is also an option Magnesium supplementation at bedtime was also reviewed.  The patient should continue walking and begin a more formal exercise program.  The patient should continue antiplatelet therapy and aggressive treatment of the lipid abnormalities  The patient should continue wearing graduated compression socks 20-30 mmHg strength to control any mild  edema.  The patient will follow up with me on a PRN basis.   Hortencia Pilar, MD  02/07/2018 4:39 PM

## 2018-02-14 ENCOUNTER — Encounter: Payer: Self-pay | Admitting: Adult Health

## 2018-02-14 ENCOUNTER — Ambulatory Visit: Payer: Medicare HMO | Admitting: Adult Health

## 2018-02-14 VITALS — BP 146/74 | HR 79 | Temp 97.2°F | Resp 16 | Ht 64.0 in | Wt 239.0 lb

## 2018-02-14 DIAGNOSIS — J029 Acute pharyngitis, unspecified: Secondary | ICD-10-CM

## 2018-02-14 DIAGNOSIS — J0110 Acute frontal sinusitis, unspecified: Secondary | ICD-10-CM | POA: Diagnosis not present

## 2018-02-14 DIAGNOSIS — R05 Cough: Secondary | ICD-10-CM

## 2018-02-14 DIAGNOSIS — R059 Cough, unspecified: Secondary | ICD-10-CM

## 2018-02-14 DIAGNOSIS — R6889 Other general symptoms and signs: Secondary | ICD-10-CM | POA: Diagnosis not present

## 2018-02-14 LAB — POCT RAPID STREP A (OFFICE): Rapid Strep A Screen: NEGATIVE

## 2018-02-14 LAB — POCT INFLUENZA A/B
INFLUENZA A, POC: NEGATIVE
Influenza B, POC: NEGATIVE

## 2018-02-14 MED ORDER — AMOXICILLIN-POT CLAVULANATE 875-125 MG PO TABS: 1.0000 | ORAL_TABLET | Freq: Two times a day (BID) | ORAL | 0 refills | Status: DC

## 2018-02-14 NOTE — Patient Instructions (Signed)

## 2018-02-14 NOTE — Progress Notes (Signed)
[facility] [place], [place] [address]  Pulmonary Sleep Medicine   Office Visit Note  Patient Name: Danielle Delgado DOB: 1950/12/17 MRN [id number]  Date of Service: 02/14/2018  Complaints/HPI: Pt is here for sick visit.  She reports sore throat, chest congestion x 3 days.  She reports it progressed to cough and back pain.  She is concerned that her sickness will turn into pneumonia.  Patient reports her throat is sore first thing in the morning and gets better throughout the day.  She is slightly concerned that she may have the flu and will be tested at this visit.  ROS  General: (-) fever, (-) chills, (-) night sweats, (-) weakness Skin: (-) rashes, (-) itching,. Eyes: (-) visual changes, (-) redness, (-) itching. Nose and Sinuses: (-) nasal stuffiness or itchiness, (-) postnasal drip, (-) nosebleeds, (-) sinus trouble. Mouth and Throat: (-) sore throat, (-) hoarseness. Neck: (-) swollen glands, (-) enlarged thyroid, (-) neck pain. Respiratory: + cough, (-) bloody sputum, - shortness of breath, - wheezing. Cardiovascular: - ankle swelling, (-) chest pain. Lymphatic: (-) lymph node enlargement. Neurologic: (-) numbness, (-) tingling. Psychiatric: (-) anxiety, (-) depression   Current Medication: Outpatient Encounter Medications as of 02/14/2018  Medication Sig Note  . ACCU-CHEK AVIVA PLUS test strip  01/17/2014: Received from: External Pharmacy  . acetaminophen (TYLENOL) 500 MG tablet Take 1,000 mg by mouth every 6 (six) hours as needed.   Marland Kitchen albuterol (PROAIR HFA) 108 (90 Base) MCG/ACT inhaler Inhale 2 puffs into the lungs every 6 (six) hours as needed for wheezing or shortness of breath.   Marland Kitchen aspirin 325 MG EC tablet Take 81 mg by mouth daily.    Marland Kitchen atorvastatin (LIPITOR) 10 MG tablet  01/17/2014: Received from: External Pharmacy  . Biotin 5000 MCG TABS Take 1 tablet by mouth daily.   . calcium carbonate (OS-CAL) 600 MG TABS tablet Take 600 mg by mouth 2  (two) times daily with a meal.   . cetirizine (ZYRTEC) 10 MG tablet Take 10 mg by mouth daily.   Marland Kitchen conjugated estrogens (PREMARIN) vaginal cream 1/2 gram intravaginal 2 times a week   . diclofenac sodium (VOLTAREN) 1 % GEL Apply 4 g topically 4 (four) times daily.   . ferrous sulfate 325 (65 FE) MG tablet Take 325 mg by mouth daily with breakfast.   . glipiZIDE (GLUCOTROL XL) 5 MG 24 hr tablet Take 5 mg by mouth daily with breakfast.   . losartan-hydrochlorothiazide (HYZAAR) 50-12.5 MG per tablet  01/17/2014: Received from: External Pharmacy  . magnesium oxide (MAG-OX) 400 MG tablet Take 400 mg by mouth daily.   . metFORMIN (GLUCOPHAGE) 1000 MG tablet Take by mouth. 11/06/2014: Received from: [facility]  . mometasone (ASMANEX 30 METERED DOSES) 220 MCG/INH inhaler Inhale into the lungs.   . montelukast (SINGULAIR) 10 MG tablet TAKE 1 TABLET ONE TIME DAILY   . Omega-3 Fatty Acids (FISH OIL CONCENTRATE) 300 MG CAPS Take by mouth.  11/06/2014: Received from: [facility]  . pantoprazole (PROTONIX) 40 MG tablet TAKE 1 TABLET ONE TIME DAILY   . SYNTHROID 125 MCG tablet  01/17/2014: Received from: External Pharmacy  . amoxicillin-clavulanate (AUGMENTIN) 875-125 MG tablet Take 1 tablet by mouth 2 (two) times daily.    No facility-administered encounter medications on file as of 02/14/2018.     Surgical History: Past Surgical History:  Procedure Laterality Date  . BREAST EXCISIONAL BIOPSY Right 1996   neg  .  BREAST SURGERY     biopsy  . CHOLECYSTECTOMY    . COLONOSCOPY WITH PROPOFOL N/A 10/12/2014   Procedure: COLONOSCOPY WITH PROPOFOL;  Surgeon: Manya Silvas, MD;  Location: [facility] ENDOSCOPY;  Service: Endoscopy;  Laterality: N/A;  . DILATION AND CURETTAGE OF UTERUS    . ESOPHAGOGASTRODUODENOSCOPY (EGD) WITH PROPOFOL N/A 10/12/2014   Procedure: ESOPHAGOGASTRODUODENOSCOPY (EGD) WITH PROPOFOL;  Surgeon: Manya Silvas, MD;  Location: [facility] ENDOSCOPY;  Service:  Endoscopy;  Laterality: N/A;  . NASAL SINUS SURGERY    . VAGINAL HYSTERECTOMY  1982    Medical History: Past Medical History:  Diagnosis Date  . Asthma   . Chronic vaginitis   . Diabetes mellitus without complication ([place])   . Dyspareunia in female   . Eczema   . FH: breast cancer   . FH: colon cancer   . GERD (gastroesophageal reflux disease)   . Hiatus hernia syndrome   . Hyperlipidemia   . Hypertension   . Hypothyroidism   . Morbid obesity ([place])   . Postmenopausal   . Sleep apnea   . Superficial varicosities   . Vaginal atrophy     Family History: Family History  Problem Relation Age of Onset  . Osteoporosis Mother   . Breast cancer Maternal Aunt 60  . Diabetes Maternal Grandmother   . Diabetes Maternal Grandfather   . Heart disease Maternal Grandfather   . Colon cancer Cousin   . Breast cancer Cousin 82       mat cousin  . Ovarian cancer Neg Hx     Social History: Social History   Socioeconomic History  . Marital status: Married    Spouse name: Not on file  . Number of children: Not on file  . Years of education: Not on file  . Highest education level: Not on file  Occupational History  . Not on file  Social Needs  . Financial resource strain: Not on file  . Food insecurity:    Worry: Not on file    Inability: Not on file  . Transportation needs:    Medical: Not on file    Non-medical: Not on file  Tobacco Use  . Smoking status: Never Smoker  . Smokeless tobacco: Never Used  Substance and Sexual Activity  . Alcohol use: No    Alcohol/week: 0.0 standard drinks  . Drug use: No  . Sexual activity: Not Currently  Lifestyle  . Physical activity:    Days per week: 2 days    Minutes per session: 30 min  . Stress: Not on file  Relationships  . Social connections:    Talks on phone: Not on file    Gets together: Not on file    Attends religious service: Not on file    Active member of club or organization: Not on file    Attends meetings of  clubs or organizations: Not on file    Relationship status: Not on file  . Intimate partner violence:    Fear of current or ex partner: Not on file    Emotionally abused: Not on file    Physically abused: Not on file    Forced sexual activity: Not on file  Other Topics Concern  . Not on file  Social History Narrative  . Not on file    Vital Signs: Blood pressure (!) 146/74, pulse 79, temperature (!) 97.2 F (36.2 C), resp. rate 16, height 5\' 4"  (1.626 m), weight 239 lb (108.4 kg), SpO2 99 %.  Examination:  General Appearance: The patient is well-developed, well-nourished, and in no distress. Skin: Gross inspection of skin unremarkable. Head: normocephalic, no gross deformities. Eyes: no gross deformities noted. ENT: ears appear grossly normal no exudates. Neck: Supple. No thyromegaly. No LAD. Respiratory: clear bilaterally. Cardiovascular: Normal S1 and S2 without murmur or rub. Extremities: No cyanosis. pulses are equal. Neurologic: Alert and oriented. No involuntary movements.  LABS: No results found for this or any previous visit (from the past 2160 hour(s)).  Radiology: Mm 3d Screen Breast Bilateral  Result Date: 11/25/2017 CLINICAL DATA:  Screening. EXAM: DIGITAL SCREENING BILATERAL MAMMOGRAM WITH TOMO AND CAD COMPARISON:  Previous exam(s). ACR Breast Density Category b: There are scattered areas of fibroglandular density. FINDINGS: There are no findings suspicious for malignancy. Images were processed with CAD. IMPRESSION: No mammographic evidence of malignancy. A result letter of this screening mammogram will be mailed directly to the patient. RECOMMENDATION: Screening mammogram in one year. (Code:SM-B-01Y) BI-RADS CATEGORY  1: Negative. Electronically Signed   By: Dorise Bullion III M.D   On: 11/25/2017 08:39    No results found.  Vas [place] Lower Extremity Venous Reflux  Result Date: 02/07/2018  Lower Venous Reflux Study Comparison Study: 03/01/2017 Performing  Technologist: Charlane Ferretti RT (R)(VS)  Examination Guidelines: A complete evaluation includes B-mode imaging, spectral Doppler, color Doppler, and power Doppler as needed of all accessible portions of each vessel. Bilateral testing is considered an integral part of a complete examination. Limited examinations for reoccurring indications may be performed as noted. The reflux portion of the exam is performed with the patient in reverse Trendelenburg.  Right Venous Findings: +---------+---------------+---------+-----------+----------+-------+          CompressibilityPhasicitySpontaneityPropertiesSummary +---------+---------------+---------+-----------+----------+-------+ CFV      Full                                                 +---------+---------------+---------+-----------+----------+-------+ SFJ      Full                                                 +---------+---------------+---------+-----------+----------+-------+ FV Prox  Full                                                 +---------+---------------+---------+-----------+----------+-------+ FV Mid   Full                                                 +---------+---------------+---------+-----------+----------+-------+ FV DistalFull                                                 +---------+---------------+---------+-----------+----------+-------+ PFV      Full                                                 +---------+---------------+---------+-----------+----------+-------+  POP      Full                                                 +---------+---------------+---------+-----------+----------+-------+ GSV      Full                                                 +---------+---------------+---------+-----------+----------+-------+ SSV      Full                                                 +---------+---------------+---------+-----------+----------+-------+  Left Venous  Findings: +---------+---------------+---------+-----------+----------+-------+          CompressibilityPhasicitySpontaneityPropertiesSummary +---------+---------------+---------+-----------+----------+-------+ CFV      Full                                                 +---------+---------------+---------+-----------+----------+-------+ SFJ      Full                                                 +---------+---------------+---------+-----------+----------+-------+ FV Prox  Full                                                 +---------+---------------+---------+-----------+----------+-------+ FV Mid   Full                                                 +---------+---------------+---------+-----------+----------+-------+ FV DistalFull                                                 +---------+---------------+---------+-----------+----------+-------+ PFV      Full                                                 +---------+---------------+---------+-----------+----------+-------+ POP      Full                                                 +---------+---------------+---------+-----------+----------+-------+ GSV      Full                                                 +---------+---------------+---------+-----------+----------+-------+  SSV      Full                                                 +---------+---------------+---------+-----------+----------+-------+  Vein Diameters: +------------------------------+----------+---------+                               Right (cm)Left (cm) +------------------------------+----------+---------+ GSV at Saphenofemoral junction.50                 +------------------------------+----------+---------+ GSV at prox thigh             .48                 +------------------------------+----------+---------+ GSV at mid thigh              .38                  +------------------------------+----------+---------+ GSV at distal thigh           .28                 +------------------------------+----------+---------+ GSV at knee                   .20                 +------------------------------+----------+---------+ GSV prox calf                 .22                 +------------------------------+----------+---------+  Right Reflux Technical Findings: Reflux greater than 570ms in the CFV, SFJ and proximal GSV. Left Reflux Technical Findings: Reflux greater than 54ms in the CFV.  Summary: Right: There is no evidence of deep vein thrombosis in the lower extremity.There is no evidence of superficial venous thrombosis. Left: There is no evidence of deep vein thrombosis in the lower extremity.There is no evidence of superficial venous thrombosis.  *See table(s) above for measurements and observations. Electronically signed by Hortencia Pilar MD on 02/07/2018 at 4:58:20 PM.    Final       Assessment and Plan: Patient Active Problem List   Diagnosis Date Noted  . Leg pain 10/19/2017  . Nocturnal leg cramps 10/19/2017  . Chronic venous insufficiency 03/02/2017  . Lymphedema 03/02/2017  . Increased BMI 07/10/2015  . Pelvic pain in female 07/10/2015  . Vaginal atrophy 07/10/2015  . Status post vaginal hysterectomy 07/10/2015   1. Acute non-recurrent frontal sinusitis Patient treated with course of Augmentin for 7 days.  She is instructed to take all medication to complete course.  Also started patient to return to clinic if symptoms do not improve in 7 to 10 days. - amoxicillin-clavulanate (AUGMENTIN) 875-125 MG tablet; Take 1 tablet by mouth 2 (two) times daily.  Dispense: 14 tablet; Refill: 0  2. Cough Cough is likely due to postnasal drip.  Continue to manage symptoms with OTC medications.  3. Sore throat Rapid strep negative.  Patient started [facility] due to postnasal drip.  I encouraged her to continue taking her other medications to combat  symptoms of antibiotics can take effect. - POCT rapid strep A  4. Flu-like symptoms Flu is negative for AMD. - POCT Influenza A/B   General Counseling: I have discussed the findings of the evaluation and examination with Danielle Delgado.  I have also discussed any further diagnostic evaluation thatmay be needed or ordered today. Danielle Delgado verbalizes understanding of the findings of todays visit. We also reviewed her medications today and discussed drug interactions and side effects including but not limited excessive drowsiness and altered mental states. We also discussed that there is always a risk not just to her but also people around her. she has been encouraged to call the office with any questions or concerns that should arise related to todays visit.    Time spent: 25 This patient was seen by Orson Gear AGNP-C in Collaboration with Dr. Devona Konig as a part of collaborative care agreement.   I have personally obtained a history, examined the patient, evaluated laboratory and imaging results, formulated the assessment and plan and placed orders.    Allyne Gee, MD [facility] Pulmonary and Critical Care Sleep medicine

## 2018-02-16 ENCOUNTER — Ambulatory Visit: Payer: Medicare HMO | Admitting: Podiatry

## 2018-02-16 ENCOUNTER — Encounter: Payer: Self-pay | Admitting: Podiatry

## 2018-02-16 DIAGNOSIS — E119 Type 2 diabetes mellitus without complications: Secondary | ICD-10-CM | POA: Diagnosis not present

## 2018-02-16 DIAGNOSIS — M2040 Other hammer toe(s) (acquired), unspecified foot: Secondary | ICD-10-CM

## 2018-02-16 DIAGNOSIS — I872 Venous insufficiency (chronic) (peripheral): Secondary | ICD-10-CM

## 2018-02-16 MED ORDER — DICLOFENAC SODIUM 1 % TD GEL: 4.0000 g | Freq: Four times a day (QID) | TRANSDERMAL | 2 refills | Status: DC

## 2018-02-17 NOTE — Progress Notes (Signed)
She presents today for follow-up of her diabetic foot.  She states that she is doing pretty well other than some pain across the top of the feet.  Objective: Vital signs are stable she is alert oriented x3 pulses are palpable.  She does have venous insufficiency noted by pitting edema.  Multiple varicosities are noted.  Hammertoe deformities are noted.  No open lesions or wounds.  Assessment: Osteoarthritis some early diabetic peripheral neuropathy.  Plan: Dispensed a prescription for diclofenac gel.

## 2018-02-18 ENCOUNTER — Telehealth: Payer: Self-pay

## 2018-02-18 ENCOUNTER — Ambulatory Visit
Admission: RE | Admit: 2018-02-18 | Discharge: 2018-02-18 | Disposition: A | Discharge status: Home / Self Care | Payer: Medicare HMO | Attending: Adult Health | Admitting: Adult Health

## 2018-02-18 ENCOUNTER — Ambulatory Visit
Admission: RE | Admit: 2018-02-18 | Discharge: 2018-02-18 | Disposition: A | Discharge status: Home / Self Care | Payer: Medicare HMO | Source: Ambulatory Visit | Attending: Adult Health | Admitting: Adult Health

## 2018-02-18 ENCOUNTER — Other Ambulatory Visit: Payer: Self-pay | Admitting: Adult Health

## 2018-02-18 DIAGNOSIS — R062 Wheezing: Secondary | ICD-10-CM

## 2018-02-18 DIAGNOSIS — R059 Cough, unspecified: Secondary | ICD-10-CM

## 2018-02-18 DIAGNOSIS — R05 Cough: Secondary | ICD-10-CM | POA: Insufficient documentation

## 2018-02-18 DIAGNOSIS — J0110 Acute frontal sinusitis, unspecified: Secondary | ICD-10-CM

## 2018-02-18 MED ORDER — FLUCONAZOLE 150 MG PO TABS: 150.0000 mg | ORAL_TABLET | ORAL | 1 refills | Status: DC | PRN

## 2018-02-18 MED ORDER — AMOXICILLIN-POT CLAVULANATE 875-125 MG PO TABS: 1.0000 | ORAL_TABLET | Freq: Two times a day (BID) | ORAL | 0 refills | Status: DC

## 2018-02-18 MED ORDER — PREDNISONE 10 MG PO TABS: ORAL_TABLET | ORAL | 0 refills | Status: DC

## 2018-02-18 NOTE — Telephone Encounter (Signed)
SPOKE WITH Danielle Delgado AND HE REVIEWED PT X-RAY.  HE SENT 4-5 MORE DAYS OF AMOXICILLIN, PREDNISONE, AND DIFLUCAN FOR YEAST INFECTION.   PT WAS NOTIFIED OF ALL.

## 2018-02-18 NOTE — Progress Notes (Signed)
Sent diflucan and prednisone for patient.  She continues to cough, and CXR shows Mild to moderate bronchitic changes. No pneumonia. Will treat with steroids.  Pt will return to clinic if no better in 6 days.

## 2018-02-18 NOTE — Telephone Encounter (Signed)
PT CALLED SAYING THAT SHE IS ON THE 5TH DAY OF HER 7 DAYS OF ABX AND THAT SHE FEELS WORSE. PT IS CONSISTENTLY COUGHING AND HAS WHEEZING. I SPOKE WITH ADAM AND HE SAID FOR PT TO GET A CHEST X-RAY AND THEN WE CAN PROCEED WITH MORE ABX IF NEEDED. PT ALSO CALLED ABOUT POSSIBILITY OF YEAST INFECTION.  CALLED PT BACK AND ADVISED HER TO GO TO THE OUTPATIENT IMAGING CENTER AND GET HER CHEST X-RAY DONE ASAP (ORDER PUT IN BY BETH) AND THEN WE CAN GET HER RESULTS AND PROCEED WITH TREATMENT.

## 2018-03-14 ENCOUNTER — Other Ambulatory Visit: Payer: Self-pay | Admitting: Internal Medicine

## 2018-03-16 ENCOUNTER — Other Ambulatory Visit: Payer: Self-pay

## 2018-03-16 MED ORDER — MONTELUKAST SODIUM 10 MG PO TABS: 10.0000 mg | ORAL_TABLET | Freq: Every day | ORAL | 3 refills | Status: DC

## 2018-03-16 MED ORDER — PANTOPRAZOLE SODIUM 40 MG PO TBEC: 40.0000 mg | DELAYED_RELEASE_TABLET | Freq: Every day | ORAL | 3 refills | Status: DC

## 2018-04-19 ENCOUNTER — Encounter: Payer: Self-pay | Admitting: Internal Medicine

## 2018-04-19 ENCOUNTER — Ambulatory Visit: Payer: Medicare HMO | Admitting: Internal Medicine

## 2018-04-19 VITALS — BP 130/74 | HR 80 | Temp 98.9°F | Resp 16 | Ht 63.5 in | Wt 243.0 lb

## 2018-04-19 DIAGNOSIS — K219 Gastro-esophageal reflux disease without esophagitis: Secondary | ICD-10-CM

## 2018-04-19 DIAGNOSIS — J45909 Unspecified asthma, uncomplicated: Secondary | ICD-10-CM | POA: Diagnosis not present

## 2018-04-19 DIAGNOSIS — J029 Acute pharyngitis, unspecified: Secondary | ICD-10-CM | POA: Diagnosis not present

## 2018-04-19 DIAGNOSIS — R0602 Shortness of breath: Secondary | ICD-10-CM

## 2018-04-19 DIAGNOSIS — Z9989 Dependence on other enabling machines and devices: Secondary | ICD-10-CM

## 2018-04-19 DIAGNOSIS — G4733 Obstructive sleep apnea (adult) (pediatric): Secondary | ICD-10-CM

## 2018-04-19 LAB — POCT RAPID STREP A (OFFICE): Rapid Strep A Screen: NEGATIVE

## 2018-04-19 NOTE — Patient Instructions (Signed)

## 2018-04-19 NOTE — Progress Notes (Signed)
[facility] [place], [place] [address]  Pulmonary Sleep Medicine   Office Visit Note  Patient Name: Danielle Delgado DOB: 1950-11-13 MRN [id number]  Date of Service: 04/19/2018  Complaints/HPI: Pt is here for pulmonary follow up on OSA and asthma.. Pt is also complaining of PND, sinus pressure and ear pressure. Overall she is doing well.  She is being told she needs another sinus surgery, however she is not willing to do that at this time.  She is cleaning her cpap machine regulary, and chanign the seal/tubiong as needed.         ROS  General: (-) fever, (-) chills, (-) night sweats, (-) weakness Skin: (-) rashes, (-) itching,. Eyes: (-) visual changes, (-) redness, (-) itching. Nose and Sinuses: (-) nasal stuffiness or itchiness, (-) postnasal drip, (-) nosebleeds, (-) sinus trouble. Mouth and Throat: (-) sore throat, (-) hoarseness. Neck: (-) swollen glands, (-) enlarged thyroid, (-) neck pain. Respiratory: + cough, (-) bloody sputum, - shortness of breath, - wheezing. Cardiovascular: - ankle swelling, (-) chest pain. Lymphatic: (-) lymph node enlargement. Neurologic: (-) numbness, (-) tingling. Psychiatric: (-) anxiety, (-) depression   Current Medication: Outpatient Encounter Medications as of 04/19/2018  Medication Sig Note  . ACCU-CHEK AVIVA PLUS test strip  01/17/2014: Received from: External Pharmacy  . acetaminophen (TYLENOL) 500 MG tablet Take 1,000 mg by mouth every 6 (six) hours as needed.   Marland Kitchen albuterol (PROAIR HFA) 108 (90 Base) MCG/ACT inhaler Inhale 2 puffs into the lungs every 6 (six) hours as needed for wheezing or shortness of breath.   Marland Kitchen amoxicillin-clavulanate (AUGMENTIN) 875-125 MG tablet Take 1 tablet by mouth 2 (two) times daily.   Marland Kitchen aspirin 325 MG EC tablet Take 81 mg by mouth daily.    Marland Kitchen atorvastatin (LIPITOR) 10 MG tablet  01/17/2014: Received from: External Pharmacy  . Biotin 5000 MCG TABS Take 1 tablet by mouth daily.   .  calcium carbonate (OS-CAL) 600 MG TABS tablet Take 600 mg by mouth 2 (two) times daily with a meal.   . cetirizine (ZYRTEC) 10 MG tablet Take 10 mg by mouth daily.   Marland Kitchen conjugated estrogens (PREMARIN) vaginal cream 1/2 gram intravaginal 2 times a week   . diclofenac sodium (VOLTAREN) 1 % GEL Apply 4 g topically 4 (four) times daily.   . ferrous sulfate 325 (65 FE) MG tablet Take 325 mg by mouth daily with breakfast.   . fluconazole (DIFLUCAN) 150 MG tablet Take 1 tablet (150 mg total) by mouth every three (3) days as needed.   Marland Kitchen glipiZIDE (GLUCOTROL XL) 5 MG 24 hr tablet Take 5 mg by mouth daily with breakfast.   . losartan-hydrochlorothiazide (HYZAAR) 50-12.5 MG per tablet  01/17/2014: Received from: External Pharmacy  . magnesium oxide (MAG-OX) 400 MG tablet Take 400 mg by mouth daily.   . metFORMIN (GLUCOPHAGE) 1000 MG tablet Take by mouth. 11/06/2014: Received from: [place]  . mometasone (ASMANEX 30 METERED DOSES) 220 MCG/INH inhaler Inhale into the lungs.   . montelukast (SINGULAIR) 10 MG tablet Take 1 tablet (10 mg total) by mouth daily.   . Omega-3 Fatty Acids (FISH OIL CONCENTRATE) 300 MG CAPS Take by mouth.  11/06/2014: Received from: [place]  . pantoprazole (PROTONIX) 40 MG tablet Take 1 tablet (40 mg total) by mouth daily.   . predniSONE (DELTASONE) 10 MG tablet Use per dose pack   . SYNTHROID 125 MCG tablet  01/17/2014: Received from: External  Pharmacy  . [DISCONTINUED] amoxicillin-clavulanate (AUGMENTIN) 875-125 MG tablet Take 1 tablet by mouth 2 (two) times daily.   . [DISCONTINUED] diclofenac sodium (VOLTAREN) 1 % GEL Apply 4 g topically 4 (four) times daily.   . [DISCONTINUED] montelukast (SINGULAIR) 10 MG tablet TAKE 1 TABLET ONE TIME DAILY   . [DISCONTINUED] pantoprazole (PROTONIX) 40 MG tablet TAKE 1 TABLET ONE TIME DAILY    No facility-administered encounter medications on file as of 04/19/2018.     Surgical History: Past Surgical  History:  Procedure Laterality Date  . BREAST EXCISIONAL BIOPSY Right 1996   neg  . BREAST SURGERY     biopsy  . CHOLECYSTECTOMY    . COLONOSCOPY WITH PROPOFOL N/A 10/12/2014   Procedure: COLONOSCOPY WITH PROPOFOL;  Surgeon: Manya Silvas, MD;  Location: [facility] ENDOSCOPY;  Service: Endoscopy;  Laterality: N/A;  . DILATION AND CURETTAGE OF UTERUS    . ESOPHAGOGASTRODUODENOSCOPY (EGD) WITH PROPOFOL N/A 10/12/2014   Procedure: ESOPHAGOGASTRODUODENOSCOPY (EGD) WITH PROPOFOL;  Surgeon: Manya Silvas, MD;  Location: [facility] ENDOSCOPY;  Service: Endoscopy;  Laterality: N/A;  . NASAL SINUS SURGERY    . VAGINAL HYSTERECTOMY  1982    Medical History: Past Medical History:  Diagnosis Date  . Asthma   . Chronic vaginitis   . Diabetes mellitus without complication ([place])   . Dyspareunia in female   . Eczema   . FH: breast cancer   . FH: colon cancer   . GERD (gastroesophageal reflux disease)   . Hiatus hernia syndrome   . Hyperlipidemia   . Hypertension   . Hypothyroidism   . Morbid obesity ([place])   . Postmenopausal   . Sleep apnea   . Superficial varicosities   . Vaginal atrophy     Family History: Family History  Problem Relation Age of Onset  . Osteoporosis Mother   . Breast cancer Maternal Aunt 60  . Diabetes Maternal Grandmother   . Diabetes Maternal Grandfather   . Heart disease Maternal Grandfather   . Colon cancer Cousin   . Breast cancer Cousin 54       mat cousin  . Ovarian cancer Neg Hx     Social History: Social History   Socioeconomic History  . Marital status: Married    Spouse name: Not on file  . Number of children: Not on file  . Years of education: Not on file  . Highest education level: Not on file  Occupational History  . Not on file  Social Needs  . Financial resource strain: Not on file  . Food insecurity:    Worry: Not on file    Inability: Not on file  . Transportation needs:    Medical: Not on file    Non-medical: Not on file  Tobacco  Use  . Smoking status: Never Smoker  . Smokeless tobacco: Never Used  Substance and Sexual Activity  . Alcohol use: No    Alcohol/week: 0.0 standard drinks  . Drug use: No  . Sexual activity: Not Currently  Lifestyle  . Physical activity:    Days per week: 2 days    Minutes per session: 30 min  . Stress: Not on file  Relationships  . Social connections:    Talks on phone: Not on file    Gets together: Not on file    Attends religious service: Not on file    Active member of club or organization: Not on file    Attends meetings of clubs or organizations: Not on file  Relationship status: Not on file  . Intimate partner violence:    Fear of current or ex partner: Not on file    Emotionally abused: Not on file    Physically abused: Not on file    Forced sexual activity: Not on file  Other Topics Concern  . Not on file  Social History Narrative  . Not on file    Vital Signs: Blood pressure 130/74, pulse 80, temperature 98.9 F (37.2 C), resp. rate 16, height 5' 3.5" (1.613 m), weight 243 lb (110.2 kg), SpO2 97 %.  Examination: General Appearance: The patient is well-developed, well-nourished, and in no distress. Skin: Gross inspection of skin unremarkable. Head: normocephalic, no gross deformities. Eyes: no gross deformities noted. ENT: ears appear grossly normal no exudates. Neck: Supple. No thyromegaly. No LAD. Respiratory: clear bilateally. Cardiovascular: Normal S1 and S2 without murmur or rub. Extremities: No cyanosis. pulses are equal. Neurologic: Alert and oriented. No involuntary movements.  LABS: Recent Results (from the past 2160 hour(s))  POCT rapid strep A     Status: None   Collection Time: 02/14/18  4:55 PM  Result Value Ref Range   Rapid Strep A Screen Negative Negative  POCT Influenza A/B     Status: None   Collection Time: 02/14/18  4:56 PM  Result Value Ref Range   Influenza A, POC Negative Negative   Influenza B, POC Negative Negative  POCT  rapid strep A     Status: Normal   Collection Time: 04/19/18  3:29 PM  Result Value Ref Range   Rapid Strep A Screen Negative Negative    Radiology: Dg Chest 2 View  Result Date: 02/18/2018 CLINICAL DATA:  Intermittent productive cough for the past week with wheezing and rattling. EXAM: CHEST - 2 VIEW COMPARISON:  None. FINDINGS: Borderline enlarged cardiac silhouette. Clear lungs. Mild-to-moderate diffuse peribronchial thickening. Mild thoracic spine degenerative changes. IMPRESSION: Mild to moderate bronchitic changes. Electronically Signed   By: Claudie Revering M.D.   On: 02/18/2018 15:45    No results found.  No results found.    Assessment and Plan: Patient Active Problem List   Diagnosis Date Noted  . Leg pain 10/19/2017  . Nocturnal leg cramps 10/19/2017  . Chronic venous insufficiency 03/02/2017  . Lymphedema 03/02/2017  . Increased BMI 07/10/2015  . Pelvic pain in female 07/10/2015  . Vaginal atrophy 07/10/2015  . Status post vaginal hysterectomy 07/10/2015   1. OSA on CPAP [place] to wear CPAP at night.  She will continue to clean machine as directed.  2. Uncomplicated asthma, unspecified asthma severity, unspecified whether persistent Stable, continue current occasions.  3. Morbid obesity ([place]) Obesity Counseling: Risk Assessment: An assessment of behavioral risk factors was made today and includes lack of exercise sedentary lifestyle, lack of portion control and poor dietary habits.  Risk Modification Advice: She was counseled on portion control guidelines. Restricting daily caloric intake to. . The detrimental long term effects of obesity on her health and ongoing poor compliance was also discussed with the patient.  4. Gastroesophageal reflux disease without esophagitis Stable, patient denies symptoms as long as she is able to take her medication.  5. Sore throat Negative rapid strep.  Her throat is likely sore due to postnasal drip and drainage.  Patient  will take over-the-counter medications for symptoms and will return to clinic or call if she develops a fever or any new or worsening symptoms. - POCT rapid strep A  6. Shortness of breath FVC 2.3 which is  77% of the pre-predicted value FEV1 is 1.8 which is 81% of the pre-predicted value FEV1/FVC is 79% which is 104% of the pre-predicted value. - Spirometry with Graph   General Counseling: I have discussed the findings of the evaluation and examination with Mariann Laster.  I have also discussed any further diagnostic evaluation thatmay be needed or ordered today. Joci verbalizes understanding of the findings of todays visit. We also reviewed her medications today and discussed drug interactions and side effects including but not limited excessive drowsiness and altered mental states. We also discussed that there is always a risk not just to her but also people around her. she has been encouraged to call the office with any questions or concerns that should arise related to todays visit.    Time spent: 25 This patient was seen by Orson Gear AGNP-C in Collaboration with Dr. Devona Konig as a part of collaborative care agreement.   I have personally obtained a history, examined the patient, evaluated laboratory and imaging results, formulated the assessment and plan and placed orders.    Allyne Gee, MD [facility] Pulmonary and Critical Care Sleep medicine

## 2018-04-22 ENCOUNTER — Other Ambulatory Visit: Payer: Self-pay | Admitting: Adult Health

## 2018-04-22 MED ORDER — FLUCONAZOLE 150 MG PO TABS: 150.0000 mg | ORAL_TABLET | ORAL | 0 refills | Status: DC | PRN

## 2018-04-22 MED ORDER — AMOXICILLIN-POT CLAVULANATE 875-125 MG PO TABS: 1.0000 | ORAL_TABLET | Freq: Two times a day (BID) | ORAL | 0 refills | Status: DC

## 2018-04-22 NOTE — Progress Notes (Signed)
PT continues to feel bad due to sinus symptoms. Diflucan and augmentin sent to her pharmacy.

## 2018-04-26 ENCOUNTER — Ambulatory Visit: Payer: Self-pay | Admitting: Internal Medicine

## 2018-05-09 ENCOUNTER — Ambulatory Visit (INDEPENDENT_AMBULATORY_CARE_PROVIDER_SITE_OTHER): Payer: Medicare HMO | Admitting: Adult Health

## 2018-05-09 ENCOUNTER — Encounter: Payer: Self-pay | Admitting: Adult Health

## 2018-05-09 VITALS — BP 176/79 | HR 94 | Temp 99.7°F | Resp 16 | Ht 63.0 in | Wt 241.0 lb

## 2018-05-09 DIAGNOSIS — Z9109 Other allergy status, other than to drugs and biological substances: Secondary | ICD-10-CM

## 2018-05-09 DIAGNOSIS — J029 Acute pharyngitis, unspecified: Secondary | ICD-10-CM | POA: Diagnosis not present

## 2018-05-09 LAB — POCT INFLUENZA A/B
Influenza A, POC: NEGATIVE
Influenza B, POC: NEGATIVE

## 2018-05-09 NOTE — Progress Notes (Signed)
[facility] [place], [place] [address]  Internal MEDICINE  Office Visit Note  Patient Name: Danielle Delgado  [id number]  [id number]  Date of Service: 05/09/2018  Chief Complaint  Patient presents with  . Sore Throat  . Fever  . Headache     HPI Pt is here for a sick visit. Pt reports 24 hours of sore throat, low grade fever and headache.  She also reports PND, and runny nose. She has not been traveling recently.  She denies any sick contacts at this time.       Current Medication:  Outpatient Encounter Medications as of 05/09/2018  Medication Sig Note  . ACCU-CHEK AVIVA PLUS test strip  01/17/2014: Received from: External Pharmacy  . acetaminophen (TYLENOL) 500 MG tablet Take 1,000 mg by mouth every 6 (six) hours as needed.   Marland Kitchen albuterol (PROAIR HFA) 108 (90 Base) MCG/ACT inhaler Inhale 2 puffs into the lungs every 6 (six) hours as needed for wheezing or shortness of breath.   Marland Kitchen aspirin 325 MG EC tablet Take 81 mg by mouth daily.    Marland Kitchen atorvastatin (LIPITOR) 10 MG tablet  01/17/2014: Received from: External Pharmacy  . Biotin 5000 MCG TABS Take 1 tablet by mouth daily.   . calcium carbonate (OS-CAL) 600 MG TABS tablet Take 600 mg by mouth 2 (two) times daily with a meal.   . cetirizine (ZYRTEC) 10 MG tablet Take 10 mg by mouth daily.   Marland Kitchen conjugated estrogens (PREMARIN) vaginal cream 1/2 gram intravaginal 2 times a week   . diclofenac sodium (VOLTAREN) 1 % GEL Apply 4 g topically 4 (four) times daily.   . ferrous sulfate 325 (65 FE) MG tablet Take 325 mg by mouth daily with breakfast.   . fluconazole (DIFLUCAN) 150 MG tablet Take 1 tablet (150 mg total) by mouth every three (3) days as needed.   . fluconazole (DIFLUCAN) 150 MG tablet Take 1 tablet (150 mg total) by mouth every three (3) days as needed.   Marland Kitchen glipiZIDE (GLUCOTROL XL) 5 MG 24 hr tablet Take 5 mg by mouth daily with breakfast.   . losartan-hydrochlorothiazide (HYZAAR) 50-12.5 MG per tablet   01/17/2014: Received from: External Pharmacy  . magnesium oxide (MAG-OX) 400 MG tablet Take 400 mg by mouth daily.   . metFORMIN (GLUCOPHAGE) 1000 MG tablet Take by mouth. 11/06/2014: Received from: [facility]  . mometasone (ASMANEX 30 METERED DOSES) 220 MCG/INH inhaler Inhale into the lungs.   . montelukast (SINGULAIR) 10 MG tablet Take 1 tablet (10 mg total) by mouth daily.   . Omega-3 Fatty Acids (FISH OIL CONCENTRATE) 300 MG CAPS Take by mouth.  11/06/2014: Received from: [employer]  . pantoprazole (PROTONIX) 40 MG tablet Take 1 tablet (40 mg total) by mouth daily.   Marland Kitchen SYNTHROID 125 MCG tablet  01/17/2014: Received from: External Pharmacy  . [DISCONTINUED] amoxicillin-clavulanate (AUGMENTIN) 875-125 MG tablet Take 1 tablet by mouth 2 (two) times daily. (Patient not taking: Reported on 05/09/2018)   . [DISCONTINUED] amoxicillin-clavulanate (AUGMENTIN) 875-125 MG tablet Take 1 tablet by mouth 2 (two) times daily. (Patient not taking: Reported on 05/09/2018)   . [DISCONTINUED] predniSONE (DELTASONE) 10 MG tablet Use per dose pack (Patient not taking: Reported on 05/09/2018)    No facility-administered encounter medications on file as of 05/09/2018.       Medical History: Past Medical History:  Diagnosis Date  . Asthma   . Chronic vaginitis   . Diabetes  mellitus without complication ([place])   . Dyspareunia in female   . Eczema   . FH: breast cancer   . FH: colon cancer   . GERD (gastroesophageal reflux disease)   . Hiatus hernia syndrome   . Hyperlipidemia   . Hypertension   . Hypothyroidism   . Morbid obesity ([place])   . Postmenopausal   . Sleep apnea   . Superficial varicosities   . Vaginal atrophy      Vital Signs: BP (!) 176/79   Pulse 94   Temp 99.7 F (37.6 C) (Oral)   Resp 16   Ht 5\' 3"  (1.6 m)   Wt 241 lb (109.3 kg)   SpO2 95%   BMI 42.69 kg/m    Review of Systems  Constitutional: Negative for chills, fatigue and unexpected weight  change.  HENT: Positive for postnasal drip and rhinorrhea. Negative for congestion, sneezing and sore throat.   Eyes: Negative for photophobia, pain and redness.  Respiratory: Positive for cough. Negative for chest tightness and shortness of breath.   Cardiovascular: Negative for chest pain and palpitations.  Gastrointestinal: Negative for abdominal pain, constipation, diarrhea, nausea and vomiting.  Endocrine: Negative.   Genitourinary: Negative for dysuria and frequency.  Musculoskeletal: Negative for arthralgias, back pain, joint swelling and neck pain.  Skin: Negative for rash.  Allergic/Immunologic: Negative.   Neurological: Negative for tremors and numbness.  Hematological: Negative for adenopathy. Does not bruise/bleed easily.  Psychiatric/Behavioral: Negative for behavioral problems and sleep disturbance. The patient is not nervous/anxious.     Physical Exam Vitals signs and nursing note reviewed.  Constitutional:      General: She is not in acute distress.    Appearance: She is well-developed. She is not diaphoretic.  HENT:     Head: Normocephalic and atraumatic.     Mouth/Throat:     Pharynx: No oropharyngeal exudate.  Eyes:     Pupils: Pupils are equal, round, and reactive to [place].  Neck:     Musculoskeletal: Normal range of motion and neck supple.     Thyroid: No thyromegaly.     Vascular: No JVD.     Trachea: No tracheal deviation.  Cardiovascular:     Rate and Rhythm: Normal rate and regular rhythm.     Heart sounds: Normal heart sounds. No murmur. No friction rub. No gallop.   Pulmonary:     Effort: Pulmonary effort is normal. No respiratory distress.     Breath sounds: Normal breath sounds. No wheezing or rales.  Chest:     Chest wall: No tenderness.  Abdominal:     Palpations: Abdomen is soft.     Tenderness: There is no abdominal tenderness. There is no guarding.  Musculoskeletal: Normal range of motion.  Lymphadenopathy:     Cervical: No cervical  adenopathy.  Skin:    General: Skin is warm and dry.  Neurological:     Mental Status: She is alert and oriented to person, place, and time.     Cranial Nerves: No cranial nerve deficit.  Psychiatric:        Behavior: Behavior normal.        Thought Content: Thought content normal.        Judgment: Judgment normal.    Assessment/Plan: 1. Environmental allergies Use Flonase as discussed, and continue Singulair.  If symptoms worsen or change, call office for antibiotics.   2. Sore throat negative - POCT Influenza A/B  General Counseling: Saren verbalizes understanding of the findings of todays visit  and agrees with plan of treatment. I have discussed any further diagnostic evaluation that may be needed or ordered today. We also reviewed her medications today. she has been encouraged to call the office with any questions or concerns that should arise related to todays visit.   Orders Placed This Encounter  Procedures  . POCT Influenza A/B    No orders of the defined types were placed in this encounter.   Time spent: 25 Minutes  This patient was seen by Orson Gear AGNP-C in Collaboration with Dr Lavera Guise as a part of collaborative care agreement.  Kendell Bane AGNP-C Internal Medicine

## 2018-05-09 NOTE — Patient Instructions (Signed)
Allergies, Adult  An allergy means that your body reacts to something that bothers it (allergen). It is not a normal reaction. This can happen from something that you:   Eat.   Breathe in.   Touch.  You can have an allergy (be allergic) to:   Outdoor things, like:  ? Pollen.  ? Grass.  ? Weeds.   Indoor things, like:  ? Dust.  ? Smoke.  ? Pet dander.   Foods.   Medicines.   Things that bother your skin, like:  ? Detergents.  ? Chemicals.  ? Latex.   Perfume.   Bugs.  An allergy cannot spread from person to person (is not contagious).  Follow these instructions at home:               Stay away from things that you know you are allergic to.   If you have allergies to things in the air, wash out your nose each day. Do it with one of these:  ? A salt-water (saline) spray.  ? A container (neti pot).   Take over-the-counter and prescription medicines only as told by your doctor.   Keep all follow-up visits as told by your doctor. This is important.   If you are at risk for a very bad allergy reaction (anaphylaxis), keep an auto-injector with you all the time. This is called an epinephrine injection.  ? This is pre-measured medicine with a needle. You can put it into your skin by yourself.  ? Right after you have a very bad allergy reaction, you or a person with you must give the medicine in less than a few minutes. This is an emergency.   If you have ever had a very bad allergy reaction, wear a medical alert bracelet or necklace. Your very bad allergy should be written on it.  Contact a health care provider if:   Your symptoms do not get better with treatment.  Get help right away if:   You have symptoms of a very bad allergy reaction. These include:  ? A swollen mouth, tongue, or throat.  ? Pain or tightness in your chest.  ? Trouble breathing.  ? Being short of breath.  ? Dizziness.  ? Fainting.  ? Very bad pain in your belly (abdomen).  ? Throwing up (vomiting).  ? Watery poop  (diarrhea).  Summary   An allergy means that your body reacts to something that bothers it (allergen). It is not a normal reaction.   Stay away from things that make your body react.   Take over-the-counter and prescription medicines only as told by your doctor.   If you are at risk for a very bad allergy reaction, carry an auto-injector (epinephrine injection) all the time. Also, wear a medical alert bracelet or necklace so people know about your allergy.  This information is not intended to replace advice given to you by your health care provider. Make sure you discuss any questions you have with your health care provider.  Document Released: 06/13/2012 Document Revised: 06/01/2016 Document Reviewed: 06/01/2016  Elsevier Interactive Patient Education  2019 [employer] Inc[employer]

## 2018-05-18 ENCOUNTER — Ambulatory Visit: Payer: Medicare HMO | Admitting: Podiatry

## 2018-07-20 ENCOUNTER — Encounter: Payer: Medicare HMO | Admitting: Obstetrics and Gynecology

## 2018-08-17 ENCOUNTER — Ambulatory Visit: Payer: Medicare HMO | Admitting: Podiatry

## 2018-08-17 ENCOUNTER — Encounter: Payer: Self-pay | Admitting: Podiatry

## 2018-08-17 ENCOUNTER — Other Ambulatory Visit: Payer: Self-pay

## 2018-08-17 VITALS — Temp 96.8°F

## 2018-08-17 DIAGNOSIS — E119 Type 2 diabetes mellitus without complications: Secondary | ICD-10-CM | POA: Diagnosis not present

## 2018-08-17 DIAGNOSIS — M2040 Other hammer toe(s) (acquired), unspecified foot: Secondary | ICD-10-CM

## 2018-08-17 DIAGNOSIS — I872 Venous insufficiency (chronic) (peripheral): Secondary | ICD-10-CM

## 2018-08-17 NOTE — Progress Notes (Signed)
She presents today for her diabetic exam I would like to consider getting a new pair of diabetic shoes.  She has no problems with her feet other than some burning and tingling in her toes.  Objective: Vital signs are stable she is alert and oriented x3 pulses are palpable.  Neurologic sensorium does not demonstrate a deficit as of yet though her history does demonstrate some early diabetic peripheral neuropathy.  Deep tendon reflexes are intact muscle strength is normal symmetrical bilateral.  Orthopedic evaluation demonstrates all joints distal to the ankle level full range of motion without crepitation.  Cutaneous evaluation demonstrates supple well-hydrated cutis no erythema edema cellulitis drainage odor no open lesions or wounds.  Assessment: Diabetes with early diabetic peripheral neuropathy.  Plan: We will see about getting her a new pair of diabetic shoes.

## 2018-08-29 ENCOUNTER — Telehealth: Payer: Self-pay

## 2018-08-29 NOTE — Telephone Encounter (Signed)
[facility] for prescreening.  

## 2018-08-29 NOTE — Telephone Encounter (Signed)
Coronavirus (COVID-19) Are you at risk?  Are you at risk for the Coronavirus (COVID-19)?  To be considered HIGH RISK for Coronavirus (COVID-19), you have to meet the following criteria:  . Traveled to [place], [place], [place], [place] or [place]; or in the [place] to [place], [place], [place], or [place]; and have fever, cough, and shortness of breath within the last 2 weeks of travel OR . Been in close contact with a person diagnosed with COVID-19 within the last 2 weeks and have fever, cough, and shortness of breath . IF YOU DO NOT MEET THESE CRITERIA, YOU ARE CONSIDERED LOW RISK FOR COVID-19.  What to do if you are HIGH RISK for COVID-19?  . If you are having a medical emergency, call 911. . Seek medical care right away. Before you go to a doctor's office, urgent care or emergency department, call ahead and tell them about your recent travel, contact with someone diagnosed with COVID-19, and your symptoms. You should receive instructions from your physician's office regarding next steps of care.  . When you arrive at healthcare provider, tell the healthcare staff immediately you have returned from visiting [place], [place], [place], [place] or [place]; or traveled in the [place] to [place], [place], [place], or [place]; in the last two weeks or you have been in close contact with a person diagnosed with COVID-19 in the last 2 weeks.   . Tell the health care staff about your symptoms: fever, cough and shortness of breath. . After you have been seen by a medical provider, you will be either: o Tested for (COVID-19) and discharged home on quarantine except to seek medical care if symptoms worsen, and asked to  - Stay home and avoid contact with others until you get your results (4-5 days)  - Avoid travel on public transportation if possible (such as bus, train, or airplane) or o Sent to the Emergency Department by EMS for evaluation, COVID-19 testing, and possible  admission depending on your condition and test results.  What to do if you are LOW RISK for COVID-19?  Reduce your risk of any infection by using the same precautions used for avoiding the common cold or flu:  . Wash your hands often with soap and warm water for at least 20 seconds.  If soap and water are not readily available, use an alcohol-based hand sanitizer with at least 60% alcohol.  . If coughing or sneezing, cover your mouth and nose by coughing or sneezing into the elbow areas of your shirt or coat, into a tissue or into your sleeve (not your hands). . Avoid shaking hands with others and consider head nods or verbal greetings only. . Avoid touching your eyes, nose, or mouth with unwashed hands.  . Avoid close contact with people who are [place]. . Avoid places or events with large numbers of people in one location, like concerts or sporting events. . Carefully consider travel plans you have or are making. . If you are planning any travel outside or inside the [place], visit the CDC's Travelers' Health webpage for the latest health notices. . If you have some symptoms but not all symptoms, continue to monitor at home and seek medical attention if your symptoms worsen. . If you are having a medical emergency, call 911.  08/29/18 SCREENING NEG SLS ADDITIONAL HEALTHCARE OPTIONS FOR PATIENTS  [facility] / e-Visit: [email or web address]         [facility]: [phone]    [facility]: [phone]                   [facility]: [phone]  

## 2018-08-30 ENCOUNTER — Other Ambulatory Visit: Payer: Self-pay

## 2018-08-30 ENCOUNTER — Encounter: Payer: Self-pay | Admitting: Obstetrics and Gynecology

## 2018-08-30 ENCOUNTER — Ambulatory Visit (INDEPENDENT_AMBULATORY_CARE_PROVIDER_SITE_OTHER): Payer: Medicare HMO | Admitting: Obstetrics and Gynecology

## 2018-08-30 ENCOUNTER — Telehealth: Payer: Self-pay | Admitting: Obstetrics and Gynecology

## 2018-08-30 VITALS — BP 146/91 | HR 87 | Ht 63.0 in | Wt 235.0 lb

## 2018-08-30 DIAGNOSIS — Z01419 Encounter for gynecological examination (general) (routine) without abnormal findings: Secondary | ICD-10-CM | POA: Diagnosis not present

## 2018-08-30 DIAGNOSIS — Z87898 Personal history of other specified conditions: Secondary | ICD-10-CM

## 2018-08-30 DIAGNOSIS — Z780 Asymptomatic menopausal state: Secondary | ICD-10-CM

## 2018-08-30 DIAGNOSIS — N952 Postmenopausal atrophic vaginitis: Secondary | ICD-10-CM

## 2018-08-30 DIAGNOSIS — Z1239 Encounter for other screening for malignant neoplasm of breast: Secondary | ICD-10-CM | POA: Diagnosis not present

## 2018-08-30 DIAGNOSIS — Z8709 Personal history of other diseases of the respiratory system: Secondary | ICD-10-CM

## 2018-08-30 DIAGNOSIS — Z6841 Body Mass Index (BMI) 40.0 and over, adult: Secondary | ICD-10-CM

## 2018-08-30 DIAGNOSIS — R399 Unspecified symptoms and signs involving the genitourinary system: Secondary | ICD-10-CM

## 2018-08-30 LAB — POCT URINALYSIS DIPSTICK
Bilirubin, UA: NEGATIVE
Blood, UA: NEGATIVE
Glucose, UA: NEGATIVE
Ketones, UA: NEGATIVE
Leukocytes, UA: NEGATIVE
Nitrite, UA: NEGATIVE
Protein, UA: NEGATIVE
Spec Grav, UA: 1.01 (ref 1.010–1.025)
Urobilinogen, UA: 0.2 E.U./dL
pH, UA: 6 (ref 5.0–8.0)

## 2018-08-30 NOTE — Progress Notes (Signed)
Pt is present today for annual exam. Pt stated that she was doing well other than having some itching and burning in the vaginal area.

## 2018-08-30 NOTE — Telephone Encounter (Signed)
The patient called and stated that her visit has to be coded correctly as "Annual Wellness visit" In order for insurance to cover the visit. Please advise.   *Pt stated she experienced issues with this the past two years*

## 2018-08-30 NOTE — Progress Notes (Signed)
ANNUAL PREVENTATIVE CARE GYNECOLOGY  ENCOUNTER NOTE  Subjective:       Danielle Delgado is a 68 y.o. G60P0101 married female here for a routine annual gynecologic exam. The patient is sexually active (occasionally). The patient is not taking hormone replacement therapy. Patient denies post-menopausal vaginal bleeding. The patient wears seatbelts: yes. The patient participates in regular exercise: no. Has the patient ever been transfused or tattooed?: no. The patient reports that there is not domestic violence in her life.   Current complaints: 1.  Vaginal dryness.  Patient notes that she was given samples of Premarin cream a "while ago" but never really used it.  Notes that she is willing to try now.   2. Patient requests COVID antibody testing. Notes significant respiratory illness in December/January that was difficult to diagnose.  Curious if she could have possibly had the virus at that time.  3. Patient is noting some mild UTI symptoms (urinary frequency)   Gynecologic History No LMP recorded. Patient has had a hysterectomy. Last Pap: not required.  Patient has had a hysterectomy.  Last mammogram: 10/2017. Results were: normal Last Colonoscopy: within the past 5 years.    Obstetric History OB History  Gravida Para Term Preterm AB Living  1 1   1   1   SAB TAB Ectopic Multiple Live Births          1    # Outcome Date GA Lbr Len/2nd Weight Sex Delivery Anes PTL Lv  1 Preterm    5 lb (2.268 kg)  Vag-Spont   LIV    Past Medical History:  Diagnosis Date  . Asthma   . Chronic vaginitis   . Diabetes mellitus without complication ([place])   . Dyspareunia in female   . Eczema   . FH: breast cancer   . FH: colon cancer   . GERD (gastroesophageal reflux disease)   . Hiatus hernia syndrome   . Hyperlipidemia   . Hypertension   . Hypothyroidism   . Morbid obesity ([place])   . Postmenopausal   . Sleep apnea   . Superficial varicosities   . Vaginal atrophy     Family History   Problem Relation Age of Onset  . Osteoporosis Mother   . Breast cancer Maternal Aunt 60  . Diabetes Maternal Grandmother   . Diabetes Maternal Grandfather   . Heart disease Maternal Grandfather   . Colon cancer Cousin   . Breast cancer Cousin 19       mat cousin  . Ovarian cancer Neg Hx     Past Surgical History:  Procedure Laterality Date  . BREAST EXCISIONAL BIOPSY Right 1996   neg  . BREAST SURGERY     biopsy  . CHOLECYSTECTOMY    . COLONOSCOPY WITH PROPOFOL N/A 10/12/2014   Procedure: COLONOSCOPY WITH PROPOFOL;  Surgeon: Manya Silvas, MD;  Location: [facility] ENDOSCOPY;  Service: Endoscopy;  Laterality: N/A;  . DILATION AND CURETTAGE OF UTERUS    . ESOPHAGOGASTRODUODENOSCOPY (EGD) WITH PROPOFOL N/A 10/12/2014   Procedure: ESOPHAGOGASTRODUODENOSCOPY (EGD) WITH PROPOFOL;  Surgeon: Manya Silvas, MD;  Location: [facility] ENDOSCOPY;  Service: Endoscopy;  Laterality: N/A;  . NASAL SINUS SURGERY    . VAGINAL HYSTERECTOMY  1982    Social History   Socioeconomic History  . Marital status: Married    Spouse name: Not on file  . Number of children: Not on file  . Years of education: Not on file  . Highest education level: Not on file  Occupational History  . Not on file  Social Needs  . Financial resource strain: Not on file  . Food insecurity    Worry: Not on file    Inability: Not on file  . Transportation needs    Medical: Not on file    Non-medical: Not on file  Tobacco Use  . Smoking status: Never Smoker  . Smokeless tobacco: Never Used  Substance and Sexual Activity  . Alcohol use: No    Alcohol/week: 0.0 standard drinks  . Drug use: No  . Sexual activity: Not Currently  Lifestyle  . Physical activity    Days per week: 2 days    Minutes per session: 30 min  . Stress: Not on file  Relationships  . Social Herbalist on phone: Not on file    Gets together: Not on file    Attends religious service: Not on file    Active member of club or organization:  Not on file    Attends meetings of clubs or organizations: Not on file    Relationship status: Not on file  . Intimate partner violence    Fear of current or ex partner: Not on file    Emotionally abused: Not on file    Physically abused: Not on file    Forced sexual activity: Not on file  Other Topics Concern  . Not on file  Social History Narrative  . Not on file    Current Outpatient Medications on File Prior to Visit  Medication Sig Dispense Refill  . ACCU-CHEK AVIVA PLUS test strip     . acetaminophen (TYLENOL) 500 MG tablet Take 1,000 mg by mouth every 6 (six) hours as needed.    Marland Kitchen acetaminophen (TYLENOL) 650 MG CR tablet Take 650 mg by mouth every 8 (eight) hours as needed for pain.    Marland Kitchen albuterol (PROAIR HFA) 108 (90 Base) MCG/ACT inhaler Inhale 2 puffs into the lungs every 6 (six) hours as needed for wheezing or shortness of breath. 8 g 0  . atorvastatin (LIPITOR) 10 MG tablet     . Biotin 5000 MCG TABS Take 1 tablet by mouth daily.    . calcium carbonate (OS-CAL) 600 MG TABS tablet Take 600 mg by mouth 2 (two) times daily with a meal.    . cetirizine (ZYRTEC) 10 MG tablet Take 10 mg by mouth daily.    . diclofenac sodium (VOLTAREN) 1 % GEL Apply 4 g topically 4 (four) times daily. 100 g 2  . glipiZIDE (GLUCOTROL XL) 5 MG 24 hr tablet Take 5 mg by mouth daily with breakfast.    . levothyroxine (SYNTHROID) 112 MCG tablet     . losartan-hydrochlorothiazide (HYZAAR) 50-12.5 MG per tablet     . magnesium oxide (MAG-OX) 400 MG tablet Take 400 mg by mouth daily.    . metFORMIN (GLUCOPHAGE) 1000 MG tablet Take by mouth.    . mometasone (ASMANEX 30 METERED DOSES) 220 MCG/INH inhaler Inhale into the lungs.    . montelukast (SINGULAIR) 10 MG tablet Take 1 tablet (10 mg total) by mouth daily. 90 tablet 3  . Omega-3 Fatty Acids (FISH OIL CONCENTRATE) 300 MG CAPS Take by mouth.     . pantoprazole (PROTONIX) 40 MG tablet Take 1 tablet (40 mg total) by mouth daily. 90 tablet 3  . aspirin  325 MG EC tablet Take 81 mg by mouth daily.     Marland Kitchen conjugated estrogens (PREMARIN) vaginal cream 1/2 gram intravaginal 2 times  a week 60 g 1  . ferrous sulfate 325 (65 FE) MG tablet Take 325 mg by mouth daily with breakfast.    . fluconazole (DIFLUCAN) 150 MG tablet Take 1 tablet (150 mg total) by mouth every three (3) days as needed. 3 tablet 1  . fluconazole (DIFLUCAN) 150 MG tablet Take 1 tablet (150 mg total) by mouth every three (3) days as needed. 3 tablet 0   No current facility-administered medications on file prior to visit.     Allergies  Allergen Reactions  . Biaxin [Clarithromycin]   . Propulsid [Cisapride] Hives  . Sulfa Antibiotics Hives      Review of Systems ROS Review of Systems - General ROS: negative for - chills, fatigue, fever, hot flashes, night sweats, weight gain or weight loss Psychological ROS: negative for - anxiety, decreased libido, depression, mood swings, physical abuse or sexual abuse Ophthalmic ROS: negative for - blurry vision, eye pain or loss of vision ENT ROS: negative for - headaches, hearing change, visual changes or vocal changes Allergy and Immunology ROS: negative for - hives, itchy/watery eyes or seasonal allergies Hematological and Lymphatic ROS: negative for - bleeding problems, bruising, swollen lymph nodes or weight loss Endocrine ROS: negative for - galactorrhea, hair pattern changes, hot flashes, malaise/lethargy, mood swings, palpitations, polydipsia/polyuria, skin changes, temperature intolerance or unexpected weight changes Breast ROS: negative for - new or changing breast lumps or nipple discharge Respiratory ROS: negative for - cough or shortness of breath Cardiovascular ROS: negative for - chest pain, irregular heartbeat, palpitations or shortness of breath Gastrointestinal ROS: no abdominal pain, change in bowel habits, or black or bloody stools Genito-Urinary ROS: no dysuria, trouble voiding, or hematuria. Positive - Vaginal  dryness.  Musculoskeletal ROS: negative for - joint pain or joint stiffness Neurological ROS: negative for - bowel and bladder control changes Dermatological ROS: negative for rash and skin lesion changes   Objective:   BP (!) 146/91   Pulse 87   Ht 5\' 3"  (1.6 m)   Wt 235 lb (106.6 kg)   BMI 41.63 kg/m  CONSTITUTIONAL: Well-developed, well-nourished female in no acute distress. Morbid obesity PSYCHIATRIC: Normal mood and affect. Normal behavior. Normal judgment and thought content. [place]: Alert and oriented to person, place, and time. Normal muscle tone coordination. No cranial nerve deficit noted. HENT:  Normocephalic, atraumatic, External right and left ear normal. Oropharynx is clear and moist EYES: Conjunctivae and EOM are normal. Pupils are equal, round, and reactive to Spaid. No scleral icterus.  NECK: Normal range of motion, supple, no masses.  Normal thyroid.  SKIN: Skin is warm and dry. No rash noted. Not diaphoretic. No erythema. No pallor. CARDIOVASCULAR: Normal heart rate noted, regular rhythm, no murmur. RESPIRATORY: Clear to auscultation bilaterally. Effort and breath sounds normal, no problems with respiration noted. BREASTS: Symmetric in size. No masses, skin changes, nipple drainage, or lymphadenopathy. ABDOMEN: Soft, normal bowel sounds, no distention noted.  No tenderness, rebound or guarding.  BLADDER: Normal PELVIC:  Bladder no bladder distension noted  Urethra: normal appearing urethra with no masses, tenderness or lesions  Vulva: normal appearing vulva with no masses, tenderness or lesions  Vagina: normal appearing vagina with discharge, atrophy   Cervix: surgically absent  Uterus: surgically absent, vaginal cuff well healed  Adnexa: normal adnexa in size, nontender and no masses  RV: External Exam NormaI  MUSCULOSKELETAL: Normal range of motion. No tenderness.  No cyanosis, clubbing, or edema.  2+ distal pulses. LYMPHATIC: No Axillary, Supraclavicular,  or Inguinal  Adenopathy.   Labs: Last labs in 2018 (noted in [place])   Results for orders placed or performed in visit on 08/30/18  POCT urinalysis dipstick  Result Value Ref Range   Color, UA yellow    Clarity, UA clear    Glucose, UA Negative Negative   Bilirubin, UA neg    Ketones, UA neg    Spec Grav, UA 1.010 1.010 - 1.025   Blood, UA neg    pH, UA 6.0 5.0 - 8.0   Protein, UA Negative Negative   Urobilinogen, UA 0.2 0.2 or 1.0 E.U./dL   Nitrite, UA neg    Leukocytes, UA Negative Negative   Appearance yellow    Odor      Assessment:   Well woman exam  Vaginal atrophy Screening for breast cancer  Morbid obesity with BMI of 40.0-44.9, adult (HCC)  Menopause  UTI symptoms  History of persistent cough, desires COVID testing  Plan:  Pap: Not needed Mammogram: Ordered Stool Guaiac Testing:  Not Indicated, patient up to date on colonoscopy Labs: No labs ordered. To be performed with PCP.  Routine preventative health maintenance measures emphasized: Exercise/Diet/Weight control, Alcohol/Substance use risks and Stress Management Vaginal atrophy present. Patient notes she would like to try premarin cream again, given samples. Advised to use twice weekly. Can call if prescription desired.  UTI symptoms but UA negative. Encouraged adequate hydration/cranberry juice.  Return to [place], or sooner as needed.     Rubie Maid, MD  [facility]

## 2018-08-30 NOTE — Patient Instructions (Signed)

## 2018-08-31 LAB — SAR COV2 SEROLOGY (COVID19)AB(IGG),IA: SARS-CoV-2 Ab, IgG: NEGATIVE

## 2018-10-05 ENCOUNTER — Other Ambulatory Visit: Payer: Self-pay

## 2018-10-05 ENCOUNTER — Ambulatory Visit: Payer: Medicare HMO | Admitting: Orthotics

## 2018-10-05 DIAGNOSIS — M2040 Other hammer toe(s) (acquired), unspecified foot: Secondary | ICD-10-CM

## 2018-10-05 DIAGNOSIS — E119 Type 2 diabetes mellitus without complications: Secondary | ICD-10-CM

## 2018-10-05 NOTE — Progress Notes (Signed)
Adjusted diabetic insert by removing materal from bottom.Marland Kitchen

## 2018-10-13 ENCOUNTER — Encounter: Payer: Self-pay | Admitting: Internal Medicine

## 2018-10-13 ENCOUNTER — Ambulatory Visit: Payer: Medicare HMO | Admitting: Internal Medicine

## 2018-10-13 ENCOUNTER — Other Ambulatory Visit: Payer: Self-pay

## 2018-10-13 VITALS — BP 138/78 | HR 80 | Resp 16 | Ht 64.0 in | Wt 239.0 lb

## 2018-10-13 DIAGNOSIS — R0602 Shortness of breath: Secondary | ICD-10-CM

## 2018-10-13 DIAGNOSIS — G4733 Obstructive sleep apnea (adult) (pediatric): Secondary | ICD-10-CM | POA: Diagnosis not present

## 2018-10-13 DIAGNOSIS — J45909 Unspecified asthma, uncomplicated: Secondary | ICD-10-CM | POA: Diagnosis not present

## 2018-10-13 DIAGNOSIS — Z9109 Other allergy status, other than to drugs and biological substances: Secondary | ICD-10-CM | POA: Diagnosis not present

## 2018-10-13 DIAGNOSIS — K219 Gastro-esophageal reflux disease without esophagitis: Secondary | ICD-10-CM

## 2018-10-13 DIAGNOSIS — Z9989 Dependence on other enabling machines and devices: Secondary | ICD-10-CM

## 2018-10-13 NOTE — Progress Notes (Signed)
[facility] [place], [place] [address]  Pulmonary Sleep Medicine   Office Visit Note  Patient Name: Danielle Delgado DOB: 08-14-1950 MRN [id number]  Date of Service: 10/13/2018  Complaints/HPI: Pt is here for pulmonary follow up.  She has a history of copd, allergies, and gerd.  She denies any worsening issues.  Pt reports increased sinus symptoms from allergies over the last few weeks. She is using zyrtec and Singulair as well as intermittent Flonase. Pt is using asthmanex also.     ROS  General: (-) fever, (-) chills, (-) night sweats, (-) weakness Skin: (-) rashes, (-) itching,. Eyes: (-) visual changes, (-) redness, (-) itching. Nose and Sinuses: (-) nasal stuffiness or itchiness, (-) postnasal drip, (-) nosebleeds, (-) sinus trouble. Mouth and Throat: (-) sore throat, (-) hoarseness. Neck: (-) swollen glands, (-) enlarged thyroid, (-) neck pain. Respiratory: - cough, (-) bloody sputum, - shortness of breath, - wheezing. Cardiovascular: - ankle swelling, (-) chest pain. Lymphatic: (-) lymph node enlargement. Neurologic: (-) numbness, (-) tingling. Psychiatric: (-) anxiety, (-) depression   Current Medication: Outpatient Encounter Medications as of 10/13/2018  Medication Sig Note  . ACCU-CHEK AVIVA PLUS test strip  01/17/2014: Received from: External Pharmacy  . acetaminophen (TYLENOL) 500 MG tablet Take 1,000 mg by mouth every 6 (six) hours as needed.   Marland Kitchen acetaminophen (TYLENOL) 650 MG CR tablet Take 650 mg by mouth every 8 (eight) hours as needed for pain.   Marland Kitchen albuterol (PROAIR HFA) 108 (90 Base) MCG/ACT inhaler Inhale 2 puffs into the lungs every 6 (six) hours as needed for wheezing or shortness of breath.   Marland Kitchen aspirin EC 81 MG tablet Take 81 mg by mouth daily.   Marland Kitchen atorvastatin (LIPITOR) 10 MG tablet  01/17/2014: Received from: External Pharmacy  . Biotin 5000 MCG TABS Take 1 tablet by mouth daily.   . calcium carbonate (OS-CAL) 600 MG TABS tablet  Take 600 mg by mouth 2 (two) times daily with a meal.   . cetirizine (ZYRTEC) 10 MG tablet Take 10 mg by mouth daily.   . diclofenac sodium (VOLTAREN) 1 % GEL Apply 4 g topically 4 (four) times daily.   . ferrous sulfate 325 (65 FE) MG tablet Take 325 mg by mouth daily with breakfast.   . glipiZIDE (GLUCOTROL XL) 5 MG 24 hr tablet Take 5 mg by mouth daily with breakfast.   . levothyroxine (SYNTHROID) 112 MCG tablet    . losartan-hydrochlorothiazide (HYZAAR) 50-12.5 MG per tablet  01/17/2014: Received from: External Pharmacy  . magnesium oxide (MAG-OX) 400 MG tablet Take 400 mg by mouth daily.   . metFORMIN (GLUCOPHAGE) 1000 MG tablet Take by mouth. 11/06/2014: Received from: [place]  . mometasone (ASMANEX 30 METERED DOSES) 220 MCG/INH inhaler Inhale into the lungs.   . montelukast (SINGULAIR) 10 MG tablet Take 1 tablet (10 mg total) by mouth daily.   . Omega-3 Fatty Acids (FISH OIL CONCENTRATE) 300 MG CAPS Take by mouth.  11/06/2014: Received from: [place]  . pantoprazole (PROTONIX) 40 MG tablet Take 1 tablet (40 mg total) by mouth daily.    No facility-administered encounter medications on file as of 10/13/2018.     Surgical History: Past Surgical History:  Procedure Laterality Date  . BREAST EXCISIONAL BIOPSY Right 1996   neg  . BREAST SURGERY     biopsy  . CHOLECYSTECTOMY    . COLONOSCOPY WITH PROPOFOL N/A 10/12/2014   Procedure: COLONOSCOPY WITH PROPOFOL;  Surgeon: Manya Silvas, MD;  Location: [facility] ENDOSCOPY;  Service: Endoscopy;  Laterality: N/A;  . DILATION AND CURETTAGE OF UTERUS    . ESOPHAGOGASTRODUODENOSCOPY (EGD) WITH PROPOFOL N/A 10/12/2014   Procedure: ESOPHAGOGASTRODUODENOSCOPY (EGD) WITH PROPOFOL;  Surgeon: Manya Silvas, MD;  Location: [facility] ENDOSCOPY;  Service: Endoscopy;  Laterality: N/A;  . NASAL SINUS SURGERY    . VAGINAL HYSTERECTOMY  1982    Medical History: Past Medical History:  Diagnosis Date  . Asthma   .  Chronic vaginitis   . Diabetes mellitus without complication ([place])   . Dyspareunia in female   . Eczema   . FH: breast cancer   . FH: colon cancer   . GERD (gastroesophageal reflux disease)   . Hiatus hernia syndrome   . Hyperlipidemia   . Hypertension   . Hypothyroidism   . Morbid obesity ([place])   . Postmenopausal   . Sleep apnea   . Superficial varicosities   . Vaginal atrophy     Family History: Family History  Problem Relation Age of Onset  . Osteoporosis Mother   . Breast cancer Maternal Aunt 60  . Diabetes Maternal Grandmother   . Diabetes Maternal Grandfather   . Heart disease Maternal Grandfather   . Colon cancer Cousin   . Breast cancer Cousin 16       mat cousin  . Ovarian cancer Neg Hx     Social History: Social History   Socioeconomic History  . Marital status: Married    Spouse name: Not on file  . Number of children: Not on file  . Years of education: Not on file  . Highest education level: Not on file  Occupational History  . Not on file  Social Needs  . Financial resource strain: Not on file  . Food insecurity    Worry: Not on file    Inability: Not on file  . Transportation needs    Medical: Not on file    Non-medical: Not on file  Tobacco Use  . Smoking status: Never Smoker  . Smokeless tobacco: Never Used  Substance and Sexual Activity  . Alcohol use: No    Alcohol/week: 0.0 standard drinks  . Drug use: No  . Sexual activity: Not Currently  Lifestyle  . Physical activity    Days per week: 2 days    Minutes per session: 30 min  . Stress: Not on file  Relationships  . Social Herbalist on phone: Not on file    Gets together: Not on file    Attends religious service: Not on file    Active member of club or organization: Not on file    Attends meetings of clubs or organizations: Not on file    Relationship status: Not on file  . Intimate partner violence    Fear of current or ex partner: Not on file    Emotionally  abused: Not on file    Physically abused: Not on file    Forced sexual activity: Not on file  Other Topics Concern  . Not on file  Social History Narrative  . Not on file    Vital Signs: Blood pressure 138/78, pulse 80, resp. rate 16, height 5\' 4"  (1.626 m), weight 239 lb (108.4 kg), SpO2 96 %.  Examination: General Appearance: The patient is well-developed, well-nourished, and in no distress. Skin: Gross inspection of skin unremarkable. Head: normocephalic, no gross deformities. Eyes: no gross deformities noted. ENT: ears appear grossly normal no  exudates. Neck: Supple. No thyromegaly. No LAD. Respiratory: clear bilaterally. Cardiovascular: Normal S1 and S2 without murmur or rub. Extremities: No cyanosis. pulses are equal. Neurologic: Alert and oriented. No involuntary movements.  LABS: Recent Results (from the past 2160 hour(s))  POCT urinalysis dipstick     Status: None   Collection Time: 08/30/18  2:18 PM  Result Value Ref Range   Color, UA yellow    Clarity, UA clear    Glucose, UA Negative Negative   Bilirubin, UA neg    Ketones, UA neg    Spec Grav, UA 1.010 1.010 - 1.025   Blood, UA neg    pH, UA 6.0 5.0 - 8.0   Protein, UA Negative Negative   Urobilinogen, UA 0.2 0.2 or 1.0 E.U./dL   Nitrite, UA neg    Leukocytes, UA Negative Negative   Appearance yellow    Odor    SAR CoV2 Serology (COVID 19)AB(IGG)IA     Status: None   Collection Time: 08/30/18  3:24 PM  Result Value Ref Range   [facility] SARS-CoV-2 Ab, IgG Negative Negative    Comment: This sample does not contain detectable SARS-CoV-2 IgG antibodies. This negative result does not rule out SARS-CoV-2 infection. Correlation with epidemiologic risk factors and other clinical and laboratory findings is recommended. Serologic results should not be used as the sole basis to diagnose or exclude recent SARS-CoV-2 infection. This assay was performed using the [facility] SARS-CoV-2 IgG assay.     Radiology: Dg  Chest 2 View  Result Date: 02/18/2018 CLINICAL DATA:  Intermittent productive cough for the past week with wheezing and rattling. EXAM: CHEST - 2 VIEW COMPARISON:  None. FINDINGS: Borderline enlarged cardiac silhouette. Clear lungs. Mild-to-moderate diffuse peribronchial thickening. Mild thoracic spine degenerative changes. IMPRESSION: Mild to moderate bronchitic changes. Electronically Signed   By: Claudie Revering M.D.   On: 02/18/2018 15:45    No results found.  No results found.    Assessment and Plan: Patient Active Problem List   Diagnosis Date Noted  . Leg pain 10/19/2017  . Nocturnal leg cramps 10/19/2017  . Chronic venous insufficiency 03/02/2017  . Lymphedema 03/02/2017  . Increased BMI 07/10/2015  . Pelvic pain in female 07/10/2015  . Vaginal atrophy 07/10/2015  . Status post vaginal hysterectomy 07/10/2015    1. Uncomplicated asthma, unspecified asthma severity, unspecified whether persistent Continue to use asthmanex as directed.  Use albuterol as directed.   2. Environmental allergies continue zyrtec, and Singulair.  Discussed use of Flonase with patient.   3. OSA on CPAP Continue to wear cpap as directed.    4. Gastroesophageal reflux disease without esophagitis Stable, continue present management.   5. Morbid obesity ([place]) Obesity Counseling: Risk Assessment: An assessment of behavioral risk factors was made today and includes lack of exercise sedentary lifestyle, lack of portion control and poor dietary habits.  Risk Modification Advice: She was counseled on portion control guidelines. Restricting daily caloric intake to. . The detrimental long term effects of obesity on her health and ongoing poor compliance was also discussed with the patient.  6. SOB (shortness of breath) FEV1 is 1.9 which is 89% or pre-predicted value.   - Spirometry with Graph  General Counseling: I have discussed the findings of the evaluation and examination with Mariann Laster.  I have also  discussed any further diagnostic evaluation thatmay be needed or ordered today. Virdie verbalizes understanding of the findings of todays visit. We also reviewed her medications today and discussed drug interactions and side effects  including but not limited excessive drowsiness and altered mental states. We also discussed that there is always a risk not just to her but also people around her. she has been encouraged to call the office with any questions or concerns that should arise related to todays visit.    Time spent:  15 This patient was seen by Orson Gear AGNP-C in Collaboration with Dr. Devona Konig as a part of collaborative care agreement.   I have personally obtained a history, examined the patient, evaluated laboratory and imaging results, formulated the assessment and plan and placed orders.    Allyne Gee, MD [facility] Pulmonary and Critical Care Sleep medicine

## 2018-10-19 ENCOUNTER — Ambulatory Visit: Payer: Self-pay | Admitting: Adult Health

## 2018-11-28 ENCOUNTER — Ambulatory Visit
Admission: RE | Admit: 2018-11-28 | Discharge: 2018-11-28 | Disposition: A | Discharge status: Home / Self Care | Payer: Medicare HMO | Source: Ambulatory Visit | Attending: Obstetrics and Gynecology | Admitting: Obstetrics and Gynecology

## 2018-11-28 ENCOUNTER — Other Ambulatory Visit: Payer: Self-pay

## 2018-11-28 DIAGNOSIS — Z1231 Encounter for screening mammogram for malignant neoplasm of breast: Secondary | ICD-10-CM | POA: Diagnosis not present

## 2018-11-28 DIAGNOSIS — Z1239 Encounter for other screening for malignant neoplasm of breast: Secondary | ICD-10-CM

## 2018-12-05 DIAGNOSIS — M1712 Unilateral primary osteoarthritis, left knee: Secondary | ICD-10-CM | POA: Insufficient documentation

## 2019-02-15 ENCOUNTER — Ambulatory Visit (INDEPENDENT_AMBULATORY_CARE_PROVIDER_SITE_OTHER): Payer: Medicare HMO | Admitting: Podiatry

## 2019-02-15 ENCOUNTER — Encounter: Payer: Self-pay | Admitting: Podiatry

## 2019-02-15 ENCOUNTER — Other Ambulatory Visit: Payer: Self-pay

## 2019-02-15 DIAGNOSIS — E119 Type 2 diabetes mellitus without complications: Secondary | ICD-10-CM | POA: Diagnosis not present

## 2019-02-15 DIAGNOSIS — M2040 Other hammer toe(s) (acquired), unspecified foot: Secondary | ICD-10-CM

## 2019-02-15 DIAGNOSIS — I872 Venous insufficiency (chronic) (peripheral): Secondary | ICD-10-CM

## 2019-02-15 NOTE — Progress Notes (Signed)
She presents today for a diabetic checkup.  She states that the tops of her feet hurt a little bit and the forefoot feels like she is walking on a pad.  But all in all she seems to be doing very well.  She has been developing some cramps in her calves and thighs.  She feels like she may have some restless leg syndrome.  Objective: Vital signs are stable alert and oriented x3 pulses are strong and palpable.  Capillary fill time is immediate neurologic sensorium is slightly diminished just to the plantar lateral side of the forefoot right.  No open lesions or wounds nails are normal she has some tenderness on palpation dorsal aspect of the foot overlying the TMT joints consistent with osteoarthritic changes.  Assessment: Neuritis diabetic neuropathy osteoarthritis and capsulitis.  Plan: I will follow-up with her in 6 months she will call sooner if needed we did discuss possible injection therapy dorsal aspect bilateral foot.

## 2019-02-17 ENCOUNTER — Other Ambulatory Visit: Payer: Self-pay | Admitting: Internal Medicine

## 2019-03-22 ENCOUNTER — Other Ambulatory Visit: Payer: Self-pay

## 2019-03-22 ENCOUNTER — Ambulatory Visit: Payer: Medicare HMO | Admitting: Orthotics

## 2019-03-22 DIAGNOSIS — E119 Type 2 diabetes mellitus without complications: Secondary | ICD-10-CM

## 2019-03-22 DIAGNOSIS — I872 Venous insufficiency (chronic) (peripheral): Secondary | ICD-10-CM

## 2019-03-22 DIAGNOSIS — M2040 Other hammer toe(s) (acquired), unspecified foot: Secondary | ICD-10-CM

## 2019-03-28 NOTE — Progress Notes (Signed)

## 2019-04-13 ENCOUNTER — Other Ambulatory Visit: Payer: Self-pay

## 2019-04-13 ENCOUNTER — Ambulatory Visit: Payer: Medicare HMO | Admitting: Podiatry

## 2019-04-13 DIAGNOSIS — M79674 Pain in right toe(s): Secondary | ICD-10-CM

## 2019-04-13 DIAGNOSIS — L03031 Cellulitis of right toe: Secondary | ICD-10-CM

## 2019-04-13 DIAGNOSIS — L600 Ingrowing nail: Secondary | ICD-10-CM | POA: Diagnosis not present

## 2019-04-13 MED ORDER — DOXYCYCLINE HYCLATE 100 MG PO TABS: 100.0000 mg | ORAL_TABLET | Freq: Two times a day (BID) | ORAL | 0 refills | Status: DC

## 2019-04-13 NOTE — Patient Instructions (Signed)

## 2019-04-14 ENCOUNTER — Encounter: Payer: Self-pay | Admitting: Podiatry

## 2019-04-14 NOTE — Progress Notes (Signed)
Subjective:  Patient ID: DAJA SCANLON, female    DOB: Jul 21, 1950,  MRN: [id number]  Chief Complaint  Patient presents with  . Foot Pain    pt is here for possible ingrown toenail of the right big toenail medial side, pt states that the pain has been going on for about a week, pt states that her right big toe, will often start to pus and swell, pain is a 6 out of 10 on the pain scale    69 y.o. female presents with the above complaint.  Patient presents with right medial hallux ingrown with paronychia.  Patient states that it has been going for about a week.  She started noticing redness around the toe and she was concerned that it might have been infected.  She has a history of diabetes with last A1c of around 7 per patient.  She states that she had pus in it and it was swollen.  It was painful to touch.  Her pain scale 6 out of 10 when applying pressure.  She denies any other acute complaints.  She has tried Epson salt soaking which has helped a little bit but has made it worse over time.  She denies any other acute complaints.   Review of Systems: Negative except as noted in the HPI. Denies N/V/F/Ch.  Past Medical History:  Diagnosis Date  . Asthma   . Chronic vaginitis   . Diabetes mellitus without complication ([place])   . Dyspareunia in female   . Eczema   . FH: breast cancer   . FH: colon cancer   . GERD (gastroesophageal reflux disease)   . Hiatus hernia syndrome   . Hyperlipidemia   . Hypertension   . Hypothyroidism   . Morbid obesity ([place])   . Postmenopausal   . Sleep apnea   . Superficial varicosities   . Vaginal atrophy     Current Outpatient Medications:  .  ACCU-CHEK AVIVA PLUS test strip, , Disp: , Rfl:  .  acetaminophen (TYLENOL) 500 MG tablet, Take 1,000 mg by mouth every 6 (six) hours as needed., Disp: , Rfl:  .  acetaminophen (TYLENOL) 650 MG CR tablet, Take 650 mg by mouth every 8 (eight) hours as needed for pain., Disp: , Rfl:  .  albuterol (PROAIR HFA) 108  (90 Base) MCG/ACT inhaler, Inhale 2 puffs into the lungs every 6 (six) hours as needed for wheezing or shortness of breath., Disp: 8 g, Rfl: 0 .  aspirin EC 81 MG tablet, Take 81 mg by mouth daily., Disp: , Rfl:  .  atorvastatin (LIPITOR) 10 MG tablet, , Disp: , Rfl:  .  Biotin 5000 MCG TABS, Take 1 tablet by mouth daily., Disp: , Rfl:  .  calcium carbonate (OS-CAL) 600 MG TABS tablet, Take 600 mg by mouth 2 (two) times daily with a meal., Disp: , Rfl:  .  cetirizine (ZYRTEC) 10 MG tablet, Take 10 mg by mouth daily., Disp: , Rfl:  .  diclofenac sodium (VOLTAREN) 1 % GEL, Apply 4 g topically 4 (four) times daily., Disp: 100 g, Rfl: 2 .  ferrous sulfate 325 (65 FE) MG tablet, Take 325 mg by mouth daily with breakfast., Disp: , Rfl:  .  glipiZIDE (GLUCOTROL XL) 5 MG 24 hr tablet, Take 5 mg by mouth daily with breakfast., Disp: , Rfl:  .  levothyroxine (SYNTHROID) 112 MCG tablet, , Disp: , Rfl:  .  losartan-hydrochlorothiazide (HYZAAR) 50-12.5 MG per tablet, , Disp: , Rfl:  .  magnesium oxide (MAG-OX) 400 MG tablet, Take 400 mg by mouth daily., Disp: , Rfl:  .  metFORMIN (GLUCOPHAGE) 1000 MG tablet, Take by mouth., Disp: , Rfl:  .  mometasone (ASMANEX 30 METERED DOSES) 220 MCG/INH inhaler, Inhale into the lungs., Disp: , Rfl:  .  montelukast (SINGULAIR) 10 MG tablet, TAKE 1 TABLET EVERY DAY, Disp: 90 tablet, Rfl: 1 .  NURTEC 75 MG TBDP, , Disp: , Rfl:  .  Omega-3 Fatty Acids (FISH OIL CONCENTRATE) 300 MG CAPS, Take by mouth. , Disp: , Rfl:  .  pantoprazole (PROTONIX) 40 MG tablet, TAKE 1 TABLET EVERY DAY, Disp: 90 tablet, Rfl: 1 .  doxycycline (VIBRA-TABS) 100 MG tablet, Take 1 tablet (100 mg total) by mouth 2 (two) times daily., Disp: 20 tablet, Rfl: 0  Social History   Tobacco Use  Smoking Status Never Smoker  Smokeless Tobacco Never Used    Allergies  Allergen Reactions  . Biaxin [Clarithromycin]   . Propulsid [Cisapride] Hives  . Sulfa Antibiotics Hives   Objective:  There were no  vitals filed for this visit. There is no height or weight on file to calculate BMI. Constitutional Well developed. Well nourished.  Vascular Dorsalis pedis pulses palpable bilaterally. Posterior tibial pulses palpable bilaterally. Capillary refill normal to all digits.  No cyanosis or clubbing noted. Pedal hair growth normal.  Neurologic Normal speech. Oriented to person, place, and time. Epicritic sensation to Petrosky touch grossly present bilaterally.  Dermatologic Painful ingrowing nail at medial nail borders of the hallux nail right. No other open wounds. No skin lesions.  Erythema noted surrounding the right hallux.  No purulent drainage expressed.  Orthopedic: Normal joint ROM without pain or crepitus bilaterally. No visible deformities. No bony tenderness.   Radiographs: None Assessment:  No diagnosis found. Plan:  Patient was evaluated and treated and all questions answered.  Ingrown Nail, right -Patient elects to proceed with minor surgery to remove ingrown toenail removal today. Consent reviewed and signed by patient. -Ingrown nail excised. See procedure note. -Educated on post-procedure care including soaking. Written instructions provided and reviewed. -Patient to follow up in 2 weeks for nail check. -Doxycycline was dispensed for 10 days for skin and soft tissue prophylaxis.  Procedure: Excision of Ingrown Toenail Location: Right 1st toe medial nail borders. Anesthesia: Lidocaine 1% plain; 1.5 mL and Marcaine 0.5% plain; 1.5 mL, digital block. Skin Prep: Betadine. Dressing: Silvadene; telfa; dry, sterile, compression dressing. Technique: Following skin prep, the toe was exsanguinated and a tourniquet was secured at the base of the toe. The affected nail border was freed, split with a nail splitter, and excised. Chemical matrixectomy was then performed with phenol and irrigated out with alcohol. The tourniquet was then removed and sterile dressing applied. Disposition:  Patient tolerated procedure well. Patient to return in 2 weeks for follow-up.   No follow-ups on file.

## 2019-04-21 ENCOUNTER — Telehealth: Payer: Self-pay

## 2019-04-21 NOTE — Telephone Encounter (Signed)
Called lmom informing patient of appointment on 04/25/2019. klh 

## 2019-04-25 ENCOUNTER — Other Ambulatory Visit: Payer: Self-pay

## 2019-04-25 ENCOUNTER — Ambulatory Visit: Payer: Medicare HMO | Admitting: Internal Medicine

## 2019-04-25 ENCOUNTER — Encounter: Payer: Self-pay | Admitting: Internal Medicine

## 2019-04-25 VITALS — BP 170/80 | HR 84 | Temp 97.7°F | Resp 16 | Ht 63.0 in | Wt 242.0 lb

## 2019-04-25 DIAGNOSIS — Z9109 Other allergy status, other than to drugs and biological substances: Secondary | ICD-10-CM | POA: Diagnosis not present

## 2019-04-25 DIAGNOSIS — J45909 Unspecified asthma, uncomplicated: Secondary | ICD-10-CM

## 2019-04-25 DIAGNOSIS — G4733 Obstructive sleep apnea (adult) (pediatric): Secondary | ICD-10-CM

## 2019-04-25 DIAGNOSIS — R0602 Shortness of breath: Secondary | ICD-10-CM

## 2019-04-25 DIAGNOSIS — Z9989 Dependence on other enabling machines and devices: Secondary | ICD-10-CM

## 2019-04-25 NOTE — Progress Notes (Signed)
[facility] [place], [place] [address]  Pulmonary Sleep Medicine   Office Visit Note  Patient Name: Danielle Delgado DOB: [birth year] MRN [id number]  Date of Service: 04/25/2019  Complaints/HPI: Patient is here today for routine pulmonary follow-up. She wears CPAP for OSA, reports nightly compliance. Cleaning her machine by hand, changing her filter and tubing as directed. Using Asmanex inhaler daily at night, using albuterol inhaler on an as needed basis for her asthma. She feels as though her asthma is well controlled on these inhalers at this time. Denies shortness of breath, cough or chest pain. Discussed allergy testing with her. She has been tested many years ago and has never followed up. She does take singulair and zyrtec daily for her allergies and feels as though this manages her symptoms well. She would like to take time to think about considering allergy testing at this time.  ROS  General: (-) fever, (-) chills, (-) night sweats, (-) weakness Skin: (-) rashes, (-) itching,. Eyes: (-) visual changes, (-) redness, (-) itching. Nose and Sinuses: (-) nasal stuffiness or itchiness, (-) postnasal drip, (-) nosebleeds, (-) sinus trouble. Mouth and Throat: (-) sore throat, (-) hoarseness. Neck: (-) swollen glands, (-) enlarged thyroid, (-) neck pain. Respiratory: - cough, (-) bloody sputum, - shortness of breath, - wheezing. Cardiovascular: - ankle swelling, (-) chest pain. Lymphatic: (-) lymph node enlargement. Neurologic: (-) numbness, (-) tingling. Psychiatric: (-) anxiety, (-) depression   Current Medication: Outpatient Encounter Medications as of 04/25/2019  Medication Sig Note  . ACCU-CHEK AVIVA PLUS test strip  01/17/2014: Received from: External Pharmacy  . acetaminophen (TYLENOL) 500 MG tablet Take 1,000 mg by mouth every 6 (six) hours as needed.   Marland Kitchen acetaminophen (TYLENOL) 650 MG CR tablet Take 650 mg by mouth every 8 (eight) hours as needed for  pain.   Marland Kitchen albuterol (PROAIR HFA) 108 (90 Base) MCG/ACT inhaler Inhale 2 puffs into the lungs every 6 (six) hours as needed for wheezing or shortness of breath.   Marland Kitchen aspirin EC 81 MG tablet Take 81 mg by mouth daily.   Marland Kitchen atorvastatin (LIPITOR) 10 MG tablet  01/17/2014: Received from: External Pharmacy  . Biotin 5000 MCG TABS Take 1 tablet by mouth daily.   . calcium carbonate (OS-CAL) 600 MG TABS tablet Take 600 mg by mouth 2 (two) times daily with a meal.   . cetirizine (ZYRTEC) 10 MG tablet Take 10 mg by mouth daily.   . diclofenac sodium (VOLTAREN) 1 % GEL Apply 4 g topically 4 (four) times daily.   Marland Kitchen doxycycline (VIBRA-TABS) 100 MG tablet Take 1 tablet (100 mg total) by mouth 2 (two) times daily.   . ferrous sulfate 325 (65 FE) MG tablet Take 325 mg by mouth daily with breakfast.   . glipiZIDE (GLUCOTROL XL) 5 MG 24 hr tablet Take 5 mg by mouth daily with breakfast.   . levothyroxine (SYNTHROID) 112 MCG tablet    . losartan-hydrochlorothiazide (HYZAAR) 50-12.5 MG per tablet  01/17/2014: Received from: External Pharmacy  . magnesium oxide (MAG-OX) 400 MG tablet Take 400 mg by mouth daily.   . metFORMIN (GLUCOPHAGE) 1000 MG tablet Take by mouth. 11/06/2014: Received from: [facility]  . mometasone (ASMANEX 30 METERED DOSES) 220 MCG/INH inhaler Inhale into the lungs.   . montelukast (SINGULAIR) 10 MG tablet TAKE 1 TABLET EVERY DAY   . NURTEC 75 MG TBDP    . Omega-3 Fatty Acids (FISH OIL CONCENTRATE) 300 MG CAPS Take  by mouth.  11/06/2014: Received from: [facility]  . pantoprazole (PROTONIX) 40 MG tablet TAKE 1 TABLET EVERY DAY    No facility-administered encounter medications on file as of 04/25/2019.    Surgical History: Past Surgical History:  Procedure Laterality Date  . BREAST EXCISIONAL BIOPSY Right 1996   neg  . BREAST SURGERY     biopsy  . CHOLECYSTECTOMY    . COLONOSCOPY WITH PROPOFOL N/A 10/12/2014   Procedure: COLONOSCOPY WITH PROPOFOL;   Surgeon: Manya Silvas, MD;  Location: [facility] ENDOSCOPY;  Service: Endoscopy;  Laterality: N/A;  . DILATION AND CURETTAGE OF UTERUS    . ESOPHAGOGASTRODUODENOSCOPY (EGD) WITH PROPOFOL N/A 10/12/2014   Procedure: ESOPHAGOGASTRODUODENOSCOPY (EGD) WITH PROPOFOL;  Surgeon: Manya Silvas, MD;  Location: [facility] ENDOSCOPY;  Service: Endoscopy;  Laterality: N/A;  . NASAL SINUS SURGERY    . VAGINAL HYSTERECTOMY  1982    Medical History: Past Medical History:  Diagnosis Date  . Asthma   . Chronic vaginitis   . Diabetes mellitus without complication ([facility])   . Dyspareunia in female   . Eczema   . FH: breast cancer   . FH: colon cancer   . GERD (gastroesophageal reflux disease)   . Hiatus hernia syndrome   . Hyperlipidemia   . Hypertension   . Hypothyroidism   . Morbid obesity ([place])   . Postmenopausal   . Sleep apnea   . Superficial varicosities   . Vaginal atrophy     Family History: Family History  Problem Relation Age of Onset  . Osteoporosis Mother   . Breast cancer Maternal Aunt 60  . Diabetes Maternal Grandmother   . Diabetes Maternal Grandfather   . Heart disease Maternal Grandfather   . Colon cancer Cousin   . Breast cancer Cousin 61       mat cousin  . Ovarian cancer Neg Hx     Social History: Social History   Socioeconomic History  . Marital status: Married    Spouse name: Not on file  . Number of children: Not on file  . Years of education: Not on file  . Highest education level: Not on file  Occupational History  . Not on file  Tobacco Use  . Smoking status: Never Smoker  . Smokeless tobacco: Never Used  Substance and Sexual Activity  . Alcohol use: No    Alcohol/week: 0.0 standard drinks  . Drug use: No  . Sexual activity: Not Currently  Other Topics Concern  . Not on file  Social History Narrative  . Not on file   Social Determinants of Health   Financial Resource Strain:   . Difficulty of Paying Living Expenses: Not on file  Food Insecurity:    . Worried About Charity fundraiser in the Last Year: Not on file  . Ran Out of Food in the Last Year: Not on file  Transportation Needs:   . Lack of Transportation (Medical): Not on file  . Lack of Transportation (Non-Medical): Not on file  Physical Activity:   . Days of Exercise per Week: Not on file  . Minutes of Exercise per Session: Not on file  Stress:   . Feeling of Stress : Not on file  Social Connections:   . Frequency of Communication with Friends and Family: Not on file  . Frequency of Social Gatherings with Friends and Family: Not on file  . Attends Religious Services: Not on file  . Active Member of Clubs or Organizations: Not  on file  . Attends Archivist Meetings: Not on file  . Marital Status: Not on file  Intimate Partner Violence:   . Fear of Current or Ex-Partner: Not on file  . Emotionally Abused: Not on file  . Physically Abused: Not on file  . Sexually Abused: Not on file    Vital Signs: Blood pressure (!) 170/80, pulse 84, temperature 97.7 F (36.5 C), resp. rate 16, height 5\' 3"  (1.6 m), weight 242 lb (109.8 kg), SpO2 97 %.  Examination: General Appearance: The patient is well-developed, well-nourished, and in no distress. Skin: Gross inspection of skin unremarkable. Head: normocephalic, no gross deformities. Eyes: no gross deformities noted. ENT: ears appear grossly normal no exudates. Neck: Supple. No thyromegaly. No LAD. Respiratory: Clear lung sounds bilaterally. Cardiovascular: Normal S1 and S2 without murmur or rub. Extremities: No cyanosis. pulses are equal. Neurologic: Alert and oriented. No involuntary movements.  LABS: No results found for this or any previous visit (from the past 2160 hour(s)).  Radiology: MM 3D SCREEN BREAST BILATERAL  Result Date: 11/29/2018 CLINICAL DATA:  Screening. EXAM: DIGITAL SCREENING BILATERAL MAMMOGRAM WITH TOMO AND CAD COMPARISON:  Previous exam(s). ACR Breast Density Category b: There are  scattered areas of fibroglandular density. FINDINGS: There are no findings suspicious for malignancy. Images were processed with CAD. IMPRESSION: No mammographic evidence of malignancy. A result letter of this screening mammogram will be mailed directly to the patient. RECOMMENDATION: Screening mammogram in one year. (Code:SM-B-01Y) BI-RADS CATEGORY  1: Negative. Electronically Signed   By: Lajean Manes M.D.   On: 11/29/2018 10:53    No results found.  No results found.    Assessment and Plan: Patient Active Problem List   Diagnosis Date Noted  . Leg pain 10/19/2017  . Nocturnal leg cramps 10/19/2017  . Chronic venous insufficiency 03/02/2017  . Lymphedema 03/02/2017  . Increased BMI 07/10/2015  . Pelvic pain in female 07/10/2015  . Vaginal atrophy 07/10/2015  . Status post vaginal hysterectomy 07/10/2015    1. OSA on CPAP Reports nightly compliance with CPAP, no complications at this time, continue to monitor.  2. Environmental allergies Stable at this time on current therapy, would like time to think about allergy testing, continue to monitor.  3. Uncomplicated asthma, unspecified asthma severity, unspecified whether persistent Stable at this time on current therapy, continue to monitor.  4. SOB (shortness of breath) - Spirometry with Graph  General Counseling: I have discussed the findings of the evaluation and examination with Danielle Delgado.  I have also discussed any further diagnostic evaluation thatmay be needed or ordered today. Danielle Delgado verbalizes understanding of the findings of todays visit. We also reviewed her medications today and discussed drug interactions and side effects including but not limited excessive drowsiness and altered mental states. We also discussed that there is always a risk not just to her but also people around her. she has been encouraged to call the office with any questions or concerns that should arise related to todays visit.  Orders Placed This  Encounter  Procedures  . Spirometry with Graph    Order Specific Question:   Where should this test be performed?    Answer:   [place]     Time spent: 25 This patient was seen by Orson Gear AGNP-C in Collaboration with Dr. Devona Konig as a part of collaborative care agreement.  I have personally obtained a history, examined the patient, evaluated laboratory and imaging results, formulated the assessment and plan and placed  orders.    Allyne Gee, MD [facility] Pulmonary and Critical Care Sleep medicine

## 2019-04-26 DIAGNOSIS — G43719 Chronic migraine without aura, intractable, without status migrainosus: Secondary | ICD-10-CM | POA: Insufficient documentation

## 2019-04-27 ENCOUNTER — Other Ambulatory Visit: Payer: Self-pay

## 2019-04-27 ENCOUNTER — Ambulatory Visit: Payer: Medicare HMO | Admitting: Podiatry

## 2019-04-27 DIAGNOSIS — L03031 Cellulitis of right toe: Secondary | ICD-10-CM

## 2019-04-27 DIAGNOSIS — M79674 Pain in right toe(s): Secondary | ICD-10-CM

## 2019-04-27 DIAGNOSIS — L600 Ingrowing nail: Secondary | ICD-10-CM

## 2019-04-28 ENCOUNTER — Encounter: Payer: Self-pay | Admitting: Podiatry

## 2019-04-28 NOTE — Progress Notes (Addendum)
Subjective: Danielle Delgado is a 69 y.o.  female returns to office today for follow up evaluation after having right Hallux Medial border nail avulsion performed. Patient has been soaking using epsom salt and applying topical antibiotic covered with bandaid daily. Patient denies fevers, chills, nausea, vomiting. Denies any calf pain, chest pain, SOB.   Objective:  Vitals: Reviewed  General: Well developed, nourished, in no acute distress, alert and oriented x3   Dermatology: Skin is warm, dry and supple bilateral. Medial hallux nail border appears to be clean, dry, with mild granular tissue and surrounding scab. There is no surrounding erythema, edema, drainage/purulence. The remaining nails appear unremarkable at this time. There are no other lesions or other signs of infection present.  Neurovascular status: Intact. No lower extremity swelling; No pain with calf compression bilateral.  Musculoskeletal: Decreased tenderness to palpation of the Medial hallux nail fold(s). Muscular strength within normal limits bilateral.   Assesement and Plan: S/p partial nail avulsion, doing well.   -Continue soaking in epsom salts twice a day followed by antibiotic ointment and a band-aid. Can leave uncovered at night. Continue this until completely healed.  -If the area has not healed in 2 weeks, call the office for follow-up appointment, or sooner if any problems arise.  -Monitor for any signs/symptoms of infection. Call the office immediately if any occur or go directly to the emergency room. Call with any questions/concerns.  Toe contractures bilaterally  -Given that patient has toe contracture in setting of diabetes patient is prone to developing ulcerations especially with some component of neuropathy involved.  Given these findings I believe patient will benefit from diabetic shoes as they are medically necessary to help distribute the pressure evenly and therefore prevent ulcerations.  Boneta Lucks,  DPM

## 2019-05-04 ENCOUNTER — Telehealth: Payer: Self-pay | Admitting: *Deleted

## 2019-05-04 NOTE — Telephone Encounter (Signed)
Faxed addended chart note to Crystal at Dr. Guerry Bruin office - [phone].

## 2019-05-04 NOTE — Telephone Encounter (Signed)
[facility]-Dr. Gaye Pollack - [facility] request 04/27/2019 clinicals  To state the diabetic shoes are medically necessary, faxed [phone].

## 2019-05-04 NOTE — Telephone Encounter (Signed)
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  Thanks let me know if you need anything else

## 2019-05-18 ENCOUNTER — Other Ambulatory Visit: Payer: Self-pay

## 2019-05-18 ENCOUNTER — Ambulatory Visit: Payer: Medicare HMO | Admitting: Podiatry

## 2019-05-18 ENCOUNTER — Ambulatory Visit (INDEPENDENT_AMBULATORY_CARE_PROVIDER_SITE_OTHER): Payer: Medicare HMO

## 2019-05-18 ENCOUNTER — Other Ambulatory Visit: Payer: Self-pay | Admitting: Podiatry

## 2019-05-18 DIAGNOSIS — I8311 Varicose veins of right lower extremity with inflammation: Secondary | ICD-10-CM

## 2019-05-18 DIAGNOSIS — L03031 Cellulitis of right toe: Secondary | ICD-10-CM

## 2019-05-18 DIAGNOSIS — L600 Ingrowing nail: Secondary | ICD-10-CM | POA: Diagnosis not present

## 2019-05-18 DIAGNOSIS — I83893 Varicose veins of bilateral lower extremities with other complications: Secondary | ICD-10-CM

## 2019-05-18 DIAGNOSIS — I8312 Varicose veins of left lower extremity with inflammation: Secondary | ICD-10-CM

## 2019-05-18 MED ORDER — DOXYCYCLINE HYCLATE 100 MG PO TABS: 100.0000 mg | ORAL_TABLET | Freq: Two times a day (BID) | ORAL | 0 refills | Status: DC

## 2019-05-19 ENCOUNTER — Encounter: Payer: Self-pay | Admitting: Podiatry

## 2019-05-19 NOTE — Progress Notes (Signed)
Subjective:  Patient ID: Danielle Delgado, female    DOB: 06-15-1950,  MRN: [id number]  Chief Complaint  Patient presents with  . Foot Pain    pt is here for left foot pain, pt states that she is feeling pain on the entire left foor, as well as the anterior view of the left foot, pt is concerned that she might has cellulitis.    69 y.o. female presents with the above complaint.  Patient presents with complaints of bilateral erythema surrounding the legs.  Patient states that she has had varicosities for very long time.  She states the redness around the varicosities appeared about 4 to 5 days have progressively gotten worse.  Patient states the pain is gradual.  Pain scale is 2 out of 10.  She was seen last by me on ingrown that I done on the other side which she is doing really well.  She does not have any pain with it she has healed really well.  She would like to know if there is redness has to do with any infection associated with it.  She denies any other acute complaints.  She is being followed by vascular vein in clinic.   Review of Systems: Negative except as noted in the HPI. Denies N/V/F/Ch.  Past Medical History:  Diagnosis Date  . Asthma   . Chronic vaginitis   . Diabetes mellitus without complication ([place])   . Dyspareunia in female   . Eczema   . FH: breast cancer   . FH: colon cancer   . GERD (gastroesophageal reflux disease)   . Hiatus hernia syndrome   . Hyperlipidemia   . Hypertension   . Hypothyroidism   . Morbid obesity ([place])   . Postmenopausal   . Sleep apnea   . Superficial varicosities   . Vaginal atrophy     Current Outpatient Medications:  .  ACCU-CHEK AVIVA PLUS test strip, , Disp: , Rfl:  .  acetaminophen (TYLENOL) 500 MG tablet, Take 1,000 mg by mouth every 6 (six) hours as needed., Disp: , Rfl:  .  acetaminophen (TYLENOL) 650 MG CR tablet, Take 650 mg by mouth every 8 (eight) hours as needed for pain., Disp: , Rfl:  .  albuterol (PROAIR HFA) 108 (90  Base) MCG/ACT inhaler, Inhale 2 puffs into the lungs every 6 (six) hours as needed for wheezing or shortness of breath., Disp: 8 g, Rfl: 0 .  aspirin EC 81 MG tablet, Take 81 mg by mouth daily., Disp: , Rfl:  .  atorvastatin (LIPITOR) 10 MG tablet, , Disp: , Rfl:  .  Biotin 5000 MCG TABS, Take 1 tablet by mouth daily., Disp: , Rfl:  .  calcium carbonate (OS-CAL) 600 MG TABS tablet, Take 600 mg by mouth 2 (two) times daily with a meal., Disp: , Rfl:  .  cetirizine (ZYRTEC) 10 MG tablet, Take 10 mg by mouth daily., Disp: , Rfl:  .  diclofenac sodium (VOLTAREN) 1 % GEL, Apply 4 g topically 4 (four) times daily., Disp: 100 g, Rfl: 2 .  doxycycline (VIBRA-TABS) 100 MG tablet, Take 1 tablet (100 mg total) by mouth 2 (two) times daily., Disp: 20 tablet, Rfl: 0 .  ferrous sulfate 325 (65 FE) MG tablet, Take 325 mg by mouth daily with breakfast., Disp: , Rfl:  .  glipiZIDE (GLUCOTROL XL) 5 MG 24 hr tablet, Take 5 mg by mouth daily with breakfast., Disp: , Rfl:  .  levothyroxine (SYNTHROID) 112 MCG tablet, , Disp: ,  Rfl:  .  losartan-hydrochlorothiazide (HYZAAR) 50-12.5 MG per tablet, , Disp: , Rfl:  .  magnesium oxide (MAG-OX) 400 MG tablet, Take 400 mg by mouth daily., Disp: , Rfl:  .  Melatonin 1 MG TABS, Take by mouth., Disp: , Rfl:  .  metFORMIN (GLUCOPHAGE) 1000 MG tablet, Take by mouth., Disp: , Rfl:  .  mometasone (ASMANEX 30 METERED DOSES) 220 MCG/INH inhaler, Inhale into the lungs., Disp: , Rfl:  .  montelukast (SINGULAIR) 10 MG tablet, TAKE 1 TABLET EVERY DAY, Disp: 90 tablet, Rfl: 1 .  NURTEC 75 MG TBDP, , Disp: , Rfl:  .  Omega-3 Fatty Acids (FISH OIL CONCENTRATE) 300 MG CAPS, Take by mouth. , Disp: , Rfl:  .  pantoprazole (PROTONIX) 40 MG tablet, TAKE 1 TABLET EVERY DAY, Disp: 90 tablet, Rfl: 1 .  doxycycline (VIBRA-TABS) 100 MG tablet, Take 1 tablet (100 mg total) by mouth 2 (two) times daily., Disp: 20 tablet, Rfl: 0  Social History   Tobacco Use  Smoking Status Never Smoker   Smokeless Tobacco Never Used    Allergies  Allergen Reactions  . Biaxin [Clarithromycin]   . Propulsid [Cisapride] Hives  . Sulfa Antibiotics Hives   Objective:  There were no vitals filed for this visit. There is no height or weight on file to calculate BMI. Constitutional Well developed. Well nourished.  Vascular Dorsalis pedis pulses palpable bilaterally. Posterior tibial pulses palpable bilaterally. Capillary refill normal to all digits.  No cyanosis or clubbing noted. Pedal hair growth normal.  Neurologic Normal speech. Oriented to person, place, and time. Epicritic sensation to Cea touch grossly present bilaterally.  Dermatologic  bilateral lower extremity varicosities with surrounding inflammation/erythema.  No crepitus or fluctuance noted bilaterally.  3+ pitting edema noted bilaterally.  Orthopedic: Normal joint ROM without pain or crepitus bilaterally. No visible deformities. No bony tenderness.   Radiographs: 3 views of skeletally mature adult right foot No bony abnormalities identified.  Mild arthritic changes in the midfoot noted.  Ankle joint and mortise within normal limits.  Mild arthritic changes of the first metatarsal phalangeal joint noted. Assessment:   1. Paronychia of toe of right foot due to ingrown toenail   2. Varicose veins of both lower extremities with inflammation    Plan:  Patient was evaluated and treated and all questions answered.  Bilateral lower extremity phlebitis secondary to varicose veins noted -I explained to the patient the etiology of phlebitis and with this relationship with the varicose veins.  I believe the redness surrounding the veins in the lower legs is associated with inflamed veins.  I discussed with the patient that aggressive elevation followed by compression socks are the best course of therapy to help decrease some of the inflammatory component of this.  Patient states understanding and will aggressively elevate and get  the compression socks that she needs. -I have prophylactically placed her on doxycycline for skin and soft tissue prophylaxis. -I also believe patient will benefit from revisiting vein and vascular clinic to help address the varicosities of the veins.  Patient states understanding and will do so.  No follow-ups on file.

## 2019-05-25 ENCOUNTER — Other Ambulatory Visit (INDEPENDENT_AMBULATORY_CARE_PROVIDER_SITE_OTHER): Payer: Self-pay | Admitting: Nurse Practitioner

## 2019-05-25 ENCOUNTER — Telehealth (INDEPENDENT_AMBULATORY_CARE_PROVIDER_SITE_OTHER): Payer: Self-pay | Admitting: Vascular Surgery

## 2019-05-25 DIAGNOSIS — I8003 Phlebitis and thrombophlebitis of superficial vessels of lower extremities, bilateral: Secondary | ICD-10-CM

## 2019-05-25 NOTE — Telephone Encounter (Signed)
Looking at the PCPs note it looks like she has a superficial phlebitis, the good thing is that this is not the dangerous sort but it is painful.  We can get her in with a bilateral reflux study per her convenience to see Schnier or Me (per pt preference)

## 2019-05-25 NOTE — Telephone Encounter (Signed)
Please schedule pt to be seen per NP note below.

## 2019-05-25 NOTE — Telephone Encounter (Signed)
Please advise 

## 2019-05-29 ENCOUNTER — Other Ambulatory Visit: Payer: Self-pay

## 2019-05-29 ENCOUNTER — Encounter (INDEPENDENT_AMBULATORY_CARE_PROVIDER_SITE_OTHER): Payer: Self-pay | Admitting: Nurse Practitioner

## 2019-05-29 ENCOUNTER — Ambulatory Visit (INDEPENDENT_AMBULATORY_CARE_PROVIDER_SITE_OTHER): Payer: Medicare HMO

## 2019-05-29 ENCOUNTER — Ambulatory Visit (INDEPENDENT_AMBULATORY_CARE_PROVIDER_SITE_OTHER): Payer: Medicare HMO | Admitting: Nurse Practitioner

## 2019-05-29 VITALS — BP 159/76 | HR 78 | Ht 64.0 in | Wt 249.0 lb

## 2019-05-29 DIAGNOSIS — M1712 Unilateral primary osteoarthritis, left knee: Secondary | ICD-10-CM

## 2019-05-29 DIAGNOSIS — I8003 Phlebitis and thrombophlebitis of superficial vessels of lower extremities, bilateral: Secondary | ICD-10-CM | POA: Diagnosis not present

## 2019-05-29 DIAGNOSIS — I890 Lymphedema, not elsewhere classified: Secondary | ICD-10-CM | POA: Diagnosis not present

## 2019-05-29 DIAGNOSIS — I8312 Varicose veins of left lower extremity with inflammation: Secondary | ICD-10-CM | POA: Diagnosis not present

## 2019-05-29 DIAGNOSIS — I8311 Varicose veins of right lower extremity with inflammation: Secondary | ICD-10-CM

## 2019-05-30 ENCOUNTER — Encounter (INDEPENDENT_AMBULATORY_CARE_PROVIDER_SITE_OTHER): Payer: Self-pay | Admitting: Nurse Practitioner

## 2019-05-30 NOTE — Progress Notes (Signed)
Subjective:    Patient ID: Danielle Delgado, female    DOB: 03-15-1950, 69 y.o.   MRN: [id number] Chief Complaint  Patient presents with  . Follow-up    BIL venous Reflux     Today the patient presents for concern of redness of her left lower extremity in addition to swelling.  The patient states that her primary care doctor was concerned that she may have a blood clot in her left lower extremity and she requested to be seen for follow-up studies.  We have previously seen the patient in our office in 01/2018 and discussed conservative therapy tactics.  The patient was originally scheduled to follow-up however COVID-19 caused some issues with follow-up scheduling.  The patient states that around her birthday some time she noticed that she was having some worsening swelling in her leg as well as some redness.  She states that the redness was always worse at the end of the day and better in the morning after she woke up.  She states that when she saw her podiatrist she was given doxycycline on the concern that it may be cellulitis.  The patient has had cellulitis before so this was a large concern.  The patient does admit that she has not been wearing medical grade 1 compression stockings due to the fact that they rolled down and cause compression and pain in her calf.  However, previous time for the patient has utilize some compression, such as in the case of an ankle brace, she did find that the compression was beneficial.  The patient has some discomfort in her left lower extremity.  It is described as a burning stinging sensation.  The patient has known arthritis in her left knee as well as some in her foot.  The patient also does have some significant varicosities in this area.  The patient states that many years ago she had a procedure done to her veins.  Based on her description as well as the noninvasive studies to done today, this was likely some sort of sclerotherapy that was done.  Today  noninvasive studies show that the patient has reflux in the bilateral common femoral veins.  The right lower extremity has reflux in the great saphenous vein at the saphenofemoral junction as well as in the distal thigh extending to the knee.  The vein diameters measure from 0.37 to 0.60 cm.  There is also reflux present in the right small saphenous vein with a vein diameter of 0.28 cm.  The left lower extremity has reflux in the great saphenous vein at the saphenofemoral junction extending to the mid thigh.  The vein diameters measure from 0.33 cm to 0.78 cm.  There is no evidence of DVT or superficial venous thrombosis seen bilaterally.   Review of Systems  Cardiovascular: Positive for leg swelling.  Musculoskeletal: Positive for arthralgias, joint swelling and myalgias.  All other systems reviewed and are negative.      Objective:   Physical Exam Vitals reviewed.  Constitutional:      Appearance: Normal appearance. She is obese.  HENT:     Head: Normocephalic.  Eyes:     Extraocular Movements: Extraocular movements intact.  Cardiovascular:     Rate and Rhythm: Normal rate and regular rhythm.     Pulses: Normal pulses.  Pulmonary:     Effort: Pulmonary effort is normal.  Musculoskeletal:     Right lower leg: Edema present.     Left lower leg: Edema present.  Skin:    General: Skin is warm.     Capillary Refill: Capillary refill takes less than 2 seconds.     Comments: Scattered spider varicosities bilaterally  Neurological:     General: No focal deficit present.     Mental Status: She is alert.  Psychiatric:        Mood and Affect: Mood normal.        Behavior: Behavior normal.        Thought Content: Thought content normal.        Judgment: Judgment normal.     BP (!) 159/76   Pulse 78   Ht 5\' 4"  (1.626 m)   Wt 249 lb (112.9 kg)   BMI 42.74 kg/m   Past Medical History:  Diagnosis Date  . Asthma   . Chronic vaginitis   . Diabetes mellitus without complication  ([place])   . Dyspareunia in female   . Eczema   . FH: breast cancer   . FH: colon cancer   . GERD (gastroesophageal reflux disease)   . Hiatus hernia syndrome   . Hyperlipidemia   . Hypertension   . Hypothyroidism   . Morbid obesity ([place])   . Postmenopausal   . Sleep apnea   . Superficial varicosities   . Vaginal atrophy     Social History   Socioeconomic History  . Marital status: Married    Spouse name: Not on file  . Number of children: Not on file  . Years of education: Not on file  . Highest education level: Not on file  Occupational History  . Not on file  Tobacco Use  . Smoking status: Never Smoker  . Smokeless tobacco: Never Used  Substance and Sexual Activity  . Alcohol use: No    Alcohol/week: 0.0 standard drinks  . Drug use: No  . Sexual activity: Not Currently  Other Topics Concern  . Not on file  Social History Narrative  . Not on file   Social Determinants of Health   Financial Resource Strain:   . Difficulty of Paying Living Expenses:   Food Insecurity:   . Worried About Charity fundraiser in the Last Year:   . Arboriculturist in the Last Year:   Transportation Needs:   . Film/video editor (Medical):   Marland Kitchen Lack of Transportation (Non-Medical):   Physical Activity:   . Days of Exercise per Week:   . Minutes of Exercise per Session:   Stress:   . Feeling of Stress :   Social Connections:   . Frequency of Communication with Friends and Family:   . Frequency of Social Gatherings with Friends and Family:   . Attends Religious Services:   . Active Member of Clubs or Organizations:   . Attends Archivist Meetings:   Marland Kitchen Marital Status:   Intimate Partner Violence:   . Fear of Current or Ex-Partner:   . Emotionally Abused:   Marland Kitchen Physically Abused:   . Sexually Abused:     Past Surgical History:  Procedure Laterality Date  . BREAST EXCISIONAL BIOPSY Right 1996   neg  . BREAST SURGERY     biopsy  . CHOLECYSTECTOMY    .  COLONOSCOPY WITH PROPOFOL N/A 10/12/2014   Procedure: COLONOSCOPY WITH PROPOFOL;  Surgeon: Manya Silvas, MD;  Location: [facility] ENDOSCOPY;  Service: Endoscopy;  Laterality: N/A;  . DILATION AND CURETTAGE OF UTERUS    . ESOPHAGOGASTRODUODENOSCOPY (EGD) WITH PROPOFOL N/A 10/12/2014   Procedure:  ESOPHAGOGASTRODUODENOSCOPY (EGD) WITH PROPOFOL;  Surgeon: Manya Silvas, MD;  Location: [facility] ENDOSCOPY;  Service: Endoscopy;  Laterality: N/A;  . NASAL SINUS SURGERY    . VAGINAL HYSTERECTOMY  1982    Family History  Problem Relation Age of Onset  . Osteoporosis Mother   . Breast cancer Maternal Aunt 60  . Diabetes Maternal Grandmother   . Diabetes Maternal Grandfather   . Heart disease Maternal Grandfather   . Colon cancer Cousin   . Breast cancer Cousin 53       mat cousin  . Ovarian cancer Neg Hx     Allergies  Allergen Reactions  . Biaxin [Clarithromycin]   . Propulsid [Cisapride] Hives  . Sulfa Antibiotics Hives       Assessment & Plan:   1. Varicose veins of both lower extremities with inflammation Had a long discussion with the patient regarding endovenous ablation for treatment of the varicose veins in addition to conservative therapy.  The patient minimally has not worn medical grade compression stockings on a regular basis however at times when she is worn some sort of compression it has been helpful for her.  Her biggest issue at this time however is the fact that her current compression stockings do not fit well and the end up rolling down her leg.  Today the patient was given a prescription for compression stockings as well as a list of several places that carry compression stockings, as well as fit compression stockings.  Prior to moving forward with an endovenous ablation, the patient will try to obtain compression stockings to see how this helps with not only her varicose veins but her swelling as well.  We will have the patient back in 3 months to reevaluate her progression  with conservative therapy.  The patient is also advised to elevate her lower extremities as well as to exercise when possible  2. Lymphedema As mentioned previously the patient has not worn medical grade 1 compression stockings regularly.  We had a discussion about the different skin changes that accompany chronic venous insufficiency as well as lymphedema.  We discussed the inflammation that is caused by extended swelling.  As discussed above the patient will attempt to utilize medical grade 1 compression stockings in addition to exercise and elevation.  If despite conservative therapies the patient still has significant swelling and discomfort a lymphedema pump may also be useful for her as well.  3. Primary osteoarthritis of left knee This may also be contributing to her lower extremity discomfort.  Continue NSAID medications as already ordered, these medications have been reviewed and there are no changes at this time.  Continued activity and therapy was stressed.    Current Outpatient Medications on File Prior to Visit  Medication Sig Dispense Refill  . ACCU-CHEK AVIVA PLUS test strip     . acetaminophen (TYLENOL) 500 MG tablet Take 1,000 mg by mouth every 6 (six) hours as needed.    Marland Kitchen acetaminophen (TYLENOL) 650 MG CR tablet Take 650 mg by mouth every 8 (eight) hours as needed for pain.    Marland Kitchen albuterol (PROAIR HFA) 108 (90 Base) MCG/ACT inhaler Inhale 2 puffs into the lungs every 6 (six) hours as needed for wheezing or shortness of breath. 8 g 0  . aspirin EC 81 MG tablet Take 81 mg by mouth daily.    Marland Kitchen atorvastatin (LIPITOR) 10 MG tablet     . Biotin 5000 MCG TABS Take 1 tablet by mouth daily.    Marland Kitchen  cetirizine (ZYRTEC) 10 MG tablet Take 10 mg by mouth daily.    . diclofenac sodium (VOLTAREN) 1 % GEL Apply 4 g topically 4 (four) times daily. 100 g 2  . ferrous sulfate 325 (65 FE) MG tablet Take 325 mg by mouth daily with breakfast.    . glipiZIDE (GLUCOTROL XL) 5 MG 24 hr tablet Take 5 mg  by mouth daily with breakfast.    . levothyroxine (SYNTHROID) 112 MCG tablet     . losartan-hydrochlorothiazide (HYZAAR) 50-12.5 MG per tablet     . magnesium oxide (MAG-OX) 400 MG tablet Take 400 mg by mouth daily.    . Melatonin 1 MG TABS Take by mouth.    . metFORMIN (GLUCOPHAGE) 1000 MG tablet Take by mouth.    . mometasone (ASMANEX 30 METERED DOSES) 220 MCG/INH inhaler Inhale into the lungs.    . montelukast (SINGULAIR) 10 MG tablet TAKE 1 TABLET EVERY DAY 90 tablet 1  . Omega-3 Fatty Acids (FISH OIL CONCENTRATE) 300 MG CAPS Take by mouth.     . pantoprazole (PROTONIX) 40 MG tablet TAKE 1 TABLET EVERY DAY 90 tablet 1  . calcium carbonate (OS-CAL) 600 MG TABS tablet Take 600 mg by mouth 2 (two) times daily with a meal.    . doxycycline (VIBRA-TABS) 100 MG tablet Take 1 tablet (100 mg total) by mouth 2 (two) times daily. (Patient not taking: Reported on 05/29/2019) 20 tablet 0  . doxycycline (VIBRA-TABS) 100 MG tablet Take 1 tablet (100 mg total) by mouth 2 (two) times daily. (Patient not taking: Reported on 05/29/2019) 20 tablet 0  . NURTEC 75 MG TBDP      No current facility-administered medications on file prior to visit.    There are no Patient Instructions on file for this visit. No follow-ups on file.   Kris Hartmann, NP

## 2019-06-21 ENCOUNTER — Ambulatory Visit: Payer: Medicare HMO | Admitting: Orthotics

## 2019-06-21 ENCOUNTER — Other Ambulatory Visit: Payer: Self-pay

## 2019-06-21 DIAGNOSIS — E1159 Type 2 diabetes mellitus with other circulatory complications: Secondary | ICD-10-CM | POA: Diagnosis not present

## 2019-06-21 DIAGNOSIS — M2041 Other hammer toe(s) (acquired), right foot: Secondary | ICD-10-CM

## 2019-06-21 DIAGNOSIS — E119 Type 2 diabetes mellitus without complications: Secondary | ICD-10-CM

## 2019-06-21 DIAGNOSIS — M2040 Other hammer toe(s) (acquired), unspecified foot: Secondary | ICD-10-CM

## 2019-06-21 DIAGNOSIS — I872 Venous insufficiency (chronic) (peripheral): Secondary | ICD-10-CM

## 2019-06-21 DIAGNOSIS — L600 Ingrowing nail: Secondary | ICD-10-CM

## 2019-06-21 DIAGNOSIS — I8311 Varicose veins of right lower extremity with inflammation: Secondary | ICD-10-CM

## 2019-06-21 DIAGNOSIS — M2042 Other hammer toe(s) (acquired), left foot: Secondary | ICD-10-CM | POA: Diagnosis not present

## 2019-08-04 ENCOUNTER — Other Ambulatory Visit: Payer: Self-pay | Admitting: Internal Medicine

## 2019-08-16 ENCOUNTER — Ambulatory Visit: Payer: Medicare HMO | Admitting: Podiatry

## 2019-08-17 ENCOUNTER — Other Ambulatory Visit: Payer: Self-pay

## 2019-08-17 ENCOUNTER — Ambulatory Visit: Payer: Medicare HMO | Admitting: Podiatry

## 2019-08-17 ENCOUNTER — Encounter: Payer: Self-pay | Admitting: Podiatry

## 2019-08-17 DIAGNOSIS — I8312 Varicose veins of left lower extremity with inflammation: Secondary | ICD-10-CM

## 2019-08-17 DIAGNOSIS — I8311 Varicose veins of right lower extremity with inflammation: Secondary | ICD-10-CM | POA: Diagnosis not present

## 2019-08-17 DIAGNOSIS — R601 Generalized edema: Secondary | ICD-10-CM

## 2019-08-20 NOTE — Progress Notes (Signed)
Subjective:  Patient ID: Danielle Delgado, female    DOB: 1950/04/04,  MRN: [id number]  Chief Complaint  Patient presents with   Nail Problem    recheck the big toenail on the right due to ruba vaccum and the 2nd toe on the right is red     69 y.o. female presents with the above complaint.  Patient presents with a follow-up of bilateral erythema/varicosities secondary to varicose vein.  Patient states that she is doing a lot better.  She also had an ingrown removed by me during last visit which seems to be doing really better.  She denies any other acute complaints.  She would like to discuss swelling as well as any other treatment options.   Review of Systems: Negative except as noted in the HPI. Denies N/V/F/Ch.  Past Medical History:  Diagnosis Date   Asthma    Chronic vaginitis    Diabetes mellitus without complication ([place])    Dyspareunia in female    Eczema    FH: breast cancer    FH: colon cancer    GERD (gastroesophageal reflux disease)    Hiatus hernia syndrome    Hyperlipidemia    Hypertension    Hypothyroidism    Morbid obesity ([place])    Postmenopausal    Sleep apnea    Superficial varicosities    Vaginal atrophy     Current Outpatient Medications:    ACCU-CHEK AVIVA PLUS test strip, , Disp: , Rfl:    acetaminophen (TYLENOL) 500 MG tablet, Take 1,000 mg by mouth every 6 (six) hours as needed., Disp: , Rfl:    acetaminophen (TYLENOL) 650 MG CR tablet, Take 650 mg by mouth every 8 (eight) hours as needed for pain., Disp: , Rfl:    albuterol (PROAIR HFA) 108 (90 Base) MCG/ACT inhaler, Inhale 2 puffs into the lungs every 6 (six) hours as needed for wheezing or shortness of breath., Disp: 8 g, Rfl: 0   aspirin EC 81 MG tablet, Take 81 mg by mouth daily., Disp: , Rfl:    atorvastatin (LIPITOR) 10 MG tablet, , Disp: , Rfl:    Biotin 5000 MCG TABS, Take 1 tablet by mouth daily., Disp: , Rfl:    calcium carbonate (OS-CAL) 600 MG TABS tablet, Take  600 mg by mouth 2 (two) times daily with a meal., Disp: , Rfl:    cetirizine (ZYRTEC) 10 MG tablet, Take 10 mg by mouth daily., Disp: , Rfl:    diclofenac sodium (VOLTAREN) 1 % GEL, Apply 4 g topically 4 (four) times daily., Disp: 100 g, Rfl: 2   doxycycline (VIBRA-TABS) 100 MG tablet, Take 1 tablet (100 mg total) by mouth 2 (two) times daily. (Patient not taking: Reported on 05/29/2019), Disp: 20 tablet, Rfl: 0   doxycycline (VIBRA-TABS) 100 MG tablet, Take 1 tablet (100 mg total) by mouth 2 (two) times daily. (Patient not taking: Reported on 05/29/2019), Disp: 20 tablet, Rfl: 0   ferrous sulfate 325 (65 FE) MG tablet, Take 325 mg by mouth daily with breakfast., Disp: , Rfl:    glipiZIDE (GLUCOTROL XL) 5 MG 24 hr tablet, Take 5 mg by mouth daily with breakfast., Disp: , Rfl:    levothyroxine (SYNTHROID) 112 MCG tablet, , Disp: , Rfl:    losartan-hydrochlorothiazide (HYZAAR) 50-12.5 MG per tablet, , Disp: , Rfl:    magnesium oxide (MAG-OX) 400 MG tablet, Take 400 mg by mouth daily., Disp: , Rfl:    Melatonin 1 MG TABS, Take by mouth., Disp: ,  Rfl:    metFORMIN (GLUCOPHAGE) 1000 MG tablet, Take by mouth., Disp: , Rfl:    mometasone (ASMANEX 30 METERED DOSES) 220 MCG/INH inhaler, Inhale into the lungs., Disp: , Rfl:    montelukast (SINGULAIR) 10 MG tablet, TAKE 1 TABLET EVERY DAY, Disp: 90 tablet, Rfl: 1   NURTEC 75 MG TBDP, , Disp: , Rfl:    Omega-3 Fatty Acids (FISH OIL CONCENTRATE) 300 MG CAPS, Take by mouth. , Disp: , Rfl:    pantoprazole (PROTONIX) 40 MG tablet, TAKE 1 TABLET EVERY DAY, Disp: 90 tablet, Rfl: 1  Social History   Tobacco Use  Smoking Status Never Smoker  Smokeless Tobacco Never Used    Allergies  Allergen Reactions   Biaxin [Clarithromycin]    Propulsid [Cisapride] Hives   Sulfa Antibiotics Hives   Objective:  There were no vitals filed for this visit. There is no height or weight on file to calculate BMI. Constitutional Well developed. Well  nourished.  Vascular Dorsalis pedis pulses palpable bilaterally. Posterior tibial pulses palpable bilaterally. Capillary refill normal to all digits.  No cyanosis or clubbing noted. Pedal hair growth normal.  Neurologic Normal speech. Oriented to person, place, and time. Epicritic sensation to Stefanick touch grossly present bilaterally.  Dermatologic  bilateral lower extremity varicosities are improving.  No erythema or inflammation noted.  No crepitus or fluctuation noted bilaterally.  3+ pitting edema noted bilaterally.  Orthopedic: Normal joint ROM without pain or crepitus bilaterally. No visible deformities. No bony tenderness.   Radiographs: 3 views of skeletally mature adult right foot No bony abnormalities identified.  Mild arthritic changes in the midfoot noted.  Ankle joint and mortise within normal limits.  Mild arthritic changes of the first metatarsal phalangeal joint noted. Assessment:   1. Varicose veins of both lower extremities with inflammation   2. Generalized edema    Plan:  Patient was evaluated and treated and all questions answered.  Bilateral lower extremity phlebitis secondary to varicose veins noted -Resolving and doing well.  Patient still has varicose veins that are present with a phlebitis.  Patient is following with vein and vascular for further work-up and follow-up.  Edema bilateral lower extremity -I explained to patient the etiology of edema various treatment options were extensively discussed.  I discussed with the patient that elevation as well as compression socks are the best treatment options for bilateral lower extremity.  Patient states that she understands that and will continue to do so.  No follow-ups on file.

## 2019-08-21 ENCOUNTER — Telehealth: Payer: Self-pay | Admitting: *Deleted

## 2019-08-21 NOTE — Telephone Encounter (Signed)
-----   Message from Felipa Furnace, DPM sent at 08/20/2019  7:34 PM EDT ----- Regarding: Fax my last note from couple of days ago Hi Danielle Delgado,  Would you be able to fax my last note to Dr. Rosario Jacks her primary care physician.  Patient wanted me to do that.  Thank you let me know if any issues.

## 2019-08-21 NOTE — Telephone Encounter (Signed)
I faxed clinicals of 08/17/2019 to Dr. Rosario Jacks.

## 2019-08-28 ENCOUNTER — Other Ambulatory Visit: Payer: Self-pay

## 2019-08-28 ENCOUNTER — Encounter (INDEPENDENT_AMBULATORY_CARE_PROVIDER_SITE_OTHER): Payer: Self-pay | Admitting: Vascular Surgery

## 2019-08-28 ENCOUNTER — Ambulatory Visit (INDEPENDENT_AMBULATORY_CARE_PROVIDER_SITE_OTHER): Payer: Medicare HMO | Admitting: Vascular Surgery

## 2019-08-28 VITALS — BP 146/85 | HR 89 | Ht 64.0 in | Wt 245.0 lb

## 2019-08-28 DIAGNOSIS — I890 Lymphedema, not elsewhere classified: Secondary | ICD-10-CM

## 2019-08-28 DIAGNOSIS — M1712 Unilateral primary osteoarthritis, left knee: Secondary | ICD-10-CM | POA: Diagnosis not present

## 2019-08-28 DIAGNOSIS — M79604 Pain in right leg: Secondary | ICD-10-CM | POA: Diagnosis not present

## 2019-08-28 DIAGNOSIS — I872 Venous insufficiency (chronic) (peripheral): Secondary | ICD-10-CM | POA: Diagnosis not present

## 2019-08-28 DIAGNOSIS — M79605 Pain in left leg: Secondary | ICD-10-CM

## 2019-08-28 NOTE — Progress Notes (Addendum)
MRN : [id number]  Danielle Delgado is a 69 y.o. (07-07-1950) female who presents with chief complaint of  Chief Complaint  Patient presents with  . Follow-up    3 mo F/U no studies  .  History of Present Illness:   The patient returns for followup evaluation 3 months after the initial visit. The patient continues to have pain in the lower extremities with dependency. The pain is lessened with elevation. Graduated compression stockings, Class I (20-30 mmHg), have been worn but the stockings do not eliminate the leg pain. Over-the-counter analgesics do not improve the symptoms. The degree of discomfort continues to interfere with daily activities. The patient notes the pain in the legs is causing problems with daily exercise, at the workplace and even with household activities and maintenance such as standing in the kitchen preparing meals and doing dishes.   Venous ultrasound shows normal deep venous system, no evidence of acute or chronic DVT.  Superficial reflux is present in the bilateral GSV  Current Meds  Medication Sig  . ACCU-CHEK AVIVA PLUS test strip   . acetaminophen (TYLENOL) 500 MG tablet Take 1,000 mg by mouth every 6 (six) hours as needed.  Marland Kitchen acetaminophen (TYLENOL) 650 MG CR tablet Take 650 mg by mouth every 8 (eight) hours as needed for pain.  Marland Kitchen albuterol (PROAIR HFA) 108 (90 Base) MCG/ACT inhaler Inhale 2 puffs into the lungs every 6 (six) hours as needed for wheezing or shortness of breath.  Marland Kitchen aspirin EC 81 MG tablet Take 81 mg by mouth daily.  Marland Kitchen atorvastatin (LIPITOR) 10 MG tablet   . Biotin 5000 MCG TABS Take 1 tablet by mouth daily.  . calcium carbonate (OS-CAL) 600 MG TABS tablet Take 600 mg by mouth 2 (two) times daily with a meal.  . cetirizine (ZYRTEC) 10 MG tablet Take 10 mg by mouth daily.  . diclofenac sodium (VOLTAREN) 1 % GEL Apply 4 g topically 4 (four) times daily.  . ferrous sulfate 325 (65 FE) MG tablet Take 325 mg by mouth daily with breakfast.  .  glipiZIDE (GLUCOTROL XL) 5 MG 24 hr tablet Take 5 mg by mouth daily with breakfast.  . levothyroxine (SYNTHROID) 112 MCG tablet   . losartan-hydrochlorothiazide (HYZAAR) 50-12.5 MG per tablet   . magnesium oxide (MAG-OX) 400 MG tablet Take 400 mg by mouth daily.  . Melatonin 1 MG TABS Take by mouth.  . metFORMIN (GLUCOPHAGE) 1000 MG tablet Take by mouth.  . mometasone (ASMANEX 30 METERED DOSES) 220 MCG/INH inhaler Inhale into the lungs.  . montelukast (SINGULAIR) 10 MG tablet TAKE 1 TABLET EVERY DAY  . Omega-3 Fatty Acids (FISH OIL CONCENTRATE) 300 MG CAPS Take by mouth.   . pantoprazole (PROTONIX) 40 MG tablet TAKE 1 TABLET EVERY DAY    Past Medical History:  Diagnosis Date  . Asthma   . Chronic vaginitis   . Diabetes mellitus without complication ([place])   . Dyspareunia in female   . Eczema   . FH: breast cancer   . FH: colon cancer   . GERD (gastroesophageal reflux disease)   . Hiatus hernia syndrome   . Hyperlipidemia   . Hypertension   . Hypothyroidism   . Morbid obesity ([place])   . Postmenopausal   . Sleep apnea   . Superficial varicosities   . Vaginal atrophy     Past Surgical History:  Procedure Laterality Date  . BREAST EXCISIONAL BIOPSY Right 1996   neg  . BREAST SURGERY  biopsy  . CHOLECYSTECTOMY    . COLONOSCOPY WITH PROPOFOL N/A 10/12/2014   Procedure: COLONOSCOPY WITH PROPOFOL;  Surgeon: Manya Silvas, MD;  Location: [facility] ENDOSCOPY;  Service: Endoscopy;  Laterality: N/A;  . DILATION AND CURETTAGE OF UTERUS    . ESOPHAGOGASTRODUODENOSCOPY (EGD) WITH PROPOFOL N/A 10/12/2014   Procedure: ESOPHAGOGASTRODUODENOSCOPY (EGD) WITH PROPOFOL;  Surgeon: Manya Silvas, MD;  Location[facility] ENDOSCOPY;  Service: Endoscopy;  Laterality: N/A;  . NASAL SINUS SURGERY    . VAGINAL HYSTERECTOMY  1982    Social History Social History   Tobacco Use  . Smoking status: Never Smoker  . Smokeless tobacco: Never Used  Vaping Use  . Vaping Use: Never used  Substance Use  Topics  . Alcohol use: No    Alcohol/week: 0.0 standard drinks  . Drug use: No    Family History Family History  Problem Relation Age of Onset  . Osteoporosis Mother   . Breast cancer Maternal Aunt 60  . Diabetes Maternal Grandmother   . Diabetes Maternal Grandfather   . Heart disease Maternal Grandfather   . Colon cancer Cousin   . Breast cancer Cousin 27       mat cousin  . Ovarian cancer Neg Hx     Allergies  Allergen Reactions  . Biaxin [Clarithromycin]   . Propulsid [Cisapride] Hives  . Sulfa Antibiotics Hives     REVIEW OF SYSTEMS (Negative unless checked)  Constitutional: [] Weight loss  [] Fever  [] Chills Cardiac: [] Chest pain   [] Chest pressure   [] Palpitations   [] Shortness of breath when laying flat   [] Shortness of breath with exertion. Vascular:  [] Pain in legs with walking   [x] Pain in legs at rest  [] History of DVT   [] Phlebitis   [] Swelling in legs   [x] Varicose veins   [] Non-healing ulcers Pulmonary:   [] Uses home oxygen   [] Productive cough   [] Hemoptysis   [] Wheeze  [] COPD   [] Asthma Neurologic:  [] Dizziness   [] Seizures   [] History of stroke   [] History of TIA  [] Aphasia   [] Vissual changes   [] Weakness or numbness in arm   [] Weakness or numbness in leg Musculoskeletal:   [] Joint swelling   [] Joint pain   [] Low back pain Hematologic:  [] Easy bruising  [] Easy bleeding   [] Hypercoagulable state   [] Anemic Gastrointestinal:  [] Diarrhea   [] Vomiting  [] Gastroesophageal reflux/heartburn   [] Difficulty swallowing. Genitourinary:  [] Chronic kidney disease   [] Difficult urination  [] Frequent urination   [] Blood in urine Skin:  [] Rashes   [] Ulcers  Psychological:  [] History of anxiety   []  History of major depression.  Physical Examination  Vitals:   08/28/19 1559  BP: (!) 146/85  Pulse: 89  Weight: 245 lb (111.1 kg)  Height: 5\' 4"  (1.626 m)   Body mass index is 42.05 kg/m. Gen: WD/WN, NAD Head: [place]/AT, No temporalis wasting.  Ear/Nose/Throat: Hearing  grossly intact, nares w/o erythema or drainage Eyes: PER, EOMI, sclera nonicteric.  Neck: Supple, no large masses.   Pulmonary:  Good air movement, no audible wheezing bilaterally, no use of accessory muscles.  Cardiac: RRR, no JVD Vascular: Large varicosities present extensively greater than 10 mm right leg.  Moderate venous stasis changes to the legs bilaterally.  2+ soft pitting edema Vessel Right Left  Radial Palpable Palpable  PT Palpable Palpable  DP Palpable Palpable  Gastrointestinal: Non-distended. No guarding/no peritoneal signs.  Musculoskeletal: M/S 5/5 throughout.  No deformity or atrophy.  Neurologic: CN 2-12 intact. Symmetrical.  Speech is fluent.  Motor exam as listed above. Psychiatric: Judgment intact, Mood & affect appropriate for pt's clinical situation. Dermatologic: No rashes or ulcers noted.  No changes consistent with cellulitis.   CBC Lab Results  Component Value Date   WBC 11.4 (H) 11/06/2014   HGB 11.8 (L) 11/06/2014   HCT 35.5 11/06/2014   MCV 89.3 11/06/2014   PLT 284 11/06/2014    BMET No results found for: NA, K, CL, CO2, GLUCOSE, BUN, CREATININE, CALCIUM, GFRNONAA, GFRAA CrCl cannot be calculated (No successful lab value found.).  COAG No results found for: INR, PROTIME  Radiology No results found.   Assessment/Plan 1. Pain in both lower extremities Recommend  I have reviewed my previous  discussion with the patient regarding  varicose veins and why they cause symptoms. Patient will continue  wearing graduated compression stockings class 1 on a daily basis, beginning first thing in the morning and removing them in the evening.    In addition, behavioral modification including elevation during the day was again discussed and this will continue.  The patient has utilized over the counter pain medications and has been exercising.  However, at this time conservative therapy has not alleviated the patient's symptoms of leg pain and  swelling  Recommend: laser ablation of the right (first) and then left great saphenous veins to eliminate the symptoms of pain and swelling of the lower extremities caused by the severe superficial venous reflux disease.   2. Chronic venous insufficiency No surgery or intervention at this point in time.    I have had a long discussion with the patient regarding venous insufficiency and why it  causes symptoms. I have discussed with the patient the chronic skin changes that accompany venous insufficiency and the long term sequela such as infection and ulceration.  Patient will begin wearing graduated compression stockings class 1 (20-30 mmHg) or compression wraps on a daily basis a prescription was given. The patient will put the stockings on first thing in the morning and removing them in the evening. The patient is instructed specifically not to sleep in the stockings.    In addition, behavioral modification including several periods of elevation of the lower extremities during the day will be continued. I have demonstrated that proper elevation is a position with the ankles at heart level.  The patient is instructed to begin routine exercise, especially walking on a daily basis  Following the review of the ultrasound the patient will follow up in 2-3 months to reassess the degree of swelling and the control that graduated compression stockings or compression wraps  is offering.   The patient can be assessed for a Lymph Pump at that time  3. Lymphedema No surgery or intervention at this point in time.    I have had a long discussion with the patient regarding venous insufficiency and why it  causes symptoms. I have discussed with the patient the chronic skin changes that accompany venous insufficiency and the long term sequela such as infection and ulceration.  Patient will begin wearing graduated compression stockings class 1 (20-30 mmHg) or compression wraps on a daily basis a prescription was  given. The patient will put the stockings on first thing in the morning and removing them in the evening. The patient is instructed specifically not to sleep in the stockings.    In addition, behavioral modification including several periods of elevation of the lower extremities during the day will be continued. I have demonstrated that proper elevation is a position with  the ankles at heart level.  The patient is instructed to begin routine exercise, especially walking on a daily basis  Following the review of the ultrasound the patient will follow up in 2-3 months to reassess the degree of swelling and the control that graduated compression stockings or compression wraps  is offering.   The patient can be assessed for a Lymph Pump at that time  4. Primary osteoarthritis of left knee Continue NSAID medications as already ordered, these medications have been reviewed and there are no changes at this time.  Continued activity and therapy was stressed.    Hortencia Pilar, MD  08/28/2019 4:16 PM

## 2019-08-31 ENCOUNTER — Encounter: Payer: Medicare HMO | Admitting: Obstetrics and Gynecology

## 2019-09-06 ENCOUNTER — Encounter: Payer: Self-pay | Admitting: Obstetrics and Gynecology

## 2019-09-06 ENCOUNTER — Other Ambulatory Visit: Payer: Self-pay

## 2019-09-06 ENCOUNTER — Ambulatory Visit (INDEPENDENT_AMBULATORY_CARE_PROVIDER_SITE_OTHER): Payer: Medicare HMO | Admitting: Obstetrics and Gynecology

## 2019-09-06 VITALS — BP 149/79 | HR 86 | Ht 64.0 in | Wt 246.0 lb

## 2019-09-06 DIAGNOSIS — Z1231 Encounter for screening mammogram for malignant neoplasm of breast: Secondary | ICD-10-CM

## 2019-09-06 DIAGNOSIS — Z1211 Encounter for screening for malignant neoplasm of colon: Secondary | ICD-10-CM

## 2019-09-06 DIAGNOSIS — Z01419 Encounter for gynecological examination (general) (routine) without abnormal findings: Secondary | ICD-10-CM

## 2019-09-06 DIAGNOSIS — N952 Postmenopausal atrophic vaginitis: Secondary | ICD-10-CM

## 2019-09-06 DIAGNOSIS — Z6841 Body Mass Index (BMI) 40.0 and over, adult: Secondary | ICD-10-CM

## 2019-09-06 NOTE — Progress Notes (Signed)
Pt present for annual exam. Pt stated that she was doing well. Pt had all COVID vaccine.

## 2019-09-06 NOTE — Patient Instructions (Signed)

## 2019-09-06 NOTE — Progress Notes (Signed)
ANNUAL PREVENTATIVE CARE GYNECOLOGY  ENCOUNTER NOTE  Subjective:       Danielle Delgado is a 69 y.o. [id number] married female here for a routine annual gynecologic exam. The patient is sexually active (occasionally). The patient is not taking hormone replacement therapy. Patient denies post-menopausal vaginal bleeding. The patient wears seatbelts: yes. The patient participates in regular exercise: no. Has the patient ever been transfused or tattooed?: no. The patient reports that there is not domestic violence in her life.  Patient reports that she has been experiencing bilateral knee pain. She has had injections in the past.  Currently notes that she is starting on a diet to lose weight to help deal with her pain.   She also notes that she has plans to have her varicose veins treated.    Gynecologic History No LMP recorded. Patient has had a hysterectomy. Last Pap: not required.  Patient has had a hysterectomy.  Last mammogram: 11/28/2018. Results were: normal Last Colonoscopy: within the past 10 years.    Obstetric History OB History  Gravida Para Term Preterm AB Living  1 1   1   1   SAB TAB Ectopic Multiple Live Births          1    # Outcome Date GA Lbr Len/2nd Weight Sex Delivery Anes PTL Lv  1 Preterm    5 lb (2.268 kg)  Vag-Spont   LIV    Past Medical History:  Diagnosis Date  . Asthma   . Chronic vaginitis   . Diabetes mellitus without complication ([place])   . Dyspareunia in female   . Eczema   . FH: breast cancer   . FH: colon cancer   . GERD (gastroesophageal reflux disease)   . Hiatus hernia syndrome   . Hyperlipidemia   . Hypertension   . Hypothyroidism   . Morbid obesity ([place])   . Postmenopausal   . Sleep apnea   . Superficial varicosities   . Vaginal atrophy     Family History  Problem Relation Age of Onset  . Osteoporosis Mother   . Breast cancer Maternal Aunt 60  . Diabetes Maternal Grandmother   . Diabetes Maternal Grandfather   . Heart disease  Maternal Grandfather   . Colon cancer Cousin   . Breast cancer Cousin 45       mat cousin  . Ovarian cancer Neg Hx     Past Surgical History:  Procedure Laterality Date  . BREAST EXCISIONAL BIOPSY Right 1996   neg  . BREAST SURGERY     biopsy  . CHOLECYSTECTOMY    . COLONOSCOPY WITH PROPOFOL N/A 10/12/2014   Procedure: COLONOSCOPY WITH PROPOFOL;  Surgeon: Manya Silvas, MD;  Location[facility] ENDOSCOPY;  Service: Endoscopy;  Laterality: N/A;  . DILATION AND CURETTAGE OF UTERUS    . ESOPHAGOGASTRODUODENOSCOPY (EGD) WITH PROPOFOL N/A 10/12/2014   Procedure: ESOPHAGOGASTRODUODENOSCOPY (EGD) WITH PROPOFOL;  Surgeon: Manya Silvas, MD;  Location: [facility] ENDOSCOPY;  Service: Endoscopy;  Laterality: N/A;  . NASAL SINUS SURGERY    . VAGINAL HYSTERECTOMY  1982    Social History   Socioeconomic History  . Marital status: Married    Spouse name: Not on file  . Number of children: Not on file  . Years of education: Not on file  . Highest education level: Not on file  Occupational History  . Not on file  Tobacco Use  . Smoking status: Never Smoker  . Smokeless tobacco: Never Used  Vaping Use  .  Vaping Use: Never used  Substance and Sexual Activity  . Alcohol use: No    Alcohol/week: 0.0 standard drinks  . Drug use: No  . Sexual activity: Not Currently  Other Topics Concern  . Not on file  Social History Narrative  . Not on file   Social Determinants of Health   Financial Resource Strain:   . Difficulty of Paying Living Expenses:   Food Insecurity:   . Worried About Charity fundraiser in the Last Year:   . Arboriculturist in the Last Year:   Transportation Needs:   . Film/video editor (Medical):   Marland Kitchen Lack of Transportation (Non-Medical):   Physical Activity:   . Days of Exercise per Week:   . Minutes of Exercise per Session:   Stress:   . Feeling of Stress :   Social Connections:   . Frequency of Communication with Friends and Family:   . Frequency of Social  Gatherings with Friends and Family:   . Attends Religious Services:   . Active Member of Clubs or Organizations:   . Attends Archivist Meetings:   Marland Kitchen Marital Status:   Intimate Partner Violence:   . Fear of Current or Ex-Partner:   . Emotionally Abused:   Marland Kitchen Physically Abused:   . Sexually Abused:     Current Outpatient Medications on File Prior to Visit  Medication Sig Dispense Refill  . ACCU-CHEK AVIVA PLUS test strip     . acetaminophen (TYLENOL) 500 MG tablet Take 1,000 mg by mouth every 6 (six) hours as needed.    Marland Kitchen acetaminophen (TYLENOL) 650 MG CR tablet Take 650 mg by mouth every 8 (eight) hours as needed for pain.    Marland Kitchen albuterol (PROAIR HFA) 108 (90 Base) MCG/ACT inhaler Inhale 2 puffs into the lungs every 6 (six) hours as needed for wheezing or shortness of breath. 8 g 0  . aspirin EC 81 MG tablet Take 81 mg by mouth daily.    Marland Kitchen atorvastatin (LIPITOR) 10 MG tablet     . Biotin 5000 MCG TABS Take 1 tablet by mouth daily.    . calcium carbonate (OS-CAL) 600 MG TABS tablet Take 600 mg by mouth 2 (two) times daily with a meal.    . cetirizine (ZYRTEC) 10 MG tablet Take 10 mg by mouth daily.    . diclofenac sodium (VOLTAREN) 1 % GEL Apply 4 g topically 4 (four) times daily. 100 g 2  . ferrous sulfate 325 (65 FE) MG tablet Take 325 mg by mouth daily with breakfast.    . glipiZIDE (GLUCOTROL XL) 5 MG 24 hr tablet Take 5 mg by mouth daily with breakfast.    . levothyroxine (SYNTHROID) 137 MCG tablet Take 137 mcg by mouth daily before breakfast.    . losartan-hydrochlorothiazide (HYZAAR) 50-12.5 MG per tablet     . magnesium oxide (MAG-OX) 400 MG tablet Take 400 mg by mouth daily.    . Melatonin 1 MG TABS Take by mouth as needed.     . metFORMIN (GLUCOPHAGE) 1000 MG tablet Take by mouth.    . mometasone (ASMANEX 30 METERED DOSES) 220 MCG/INH inhaler Inhale into the lungs.    . montelukast (SINGULAIR) 10 MG tablet TAKE 1 TABLET EVERY DAY 90 tablet 1  . Omega-3 Fatty Acids  (FISH OIL CONCENTRATE) 300 MG CAPS Take by mouth.     . pantoprazole (PROTONIX) 40 MG tablet TAKE 1 TABLET EVERY DAY 90 tablet 1   No  current facility-administered medications on file prior to visit.    Allergies  Allergen Reactions  . Biaxin [Clarithromycin]   . Propulsid [Cisapride] Hives  . Sulfa Antibiotics Hives      Review of Systems ROS Review of Systems - General ROS: negative for - chills, fatigue, fever, hot flashes, night sweats, weight gain or weight loss Psychological ROS: negative for - anxiety, decreased libido, depression, mood swings, physical abuse or sexual abuse Ophthalmic ROS: negative for - blurry vision, eye pain or loss of vision ENT ROS: negative for - headaches, hearing change, visual changes or vocal changes Allergy and Immunology ROS: negative for - hives, itchy/watery eyes or seasonal allergies Hematological and Lymphatic ROS: negative for - bleeding problems, bruising, swollen lymph nodes or weight loss Endocrine ROS: negative for - galactorrhea, hair pattern changes, hot flashes, malaise/lethargy, mood swings, palpitations, polydipsia/polyuria, skin changes, temperature intolerance or unexpected weight changes Breast ROS: negative for - new or changing breast lumps or nipple discharge Respiratory ROS: negative for - cough or shortness of breath Cardiovascular ROS: negative for - chest pain, irregular heartbeat, palpitations or shortness of breath Gastrointestinal ROS: no abdominal pain, change in bowel habits, or black or bloody stools Genito-Urinary ROS: no dysuria, trouble voiding, or hematuria.  Musculoskeletal ROS: negative for joint stiffness.  Positive for bilateral knee pain bilaterally.  Neurological ROS: negative for - bowel and bladder control changes Dermatological ROS: negative for rash and skin lesion changes   Objective:   BP (!) 149/79   Pulse 86   Ht 5\' 4"  (1.626 m)   Wt 246 lb (111.6 kg)   BMI 42.23 kg/m  CONSTITUTIONAL:  Well-developed, well-nourished female in no acute distress. Morbid obesity PSYCHIATRIC: Normal mood and affect. Normal behavior. Normal judgment and thought content. [place]: Alert and oriented to person, place, and time. Normal muscle tone coordination. No cranial nerve deficit noted. HENT:  Normocephalic, atraumatic, External right and left ear normal. Oropharynx is clear and moist EYES: Conjunctivae and EOM are normal. Pupils are equal, round, and reactive to [place]. No scleral icterus.  NECK: Normal range of motion, supple, no masses.  Normal thyroid.  SKIN: Skin is warm and dry. No rash noted. Not diaphoretic. No erythema. No pallor.  Positive for varicose veins on lower extremities bilaterally.  CARDIOVASCULAR: Normal heart rate noted, regular rhythm, no murmur. RESPIRATORY: Clear to auscultation bilaterally. Effort and breath sounds normal, no problems with respiration noted. BREASTS: Symmetric in size. No masses, skin changes, nipple drainage, or lymphadenopathy. ABDOMEN: Soft, normal bowel sounds, no distention noted.  No tenderness, rebound or guarding.  BLADDER: Normal PELVIC:  Bladder no bladder distension noted  Urethra: normal appearing urethra with no masses, tenderness or lesions  Vulva: normal appearing vulva with no masses, tenderness or lesions  Vagina: normal appearing vagina with discharge, mild atrophy   Cervix: surgically absent  Uterus: surgically absent, vaginal cuff well healed  Adnexa: normal adnexa in size, nontender and no masses  RV: External Exam NormaI, No Rectal Masses and Normal Sphincter tone  MUSCULOSKELETAL: Normal range of motion. No tenderness.  No cyanosis, clubbing, or edema.  2+ distal pulses. LYMPHATIC: No Axillary, Supraclavicular, or Inguinal Adenopathy.   Labs: Last labs in 2018 (noted in Care Everywhere)    Assessment:   Routine gynecologic exam  Vaginal atrophy Screening for breast cancer  Morbid obesity with BMI of 40.0-44.9, adult  (HCC)  Menopause  Knee pain  Plan:  Pap: Not needed Mammogram: Ordered Stool Guaiac Testing:  Patient desires to have  Cologard performed. Has had colonoscopy within the past 10 years, but notes conflicting responses from GI and her PCP on when to return (5 vs 10 years) due to family history of . Has been doing fecal stool cards but desires to try Cologard. Will order.  Labs: No labs ordered. To be performed with PCP.  Routine preventative health maintenance measures emphasized: Exercise/Diet/Weight control, Alcohol/Substance use risks and Stress Management Vaginal atrophy present, patient requests samples of Premarin cream.  Given.  Knee pain being managed with injections. Working on weight loss.  Return to [place], or sooner as needed.    Rubie Maid, MD  [facility]

## 2019-09-07 ENCOUNTER — Encounter: Payer: Self-pay | Admitting: Obstetrics and Gynecology

## 2019-09-20 ENCOUNTER — Encounter: Payer: Self-pay | Admitting: Internal Medicine

## 2019-09-20 ENCOUNTER — Ambulatory Visit: Payer: Medicare HMO | Admitting: Internal Medicine

## 2019-09-20 ENCOUNTER — Other Ambulatory Visit: Payer: Self-pay

## 2019-09-20 VITALS — BP 164/79 | HR 78 | Temp 96.9°F | Resp 16 | Ht 64.0 in | Wt 242.5 lb

## 2019-09-20 DIAGNOSIS — J0100 Acute maxillary sinusitis, unspecified: Secondary | ICD-10-CM | POA: Diagnosis not present

## 2019-09-20 DIAGNOSIS — R05 Cough: Secondary | ICD-10-CM | POA: Diagnosis not present

## 2019-09-20 DIAGNOSIS — R059 Cough, unspecified: Secondary | ICD-10-CM

## 2019-09-20 MED ORDER — AMOXICILLIN-POT CLAVULANATE 875-125 MG PO TABS: 1.0000 | ORAL_TABLET | Freq: Two times a day (BID) | ORAL | 0 refills | Status: DC

## 2019-09-20 MED ORDER — BENZONATATE 100 MG PO CAPS: 100.0000 mg | ORAL_CAPSULE | Freq: Two times a day (BID) | ORAL | 0 refills | Status: DC | PRN

## 2019-09-20 NOTE — Progress Notes (Signed)
[facility] [place], [place] [address]  Pulmonary Sleep Medicine   Office Visit Note  Patient Name: Danielle Delgado DOB: [birth year] MRN [id number]  Date of Service: 09/20/2019  Complaints/HPI: Pt is seen for acute visit.  She reports she has been coughing, for 5 days.  It progressed to headache, and wheezing at night.  She was tested for covid, and was told it is negative.  She is reporting some PND and mildly sore throat.  She is using her asthmanex, and rescue albuterol inhaler with some relief. She is wearing her cpap also.   ROS  General: (-) fever, (-) chills, (-) night sweats, (-) weakness Skin: (-) rashes, (-) itching,. Eyes: (-) visual changes, (-) redness, (-) itching. Nose and Sinuses: (-) nasal stuffiness or itchiness, (-) postnasal drip, (-) nosebleeds, (-) sinus trouble. Mouth and Throat: (-) sore throat, (-) hoarseness. Neck: (-) swollen glands, (-) enlarged thyroid, (-) neck pain. Respiratory: + cough, (-) bloody sputum, - shortness of breath, + wheezing. Cardiovascular: - ankle swelling, (-) chest pain. Lymphatic: (-) lymph node enlargement. Neurologic: (-) numbness, (-) tingling. Psychiatric: (-) anxiety, (-) depression   Current Medication: Outpatient Encounter Medications as of 09/20/2019  Medication Sig Note  . ACCU-CHEK AVIVA PLUS test strip  01/17/2014: Received from: External Pharmacy  . acetaminophen (TYLENOL) 500 MG tablet Take 1,000 mg by mouth every 6 (six) hours as needed.   Marland Kitchen acetaminophen (TYLENOL) 650 MG CR tablet Take 650 mg by mouth every 8 (eight) hours as needed for pain.   Marland Kitchen albuterol (PROAIR HFA) 108 (90 Base) MCG/ACT inhaler Inhale 2 puffs into the lungs every 6 (six) hours as needed for wheezing or shortness of breath.   Marland Kitchen aspirin EC 81 MG tablet Take 81 mg by mouth daily.   Marland Kitchen atorvastatin (LIPITOR) 10 MG tablet  01/17/2014: Received from: External Pharmacy  . Biotin 5000 MCG TABS Take 1 tablet by mouth daily.   .  calcium carbonate (OS-CAL) 600 MG TABS tablet Take 600 mg by mouth 2 (two) times daily with a meal.   . cetirizine (ZYRTEC) 10 MG tablet Take 10 mg by mouth daily.   . diclofenac sodium (VOLTAREN) 1 % GEL Apply 4 g topically 4 (four) times daily.   . ferrous sulfate 325 (65 FE) MG tablet Take 325 mg by mouth daily with breakfast.   . glipiZIDE (GLUCOTROL XL) 5 MG 24 hr tablet Take 5 mg by mouth daily with breakfast.   . levothyroxine (SYNTHROID) 137 MCG tablet Take 137 mcg by mouth daily before breakfast.   . losartan-hydrochlorothiazide (HYZAAR) 50-12.5 MG per tablet  01/17/2014: Received from: External Pharmacy  . magnesium oxide (MAG-OX) 400 MG tablet Take 400 mg by mouth daily.   . Melatonin 1 MG TABS Take by mouth as needed.    . metFORMIN (GLUCOPHAGE) 1000 MG tablet Take by mouth. 11/06/2014: Received from: [place]  . mometasone (ASMANEX 30 METERED DOSES) 220 MCG/INH inhaler Inhale into the lungs.   . montelukast (SINGULAIR) 10 MG tablet TAKE 1 TABLET EVERY DAY   . Omega-3 Fatty Acids (FISH OIL CONCENTRATE) 300 MG CAPS Take by mouth.  11/06/2014: Received from: [place]  . pantoprazole (PROTONIX) 40 MG tablet TAKE 1 TABLET EVERY DAY    No facility-administered encounter medications on file as of 09/20/2019.    Surgical History: Past Surgical History:  Procedure Laterality Date  . BREAST EXCISIONAL BIOPSY Right 1996   neg  . BREAST SURGERY  biopsy  . CHOLECYSTECTOMY    . COLONOSCOPY WITH PROPOFOL N/A 10/12/2014   Procedure: COLONOSCOPY WITH PROPOFOL;  Surgeon: Manya Silvas, MD;  Location: [facility] ENDOSCOPY;  Service: Endoscopy;  Laterality: N/A;  . DILATION AND CURETTAGE OF UTERUS    . ESOPHAGOGASTRODUODENOSCOPY (EGD) WITH PROPOFOL N/A 10/12/2014   Procedure: ESOPHAGOGASTRODUODENOSCOPY (EGD) WITH PROPOFOL;  Surgeon: Manya Silvas, MD;  Location: [facility] ENDOSCOPY;  Service: Endoscopy;  Laterality: N/A;  . NASAL SINUS SURGERY    . VAGINAL  HYSTERECTOMY  1982    Medical History: Past Medical History:  Diagnosis Date  . Asthma   . Chronic vaginitis   . Diabetes mellitus without complication ([place])   . Dyspareunia in female   . Eczema   . FH: breast cancer   . FH: colon cancer   . GERD (gastroesophageal reflux disease)   . Hiatus hernia syndrome   . Hyperlipidemia   . Hypertension   . Hypothyroidism   . Morbid obesity ([place])   . Postmenopausal   . Sleep apnea   . Superficial varicosities   . Vaginal atrophy     Family History: Family History  Problem Relation Age of Onset  . Osteoporosis Mother   . Breast cancer Maternal Aunt 60  . Diabetes Maternal Grandmother   . Diabetes Maternal Grandfather   . Heart disease Maternal Grandfather   . Colon cancer Cousin   . Breast cancer Cousin 80       mat cousin  . Ovarian cancer Neg Hx     Social History: Social History   Socioeconomic History  . Marital status: Married    Spouse name: Not on file  . Number of children: Not on file  . Years of education: Not on file  . Highest education level: Not on file  Occupational History  . Not on file  Tobacco Use  . Smoking status: Never Smoker  . Smokeless tobacco: Never Used  Vaping Use  . Vaping Use: Never used  Substance and Sexual Activity  . Alcohol use: No    Alcohol/week: 0.0 standard drinks  . Drug use: No  . Sexual activity: Not Currently  Other Topics Concern  . Not on file  Social History Narrative  . Not on file   Social Determinants of Health   Financial Resource Strain:   . Difficulty of Paying Living Expenses:   Food Insecurity:   . Worried About Charity fundraiser in the Last Year:   . Arboriculturist in the Last Year:   Transportation Needs:   . Film/video editor (Medical):   Marland Kitchen Lack of Transportation (Non-Medical):   Physical Activity:   . Days of Exercise per Week:   . Minutes of Exercise per Session:   Stress:   . Feeling of Stress :   Social Connections:   . Frequency  of Communication with Friends and Family:   . Frequency of Social Gatherings with Friends and Family:   . Attends Religious Services:   . Active Member of Clubs or Organizations:   . Attends Archivist Meetings:   Marland Kitchen Marital Status:   Intimate Partner Violence:   . Fear of Current or Ex-Partner:   . Emotionally Abused:   Marland Kitchen Physically Abused:   . Sexually Abused:     Vital Signs: Blood pressure (!) 164/79, pulse 78, temperature (!) 96.9 F (36.1 C), resp. rate 16, height 5\' 4"  (1.626 m), weight 242 lb 8 oz (110 kg), SpO2 96 %.  Examination: General Appearance: The patient is well-developed, well-nourished, and in no distress. Skin: Gross inspection of skin unremarkable. Head: normocephalic, no gross deformities. Eyes: no gross deformities noted. ENT: ears appear grossly normal no exudates. Neck: Supple. No thyromegaly. No LAD. Respiratory: clear. Cardiovascular: Normal S1 and S2 without murmur or rub. Extremities: No cyanosis. pulses are equal. Neurologic: Alert and oriented. No involuntary movements.  LABS: No results found for this or any previous visit (from the past 2160 hour(s)).  Radiology: MM 3D SCREEN BREAST BILATERAL  Result Date: 11/29/2018 CLINICAL DATA:  Screening. EXAM: DIGITAL SCREENING BILATERAL MAMMOGRAM WITH TOMO AND CAD COMPARISON:  Previous exam(s). ACR Breast Density Category b: There are scattered areas of fibroglandular density. FINDINGS: There are no findings suspicious for malignancy. Images were processed with CAD. IMPRESSION: No mammographic evidence of malignancy. A result letter of this screening mammogram will be mailed directly to the patient. RECOMMENDATION: Screening mammogram in one year. (Code:SM-B-01Y) BI-RADS CATEGORY  1: Negative. Electronically Signed   By: Lajean Manes M.D.   On: 11/29/2018 10:53    No results found.  No results found.    Assessment and Plan: Patient Active Problem List   Diagnosis Date Noted  .  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 Primary osteoarthritis of left knee 12/05/2018  . Leg pain 10/19/2017  . Nocturnal leg cramps 10/19/2017  . Chronic venous insufficiency 03/02/2017  . Lymphedema 03/02/2017  . Increased BMI 07/10/2015  . Pelvic pain in female 07/10/2015  . Vaginal atrophy 07/10/2015  . Status post vaginal hysterectomy 07/10/2015    1. Acute maxillary sinusitis, recurrence not specified Advised patient to take entire course of antibiotics as prescribed with food. Pt should return to clinic in 7-10 days if symptoms fail to improve or new symptoms develop.  - amoxicillin-clavulanate (AUGMENTIN) 875-125 MG tablet; Take 1 tablet by mouth 2 (two) times daily.  Dispense: 14 tablet; Refill: 0  2. Cough Use tessalon as directed. - benzonatate (TESSALON) 100 MG capsule; Take 1 capsule (100 mg total) by mouth 2 (two) times daily as needed for cough.  Dispense: 20 capsule; Refill: 0  General Counseling: I have discussed the findings of the evaluation and examination with Mariann Laster.  I have also discussed any further diagnostic evaluation thatmay be needed or ordered today. Malary verbalizes understanding of the findings of todays visit. We also reviewed her medications today and discussed drug interactions and side effects including but not limited excessive drowsiness and altered mental states. We also discussed that there is always a risk not just to her but also people around her. she has been encouraged to call the office with any questions or concerns that should arise related to todays visit.  No orders of the defined types were placed in this encounter.    Time spent: 25 This patient was seen by Orson Gear AGNP-C in Collaboration with Dr. Devona Konig as a part of collaborative care agreement.   I have personally obtained a history, examined the patient, evaluated laboratory and imaging results, formulated the assessment and  plan and placed orders.    Allyne Gee, MD [facility] Pulmonary and Critical Care Sleep medicine

## 2019-10-19 ENCOUNTER — Other Ambulatory Visit: Payer: Self-pay

## 2019-10-19 ENCOUNTER — Encounter (INDEPENDENT_AMBULATORY_CARE_PROVIDER_SITE_OTHER): Payer: Self-pay | Admitting: Vascular Surgery

## 2019-10-19 ENCOUNTER — Ambulatory Visit (INDEPENDENT_AMBULATORY_CARE_PROVIDER_SITE_OTHER): Payer: Medicare HMO | Admitting: Vascular Surgery

## 2019-10-19 VITALS — BP 136/78 | HR 82 | Resp 16 | Wt 225.0 lb

## 2019-10-19 DIAGNOSIS — M79605 Pain in left leg: Secondary | ICD-10-CM

## 2019-10-19 DIAGNOSIS — M79604 Pain in right leg: Secondary | ICD-10-CM

## 2019-10-19 DIAGNOSIS — I872 Venous insufficiency (chronic) (peripheral): Secondary | ICD-10-CM

## 2019-10-19 NOTE — Progress Notes (Signed)
    MRN : [id number]  HANSIKA LEAMING is a 69 y.o. (1951-02-21) female who presents with chief complaint of No chief complaint on file. .    The patient's right lower extremity was sterilely prepped and draped.  The ultrasound machine was used to visualize the right great saphenous vein throughout its course.  A segment at the knee was selected for access.  The saphenous vein was accessed without difficulty using ultrasound guidance with a micropuncture needle.   An 0.018  wire was placed beyond the saphenofemoral junction through the sheath and the microneedle was removed.  The 65 cm sheath was then placed over the wire and the wire and dilator were removed.  The laser fiber was placed through the sheath and its tip was placed approximately 2 cm below the saphenofemoral junction.  Tumescent anesthesia was then created with a dilute lidocaine solution.  Laser energy was then delivered with constant withdrawal of the sheath and laser fiber.  Approximately 1278 Joules of energy were delivered over a length of 28 cm.  Sterile dressings were placed.  The patient tolerated the procedure well without complications.

## 2019-10-23 ENCOUNTER — Ambulatory Visit (INDEPENDENT_AMBULATORY_CARE_PROVIDER_SITE_OTHER): Payer: Medicare HMO

## 2019-10-23 ENCOUNTER — Other Ambulatory Visit (INDEPENDENT_AMBULATORY_CARE_PROVIDER_SITE_OTHER): Payer: Self-pay

## 2019-10-23 ENCOUNTER — Other Ambulatory Visit (INDEPENDENT_AMBULATORY_CARE_PROVIDER_SITE_OTHER): Payer: Self-pay | Admitting: Vascular Surgery

## 2019-10-23 ENCOUNTER — Other Ambulatory Visit: Payer: Self-pay

## 2019-10-23 DIAGNOSIS — I8311 Varicose veins of right lower extremity with inflammation: Secondary | ICD-10-CM | POA: Diagnosis not present

## 2019-10-23 DIAGNOSIS — I8312 Varicose veins of left lower extremity with inflammation: Secondary | ICD-10-CM

## 2019-10-24 ENCOUNTER — Ambulatory Visit: Payer: Medicare HMO | Admitting: Internal Medicine

## 2019-10-24 ENCOUNTER — Other Ambulatory Visit: Payer: Self-pay

## 2019-10-24 ENCOUNTER — Encounter: Payer: Self-pay | Admitting: Internal Medicine

## 2019-10-24 DIAGNOSIS — I10 Essential (primary) hypertension: Secondary | ICD-10-CM

## 2019-10-24 DIAGNOSIS — R0602 Shortness of breath: Secondary | ICD-10-CM | POA: Diagnosis not present

## 2019-10-24 DIAGNOSIS — J45909 Unspecified asthma, uncomplicated: Secondary | ICD-10-CM

## 2019-10-24 DIAGNOSIS — J0100 Acute maxillary sinusitis, unspecified: Secondary | ICD-10-CM

## 2019-10-24 MED ORDER — AZITHROMYCIN 250 MG PO TABS: ORAL_TABLET | ORAL | 0 refills | Status: DC

## 2019-10-24 NOTE — Progress Notes (Addendum)
[facility] [place], [place] [address]  Pulmonary Sleep Medicine   Office Visit Note  Patient Name: Danielle Delgado DOB: Dec 31, 1950 MRN [id number]  Date of Service: 10/25/2019  Complaints/HPI: Patient is here for routine pulmonary follow-up. She has been using her Asmanex for asthma symptoms and feels as this is managing her symptoms well. Reports she does not require her albuterol inhaler on a routine basis, only when she is sick. Her allergies remain well controlled on Zyrtec. She does report that overnight she began sneezing and today it has progressed to rhinorrhea and watery eyes. She also complains of sinus pressure and sinus headache. Wears CPAP nightly for OSA. She reports wearing his CPAP nightly with no complaints. She cleans her machine by hand once weekly with warm water and vinegar and allows her water chamber to dry each day.  ROS  General: (-) fever, (-) chills, (-) night sweats, (-) weakness Skin: (-) rashes, (-) itching,. Eyes: (-) visual changes, (-) redness, (-) itching. Nose and Sinuses: (-) nasal stuffiness or itchiness, (-) postnasal drip, (-) nosebleeds, (-) sinus trouble. Mouth and Throat: (-) sore throat, (-) hoarseness. Neck: (-) swollen glands, (-) enlarged thyroid, (-) neck pain. Respiratory: - cough, (-) bloody sputum, - shortness of breath, + wheezing. Cardiovascular: - ankle swelling, (-) chest pain. Lymphatic: (-) lymph node enlargement. Neurologic: (-) numbness, (-) tingling. Psychiatric: (-) anxiety, (-) depression   Current Medication: Outpatient Encounter Medications as of 10/24/2019  Medication Sig Note  . acetaminophen (TYLENOL) 650 MG CR tablet Take 650 mg by mouth every 8 (eight) hours as needed for pain.   Marland Kitchen albuterol (PROAIR HFA) 108 (90 Base) MCG/ACT inhaler Inhale 2 puffs into the lungs every 6 (six) hours as needed for wheezing or shortness of breath.   Marland Kitchen aspirin EC 81 MG tablet Take 81 mg by mouth daily.   Marland Kitchen  atorvastatin (LIPITOR) 10 MG tablet  01/17/2014: Received from: External Pharmacy  . Biotin 5000 MCG TABS Take 1 tablet by mouth daily.   . calcium carbonate (OS-CAL) 600 MG TABS tablet Take 600 mg by mouth 2 (two) times daily with a meal.   . cetirizine (ZYRTEC) 10 MG tablet Take 10 mg by mouth daily.   . diclofenac sodium (VOLTAREN) 1 % GEL Apply 4 g topically 4 (four) times daily.   . ferrous sulfate 325 (65 FE) MG tablet Take 325 mg by mouth daily with breakfast.   . glipiZIDE (GLUCOTROL XL) 5 MG 24 hr tablet Take 5 mg by mouth daily with breakfast.   . levothyroxine (SYNTHROID) 100 MCG tablet Take 100 mcg by mouth daily before breakfast.   . liothyronine (CYTOMEL) 5 MCG tablet Take 5 mcg by mouth daily.   Marland Kitchen losartan-hydrochlorothiazide (HYZAAR) 50-12.5 MG per tablet  01/17/2014: Received from: External Pharmacy  . magnesium oxide (MAG-OX) 400 MG tablet Take 400 mg by mouth daily.   . Melatonin 1 MG TABS Take by mouth as needed.    . metFORMIN (GLUCOPHAGE) 1000 MG tablet Take by mouth. 11/06/2014: Received from: [place]  . mometasone (ASMANEX 30 METERED DOSES) 220 MCG/INH inhaler Inhale into the lungs.   . montelukast (SINGULAIR) 10 MG tablet TAKE 1 TABLET EVERY DAY   . Omega-3 Fatty Acids (FISH OIL CONCENTRATE) 300 MG CAPS Take by mouth.  11/06/2014: Received from: [place]  . pantoprazole (PROTONIX) 40 MG tablet TAKE 1 TABLET EVERY DAY   . Turmeric Curcumin 500 MG CAPS Take by mouth.   Marland Kitchen  ACCU-CHEK AVIVA PLUS test strip  01/17/2014: Received from: External Pharmacy  . acetaminophen (TYLENOL) 500 MG tablet Take 1,000 mg by mouth every 6 (six) hours as needed.   Marland Kitchen amoxicillin-clavulanate (AUGMENTIN) 875-125 MG tablet Take 1 tablet by mouth 2 (two) times daily.   Marland Kitchen azithromycin (ZITHROMAX Z-PAK) 250 MG tablet Take two 250 mg tablets on day one followed by one 250 mg tablet each day for four days.   . benzonatate (TESSALON) 100 MG capsule Take 1 capsule  (100 mg total) by mouth 2 (two) times daily as needed for cough.   . levothyroxine (SYNTHROID) 137 MCG tablet Take 137 mcg by mouth daily before breakfast.    No facility-administered encounter medications on file as of 10/24/2019.    Surgical History: Past Surgical History:  Procedure Laterality Date  . BREAST EXCISIONAL BIOPSY Right 1996   neg  . BREAST SURGERY     biopsy  . CHOLECYSTECTOMY    . COLONOSCOPY WITH PROPOFOL N/A 10/12/2014   Procedure: COLONOSCOPY WITH PROPOFOL;  Surgeon: Manya Silvas, MD;  Location[facility] ENDOSCOPY;  Service: Endoscopy;  Laterality: N/A;  . DILATION AND CURETTAGE OF UTERUS    . ESOPHAGOGASTRODUODENOSCOPY (EGD) WITH PROPOFOL N/A 10/12/2014   Procedure: ESOPHAGOGASTRODUODENOSCOPY (EGD) WITH PROPOFOL;  Surgeon: Manya Silvas, MD;  Location: [facility] ENDOSCOPY;  Service: Endoscopy;  Laterality: N/A;  . NASAL SINUS SURGERY    . VAGINAL HYSTERECTOMY  1982    Medical History: Past Medical History:  Diagnosis Date  . Asthma   . Chronic vaginitis   . Diabetes mellitus without complication ([place])   . Dyspareunia in female   . Eczema   . FH: breast cancer   . FH: colon cancer   . GERD (gastroesophageal reflux disease)   . Hiatus hernia syndrome   . Hyperlipidemia   . Hypertension   . Hypothyroidism   . Morbid obesity ([place])   . Postmenopausal   . Sleep apnea   . Superficial varicosities   . Vaginal atrophy     Family History: Family History  Problem Relation Age of Onset  . Osteoporosis Mother   . Breast cancer Maternal Aunt 60  . Diabetes Maternal Grandmother   . Diabetes Maternal Grandfather   . Heart disease Maternal Grandfather   . Colon cancer Cousin   . Breast cancer Cousin 71       mat cousin  . Ovarian cancer Neg Hx     Social History: Social History   Socioeconomic History  . Marital status: Married    Spouse name: Not on file  . Number of children: Not on file  . Years of education: Not on file  . Highest education level:  Not on file  Occupational History  . Not on file  Tobacco Use  . Smoking status: Never Smoker  . Smokeless tobacco: Never Used  Vaping Use  . Vaping Use: Never used  Substance and Sexual Activity  . Alcohol use: No    Alcohol/week: 0.0 standard drinks  . Drug use: No  . Sexual activity: Not Currently  Other Topics Concern  . Not on file  Social History Narrative  . Not on file   Social Determinants of Health   Financial Resource Strain:   . Difficulty of Paying Living Expenses: Not on file  Food Insecurity:   . Worried About Charity fundraiser in the Last Year: Not on file  . Ran Out of Food in the Last Year: Not on file  Transportation Needs:   .  Lack of Transportation (Medical): Not on file  . Lack of Transportation (Non-Medical): Not on file  Physical Activity:   . Days of Exercise per Week: Not on file  . Minutes of Exercise per Session: Not on file  Stress:   . Feeling of Stress : Not on file  Social Connections:   . Frequency of Communication with Friends and Family: Not on file  . Frequency of Social Gatherings with Friends and Family: Not on file  . Attends Religious Services: Not on file  . Active Member of Clubs or Organizations: Not on file  . Attends Archivist Meetings: Not on file  . Marital Status: Not on file  Intimate Partner Violence:   . Fear of Current or Ex-Partner: Not on file  . Emotionally Abused: Not on file  . Physically Abused: Not on file  . Sexually Abused: Not on file    Vital Signs: Blood pressure 132/74, pulse 89, temperature (!) 97.1 F (36.2 C), resp. rate 16, height 5\' 4"  (1.626 m), weight 224 lb 9.6 oz (101.9 kg), SpO2 98 %.  Examination: General Appearance: The patient is well-developed, well-nourished, and in no distress. Skin: Gross inspection of skin unremarkable. Head: normocephalic, no gross deformities. Eyes: no gross deformities noted. ENT: ears appear grossly normal no exudates. Neck: Supple. No  thyromegaly. No LAD. Respiratory: Diminished wheezing throughout bases. Cardiovascular: Normal S1 and S2 without murmur or rub. Extremities: No cyanosis. pulses are equal. Neurologic: Alert and oriented. No involuntary movements.  LABS: No results found for this or any previous visit (from the past 2160 hour(s)).  Radiology: MM 3D SCREEN BREAST BILATERAL  Result Date: 11/29/2018 CLINICAL DATA:  Screening. EXAM: DIGITAL SCREENING BILATERAL MAMMOGRAM WITH TOMO AND CAD COMPARISON:  Previous exam(s). ACR Breast Density Category b: There are scattered areas of fibroglandular density. FINDINGS: There are no findings suspicious for malignancy. Images were processed with CAD. IMPRESSION: No mammographic evidence of malignancy. A result letter of this screening mammogram will be mailed directly to the patient. RECOMMENDATION: Screening mammogram in one year. (Code:SM-B-01Y) BI-RADS CATEGORY  1: Negative. Electronically Signed   By: Lajean Manes M.D.   On: 11/29/2018 10:53    Assessment and Plan: Patient Active Problem List   Diagnosis Date Noted  . 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 Primary osteoarthritis of left knee 12/05/2018  . Leg pain 10/19/2017  . Nocturnal leg cramps 10/19/2017  . Chronic venous insufficiency 03/02/2017  . Lymphedema 03/02/2017  . Increased BMI 07/10/2015  . Pelvic pain in female 07/10/2015  . Vaginal atrophy 07/10/2015  . Status post vaginal hysterectomy 07/10/2015    1. Acute maxillary sinusitis, recurrence not specified Sinusitis symptoms started last night, symptoms worsened today. Complete course of azithromycin, patient confident she has taken this is the past without allergic reaction. - azithromycin (ZITHROMAX Z-PAK) 250 MG tablet; Take two 250 mg tablets on day one followed by one 250 mg tablet each day for four days.  Dispense: 6 each; Refill: 0  2. Uncomplicated asthma, unspecified asthma severity,  unspecified whether persistent Stable at this time on Asmanex, spirometry stable from today. Will continue to monitor.  3. SOB (shortness of breath) - Spirometry with Graph  4. Essential hypertension BP initially elevated today, improved on recheck. Will continue on current therapy and continue to monitor in office.  General Counseling: I have discussed the findings of the evaluation and examination with Mariann Laster.  I have also discussed any further diagnostic evaluation thatmay  be needed or ordered today. Charisma verbalizes understanding of the findings of todays visit. We also reviewed her medications today and discussed drug interactions and side effects including but not limited excessive drowsiness and altered mental states. We also discussed that there is always a risk not just to her but also people around her. she has been encouraged to call the office with any questions or concerns that should arise related to todays visit.  Orders Placed This Encounter  Procedures  . Spirometry with Graph    Order Specific Question:   Where should this test be performed?    Answer:   [facility]    Order Specific Question:   Basic spirometry    Answer:   Yes    Order Specific Question:   Spirometry pre & post bronchodilator    Answer:   No     Time spent: 25  I have personally obtained a history, examined the patient, evaluated laboratory and imaging results, formulated the assessment and plan and placed orders. This patient was seen by Casey Burkitt AGNP-C in Collaboration with Dr. Devona Konig as a part of collaborative care agreement.    Allyne Gee, MD [facility] Pulmonary and Critical Care Sleep medicine

## 2019-10-25 ENCOUNTER — Telehealth (INDEPENDENT_AMBULATORY_CARE_PROVIDER_SITE_OTHER): Payer: Self-pay

## 2019-10-25 NOTE — Telephone Encounter (Signed)
Patient left voicemail asking was it okay to go to the chiropractor to have her knees and legs adjusted. The patient did have a laser ablation done on 10/19/19. I spoke with Eulogio Ditch NP and recommend for the patient to wait couple of weeks until she go to the chiropractor. Patient has being aware with medical advice and verbalized understanding.

## 2019-10-25 NOTE — Patient Instructions (Signed)
Asthma, Adult  Asthma is a long-term (chronic) condition in which the airways get tight and narrow. The airways are the breathing passages that lead from the nose and mouth down into the lungs. A person with asthma will have times when symptoms get worse. These are called asthma attacks. They can cause coughing, whistling sounds when you breathe (wheezing), shortness of breath, and chest pain. They can make it hard to breathe. There is no cure for asthma, but medicines and lifestyle changes can help control it. There are many things that can bring on an asthma attack or make asthma symptoms worse (triggers). Common triggers include:  Mold.  Dust.  Cigarette smoke.  Cockroaches.  Things that can cause allergy symptoms (allergens). These include animal skin flakes (dander) and pollen from trees or grass.  Things that pollute the air. These may include household cleaners, wood smoke, smog, or chemical odors.  Cold air, weather changes, and wind.  Crying or laughing hard.  Stress.  Certain medicines or drugs.  Certain foods such as dried fruit, potato chips, and grape juice.  Infections, such as a cold or the flu.  Certain medical conditions or diseases.  Exercise or tiring activities. Asthma may be treated with medicines and by staying away from the things that cause asthma attacks. Types of medicines may include:  Controller medicines. These help prevent asthma symptoms. They are usually taken every day.  Fast-acting reliever or rescue medicines. These quickly relieve asthma symptoms. They are used as needed and provide short-term relief.  Allergy medicines if your attacks are brought on by allergens.  Medicines to help control the body's defense (immune) system. Follow these instructions at home: Avoiding triggers in your home  Change your heating and air conditioning filter often.  Limit your use of fireplaces and wood stoves.  Get rid of pests (such as roaches and  mice) and their droppings.  Throw away plants if you see mold on them.  Clean your floors. Dust regularly. Use cleaning products that do not smell.  Have someone vacuum when you are not home. Use a vacuum cleaner with a HEPA filter if possible.  Replace carpet with wood, tile, or vinyl flooring. Carpet can trap animal skin flakes and dust.  Use allergy-proof pillows, mattress covers, and box spring covers.  Wash bed sheets and blankets every week in hot water. Dry them in a dryer.  Keep your bedroom free of any triggers.  Avoid pets and keep windows closed when things that cause allergy symptoms are in the air.  Use blankets that are made of polyester or cotton.  Clean bathrooms and kitchens with bleach. If possible, have someone repaint the walls in these rooms with mold-resistant paint. Keep out of the rooms that are being cleaned and painted.  Wash your hands often with soap and water. If soap and water are not available, use hand sanitizer.  Do not allow anyone to smoke in your home. General instructions  Take over-the-counter and prescription medicines only as told by your doctor. ? Talk with your doctor if you have questions about how or when to take your medicines. ? Make note if you need to use your medicines more often than usual.  Do not use any products that contain nicotine or tobacco, such as cigarettes and e-cigarettes. If you need help quitting, ask your doctor.  Stay away from secondhand smoke.  Avoid doing things outdoors when allergen counts are high and when air quality is low.  Wear a ski mask   when doing outdoor activities in the winter. The mask should cover your nose and mouth. Exercise indoors on cold days if you can.  Warm up before you exercise. Take time to cool down after exercise.  Use a peak flow meter as told by your doctor. A peak flow meter is a tool that measures how well the lungs are working.  Keep track of the peak flow meter's readings.  Write them down.  Follow your asthma action plan. This is a written plan for taking care of your asthma and treating your attacks.  Make sure you get all the shots (vaccines) that your doctor recommends. Ask your doctor about a flu shot and a pneumonia shot.  Keep all follow-up visits as told by your doctor. This is important. Contact a doctor if:  You have wheezing, shortness of breath, or a cough even while taking medicine to prevent attacks.  The mucus you cough up (sputum) is thicker than usual.  The mucus you cough up changes from clear or white to yellow, green, gray, or bloody.  You have problems from the medicine you are taking, such as: ? A rash. ? Itching. ? Swelling. ? Trouble breathing.  You need reliever medicines more than 2-3 times a week.  Your peak flow reading is still at 50-79% of your personal best after following the action plan for 1 hour.  You have a fever. Get help right away if:  You seem to be worse and are not responding to medicine during an asthma attack.  You are short of breath even at rest.  You get short of breath when doing very little activity.  You have trouble eating, drinking, or talking.  You have chest pain or tightness.  You have a fast heartbeat.  Your lips or fingernails start to turn blue.  You are Busch-headed or dizzy, or you faint.  Your peak flow is less than 50% of your personal best.  You feel too tired to breathe normally. Summary  Asthma is a long-term (chronic) condition in which the airways get tight and narrow. An asthma attack can make it hard to breathe.  Asthma cannot be cured, but medicines and lifestyle changes can help control it.  Make sure you understand how to avoid triggers and how and when to use your medicines. This information is not intended to replace advice given to you by your health care provider. Make sure you discuss any questions you have with your health care provider. Document Revised:  04/21/2018 Document Reviewed: 03/23/2016 Elsevier Patient Education  2020 [employer]  

## 2019-10-29 NOTE — Addendum Note (Signed)
Addended by: Devona Konig on: 10/29/2019 01:11 PM   Modules accepted: Level of Service

## 2019-11-02 ENCOUNTER — Encounter (INDEPENDENT_AMBULATORY_CARE_PROVIDER_SITE_OTHER): Payer: Self-pay | Admitting: Vascular Surgery

## 2019-11-02 ENCOUNTER — Other Ambulatory Visit: Payer: Self-pay

## 2019-11-02 ENCOUNTER — Ambulatory Visit (INDEPENDENT_AMBULATORY_CARE_PROVIDER_SITE_OTHER): Payer: Medicare HMO | Admitting: Vascular Surgery

## 2019-11-02 DIAGNOSIS — I83819 Varicose veins of unspecified lower extremities with pain: Secondary | ICD-10-CM

## 2019-11-02 DIAGNOSIS — I83812 Varicose veins of left lower extremities with pain: Secondary | ICD-10-CM

## 2019-11-02 NOTE — Progress Notes (Signed)
    MRN : [id number]  Danielle Delgado is a 69 y.o. (04/17/1950) female who presents with chief complaint of painful varicose veins.    The patient's left lower extremity was sterilely prepped and draped.  The ultrasound machine was used to visualize the left great saphenous vein throughout its course.  A segment above the knee was selected for access.  The saphenous vein was accessed without difficulty using ultrasound guidance with a micropuncture needle.   An 0.018  wire was placed beyond the saphenofemoral junction through the sheath and the microneedle was removed.  The 65 cm sheath was then placed over the wire and the wire and dilator were removed.  The laser fiber was placed through the sheath and its tip was placed approximately 2 cm below the saphenofemoral junction.  Tumescent anesthesia was then created with a dilute lidocaine solution.  Laser energy was then delivered with constant withdrawal of the sheath and laser fiber.  Approximately 793 Joules of energy were delivered over a length of 19 cm.  Sterile dressings were placed.  The patient tolerated the procedure well without complications.

## 2019-11-07 ENCOUNTER — Other Ambulatory Visit (INDEPENDENT_AMBULATORY_CARE_PROVIDER_SITE_OTHER): Payer: Self-pay | Admitting: Vascular Surgery

## 2019-11-07 ENCOUNTER — Ambulatory Visit (INDEPENDENT_AMBULATORY_CARE_PROVIDER_SITE_OTHER): Payer: Medicare HMO

## 2019-11-07 ENCOUNTER — Other Ambulatory Visit: Payer: Self-pay

## 2019-11-07 DIAGNOSIS — I83819 Varicose veins of unspecified lower extremities with pain: Secondary | ICD-10-CM | POA: Diagnosis not present

## 2019-11-17 ENCOUNTER — Telehealth (INDEPENDENT_AMBULATORY_CARE_PROVIDER_SITE_OTHER): Payer: Self-pay | Admitting: Vascular Surgery

## 2019-11-17 NOTE — Telephone Encounter (Signed)
On 9/2 the pt had a  done on her left leg by Dr. Delana Meyer she was to F/U a week after the procedure her next follow up is scheduled on 9/30 last night the pt was rubbing voltern  gel on left leg and noticed egg sized lump on the front of the left leg below the knee she wore compression stocking most of the day and didn't notice it   The color is normal not hot to the touch the knot is swollen and feels tight she is not having any trouble walking  And pt isn't having any numbness in the area. She would like to come in to be seen  Please advise.

## 2019-11-17 NOTE — Telephone Encounter (Signed)
She say when she presses on the lump it does hurt an she would like to be seen today

## 2019-11-17 NOTE — Telephone Encounter (Signed)
Called stating that she noticed an egg-shaped knot under her knee on her left leg last night. She states it is hard and she is very concerned. She would like to come in to be seen. Patient recently lasers done on the left leg 11-02-19 and the [place] laser ablation 11-07-19. Please advise.

## 2019-11-17 NOTE — Telephone Encounter (Signed)
Because the patient can feel the area it is likely not a DVT.  DVTs cannot be felt through the skin.  Because there is no pain, numbness, discoloration or difficulty with walking, this is not something that strikes me as anything that is emergent or limb threatening.  The patient has an appointment in a little over a week.  I think it is reasonable to keep that appointment but we can see if there is an available appointment sooner.

## 2019-11-17 NOTE — Telephone Encounter (Signed)
I called the pt and  Made her aware that Per the NP if she feel that's her leg is getting worse over the weekend she should go to the ED in the mean time we are going to try to get her scheduled to be seen sooner that her already scheduled appointment on the 63 th .

## 2019-11-29 ENCOUNTER — Ambulatory Visit
Admission: RE | Admit: 2019-11-29 | Discharge: 2019-11-29 | Disposition: A | Discharge status: Home / Self Care | Payer: Medicare HMO | Source: Ambulatory Visit | Attending: Obstetrics and Gynecology | Admitting: Obstetrics and Gynecology

## 2019-11-29 ENCOUNTER — Other Ambulatory Visit: Payer: Self-pay

## 2019-11-29 DIAGNOSIS — Z1231 Encounter for screening mammogram for malignant neoplasm of breast: Secondary | ICD-10-CM | POA: Insufficient documentation

## 2019-11-29 DIAGNOSIS — Z6841 Body Mass Index (BMI) 40.0 and over, adult: Secondary | ICD-10-CM | POA: Insufficient documentation

## 2019-11-30 ENCOUNTER — Encounter (INDEPENDENT_AMBULATORY_CARE_PROVIDER_SITE_OTHER): Payer: Self-pay | Admitting: Vascular Surgery

## 2019-11-30 ENCOUNTER — Ambulatory Visit (INDEPENDENT_AMBULATORY_CARE_PROVIDER_SITE_OTHER): Payer: Medicare HMO | Admitting: Vascular Surgery

## 2019-11-30 VITALS — BP 148/77 | HR 66 | Resp 16 | Wt 214.8 lb

## 2019-11-30 DIAGNOSIS — G4762 Sleep related leg cramps: Secondary | ICD-10-CM

## 2019-11-30 DIAGNOSIS — I872 Venous insufficiency (chronic) (peripheral): Secondary | ICD-10-CM

## 2019-11-30 DIAGNOSIS — I83819 Varicose veins of unspecified lower extremities with pain: Secondary | ICD-10-CM

## 2019-11-30 DIAGNOSIS — M1712 Unilateral primary osteoarthritis, left knee: Secondary | ICD-10-CM

## 2019-12-01 ENCOUNTER — Encounter (INDEPENDENT_AMBULATORY_CARE_PROVIDER_SITE_OTHER): Payer: Self-pay | Admitting: Vascular Surgery

## 2019-12-01 NOTE — Progress Notes (Signed)
MRN : [id number]  Danielle Delgado is a 69 y.o. (09-Jul-1950) female who presents with chief complaint of  Chief Complaint  Patient presents with  . Follow-up    post laser follow up  .  History of Present Illness:   The patient returns to the office for followup status post laser ablation of the right great saphenous saphenous vein on October 19, 2019 and the left great saphenous vein on November 02, 2019.  The patient note significant improvement in the lower extremity pain but not resolution of the symptoms. The patient notes multiple residual varicosities bilaterally which continued to hurt with dependent positions and remained tender to palpation. The patient's swelling is minimally from preoperative status. The patient continues to wear graduated compression stockings on a daily basis but these are not eliminating the pain and discomfort. The patient continues to use over-the-counter anti-inflammatory medications to treat the pain and related symptoms but this has not given the patient relief. The patient notes the pain in the lower extremities is causing problems with daily exercise, problems at work and even with household activities such as preparing meals and doing dishes.  The patient is otherwise done well and there have been no complications related to the laser procedure or interval changes in the patient's overall   Post laser ultrasound shows successful ablation of the great saphenous veins bilaterally   Current Meds  Medication Sig  . ACCU-CHEK AVIVA PLUS test strip   . acetaminophen (TYLENOL) 500 MG tablet Take 1,000 mg by mouth every 6 (six) hours as needed.  Marland Kitchen acetaminophen (TYLENOL) 650 MG CR tablet Take 650 mg by mouth every 8 (eight) hours as needed for pain.  Marland Kitchen albuterol (PROAIR HFA) 108 (90 Base) MCG/ACT inhaler Inhale 2 puffs into the lungs every 6 (six) hours as needed for wheezing or shortness of breath.  Marland Kitchen aspirin EC 81 MG tablet Take 81 mg by mouth daily.  Marland Kitchen  atorvastatin (LIPITOR) 10 MG tablet   . Biotin 5000 MCG TABS Take 1 tablet by mouth daily.  . calcium carbonate (OS-CAL) 600 MG TABS tablet Take 600 mg by mouth 2 (two) times daily with a meal.  . cetirizine (ZYRTEC) 10 MG tablet Take 10 mg by mouth daily.  . diclofenac sodium (VOLTAREN) 1 % GEL Apply 4 g topically 4 (four) times daily.  . ferrous sulfate 325 (65 FE) MG tablet Take 325 mg by mouth daily with breakfast.  . glipiZIDE (GLUCOTROL XL) 5 MG 24 hr tablet Take 5 mg by mouth daily with breakfast.  . levothyroxine (SYNTHROID) 88 MCG tablet Take 88 mcg by mouth daily before breakfast.   . liothyronine (CYTOMEL) 5 MCG tablet Take 5 mcg by mouth daily.  Marland Kitchen losartan-hydrochlorothiazide (HYZAAR) 50-12.5 MG per tablet   . magnesium oxide (MAG-OX) 400 MG tablet Take 400 mg by mouth daily.  . Melatonin 1 MG TABS Take by mouth as needed.   . metFORMIN (GLUCOPHAGE) 1000 MG tablet Take by mouth.  . mometasone (ASMANEX 30 METERED DOSES) 220 MCG/INH inhaler Inhale into the lungs.  . montelukast (SINGULAIR) 10 MG tablet TAKE 1 TABLET EVERY DAY  . Omega-3 Fatty Acids (FISH OIL CONCENTRATE) 300 MG CAPS Take by mouth.   . pantoprazole (PROTONIX) 40 MG tablet TAKE 1 TABLET EVERY DAY  . Turmeric Curcumin 500 MG CAPS Take by mouth.    Past Medical History:  Diagnosis Date  . Asthma   . Chronic vaginitis   . Diabetes mellitus without complication ([place])   .  Dyspareunia in female   . Eczema   . FH: breast cancer   . FH: colon cancer   . GERD (gastroesophageal reflux disease)   . Hiatus hernia syndrome   . Hyperlipidemia   . Hypertension   . Hypothyroidism   . Morbid obesity ([place])   . Postmenopausal   . Sleep apnea   . Superficial varicosities   . Vaginal atrophy     Past Surgical History:  Procedure Laterality Date  . BREAST EXCISIONAL BIOPSY Right 1996   neg  . BREAST SURGERY     biopsy  . CHOLECYSTECTOMY    . COLONOSCOPY WITH PROPOFOL N/A 10/12/2014   Procedure: COLONOSCOPY WITH  PROPOFOL;  Surgeon: Manya Silvas, MD;  Location[facility] ENDOSCOPY;  Service: Endoscopy;  Laterality: N/A;  . DILATION AND CURETTAGE OF UTERUS    . ESOPHAGOGASTRODUODENOSCOPY (EGD) WITH PROPOFOL N/A 10/12/2014   Procedure: ESOPHAGOGASTRODUODENOSCOPY (EGD) WITH PROPOFOL;  Surgeon: Manya Silvas, MD;  Location: [facility] ENDOSCOPY;  Service: Endoscopy;  Laterality: N/A;  . NASAL SINUS SURGERY    . VAGINAL HYSTERECTOMY  1982    Social History Social History   Tobacco Use  . Smoking status: Never Smoker  . Smokeless tobacco: Never Used  Vaping Use  . Vaping Use: Never used  Substance Use Topics  . Alcohol use: No    Alcohol/week: 0.0 standard drinks  . Drug use: No    Family History Family History  Problem Relation Age of Onset  . Osteoporosis Mother   . Breast cancer Maternal Aunt 60  . Diabetes Maternal Grandmother   . Diabetes Maternal Grandfather   . Heart disease Maternal Grandfather   . Colon cancer Cousin   . Breast cancer Cousin 37       mat cousin  . Ovarian cancer Neg Hx     Allergies  Allergen Reactions  . Biaxin [Clarithromycin]   . Propulsid [Cisapride] Hives  . Sulfa Antibiotics Hives     REVIEW OF SYSTEMS (Negative unless checked)  Constitutional: [] Weight loss  [] Fever  [] Chills Cardiac: [] Chest pain   [] Chest pressure   [] Palpitations   [] Shortness of breath when laying flat   [] Shortness of breath with exertion. Vascular:  [] Pain in legs with walking   [x] Pain in legs at rest  [] History of DVT   [] Phlebitis   [x] Swelling in legs   [x] Varicose veins   [] Non-healing ulcers Pulmonary:   [] Uses home oxygen   [] Productive cough   [] Hemoptysis   [] Wheeze  [] COPD   [] Asthma Neurologic:  [] Dizziness   [] Seizures   [] History of stroke   [] History of TIA  [] Aphasia   [] Vissual changes   [] Weakness or numbness in arm   [] Weakness or numbness in leg Musculoskeletal:   [] Joint swelling   [x] Joint pain   [] Low back pain Hematologic:  [] Easy bruising  [] Easy bleeding    [] Hypercoagulable state   [] Anemic Gastrointestinal:  [] Diarrhea   [] Vomiting  [x] Gastroesophageal reflux/heartburn   [] Difficulty swallowing. Genitourinary:  [] Chronic kidney disease   [] Difficult urination  [] Frequent urination   [] Blood in urine Skin:  [] Rashes   [] Ulcers  Psychological:  [] History of anxiety   []  History of major depression.  Physical Examination  Vitals:   11/30/19 1524  BP: (!) 148/77  Pulse: 66  Resp: 16  Weight: 214 lb 12.8 oz (97.4 kg)   Body mass index is 36.87 kg/m. Gen: WD/WN, NAD Head: [place]/AT, No temporalis wasting.  Ear/Nose/Throat: Hearing grossly intact, nares w/o erythema or drainage Eyes: PER,  EOMI, sclera nonicteric.  Neck: Supple, no large masses.   Pulmonary:  Good air movement, no audible wheezing bilaterally, no use of accessory muscles.  Cardiac: RRR, no JVD Vascular: Large varicosities present extensively greater than 10 mm bilaterally.  Mild venous stasis changes to the legs bilaterally.  2+ soft pitting edema Vessel Right Left  Radial Palpable Palpable  Gastrointestinal: Non-distended. No guarding/no peritoneal signs.  Musculoskeletal: M/S 5/5 throughout.  No deformity or atrophy.  Neurologic: CN 2-12 intact. Symmetrical.  Speech is fluent. Motor exam as listed above. Psychiatric: Judgment intact, Mood & affect appropriate for pt's clinical situation. Dermatologic: Mild rashes no ulcers noted.  No changes consistent with cellulitis.  CBC Lab Results  Component Value Date   WBC 11.4 (H) 11/06/2014   HGB 11.8 (L) 11/06/2014   HCT 35.5 11/06/2014   MCV 89.3 11/06/2014   PLT 284 11/06/2014    BMET No results found for: NA, K, CL, CO2, GLUCOSE, BUN, CREATININE, CALCIUM, GFRNONAA, GFRAA CrCl cannot be calculated (No successful lab value found.).  COAG No results found for: INR, PROTIME  Radiology MM 3D SCREEN BREAST BILATERAL  Result Date: 11/29/2019 CLINICAL DATA:  Screening. EXAM: DIGITAL SCREENING BILATERAL MAMMOGRAM WITH  TOMO AND CAD COMPARISON:  Previous exam(s). ACR Breast Density Category b: There are scattered areas of fibroglandular density. FINDINGS: There are no findings suspicious for malignancy. Images were processed with CAD. IMPRESSION: No mammographic evidence of malignancy. A result letter of this screening mammogram will be mailed directly to the patient. RECOMMENDATION: Screening mammogram in one year. (Code:SM-B-01Y) BI-RADS CATEGORY  1: Negative. Electronically Signed   By: Ammie Ferrier M.D.   On: 11/29/2019 13:53   VAS [place] LOWER EXTREMITY VENOUS POST ABLATION  Result Date: 11/09/2019  Lower Venous Reflux Study Indications: Varicosities. Other Indications: Right GSV ablation on 10/19/19; Left GSV ablation on 11/02/19. Performing Technologist: Blondell Reveal RT, RDMS, RVT  Examination Guidelines: A complete evaluation includes B-mode imaging, spectral Doppler, color Doppler, and power Doppler as needed of all accessible portions of each vessel. Bilateral testing is considered an integral part of a complete examination. Limited examinations for reoccurring indications may be performed as noted. The reflux portion of the exam is performed with the patient in reverse Trendelenburg. Significant venous reflux is defined as >500 ms in the superficial venous system, and >1 second in the deep venous system.  Summary: Left: - No evidence of deep vein thrombosis seen in the left lower extremity, from the common femoral through the popliteal veins. - Following ablation, the GSV appears occluded from the mid thigh region to roughly 1.5cm from the SFJ.  *See table(s) above for measurements and observations. Electronically signed by Hortencia Pilar MD on 11/09/2019 at 12:45:15 PM.    Final      Assessment/Plan 1. Varicose veins with pain Recommend:  The patient has had successful ablation of the previously incompetent saphenous venous system but still has persistent symptoms of pain and swelling that are having a  negative impact on daily life and daily activities.  Patient should undergo injection sclerotherapy to treat the residual varicosities.  The risks, benefits and alternative therapies were reviewed in detail with the patient.  All questions were answered.  The patient agrees to proceed with sclerotherapy at their convenience.  The patient will continue wearing the graduated compression stockings and using the over-the-counter pain medications to treat her symptoms.    2. Chronic venous insufficiency No surgery or intervention at this point in time.    I  have had a long discussion with the patient regarding venous insufficiency and why it  causes symptoms. I have discussed with the patient the chronic skin changes that accompany venous insufficiency and the long term sequela such as infection and ulceration.  Patient will begin wearing graduated compression stockings class 1 (20-30 mmHg) or compression wraps on a daily basis a prescription was given. The patient will put the stockings on first thing in the morning and removing them in the evening. The patient is instructed specifically not to sleep in the stockings.    In addition, behavioral modification including several periods of elevation of the lower extremities during the day will be continued. I have demonstrated that proper elevation is a position with the ankles at heart level.  The patient is instructed to begin routine exercise, especially walking on a daily basis   3. Primary osteoarthritis of left knee Continue NSAID medications as already ordered, these medications have been reviewed and there are no changes at this time.  Continued activity and therapy was stressed.   4. Nocturnal leg cramps Recommend:  The patient is describing Charley horse type leg cramps. No invasive studies, angiography or surgery at this time.    I have reviewed homeopathic remedies such as Cider vinegar or mustard; placing a bar of soap at the  bottom of the bed. Quinine is also an option Magnesium supplementation at bedtime was also reviewed.  The patient should continue walking and begin a more formal exercise program.  The patient should continue antiplatelet therapy and aggressive treatment of the lipid abnormalities  The patient should continue wearing graduated compression socks 10-15 mmHg strength to control any mild edema.    Hortencia Pilar, MD  12/01/2019 8:29 AM

## 2020-01-24 ENCOUNTER — Ambulatory Visit (INDEPENDENT_AMBULATORY_CARE_PROVIDER_SITE_OTHER): Payer: Medicare HMO | Admitting: Vascular Surgery

## 2020-01-30 ENCOUNTER — Ambulatory Visit: Payer: Medicare HMO | Admitting: Internal Medicine

## 2020-01-30 ENCOUNTER — Other Ambulatory Visit: Payer: Self-pay

## 2020-01-30 ENCOUNTER — Encounter: Payer: Self-pay | Admitting: Internal Medicine

## 2020-01-30 VITALS — BP 128/70 | HR 88 | Temp 97.9°F | Resp 16 | Ht 64.0 in | Wt 193.0 lb

## 2020-01-30 DIAGNOSIS — J4520 Mild intermittent asthma, uncomplicated: Secondary | ICD-10-CM | POA: Diagnosis not present

## 2020-01-30 DIAGNOSIS — R0602 Shortness of breath: Secondary | ICD-10-CM

## 2020-01-30 MED ORDER — PANTOPRAZOLE SODIUM 40 MG PO TBEC
40.0000 mg | DELAYED_RELEASE_TABLET | Freq: Every day | ORAL | 1 refills | Status: DC
Start: 2020-01-30 — End: 2020-07-04

## 2020-01-30 MED ORDER — MONTELUKAST SODIUM 10 MG PO TABS
10.0000 mg | ORAL_TABLET | Freq: Every day | ORAL | 1 refills | Status: DC
Start: 2020-01-30 — End: 2020-07-04

## 2020-01-30 NOTE — Progress Notes (Signed)
[facility] [place], [place] [address]  Pulmonary Sleep Medicine   Office Visit Note  Patient Name: Danielle Delgado DOB: [birth year] MRN [id number]  Date of Service: 01/30/2020  Complaints/HPI: Doing well with weight loss. She has lost 50 pounds. Basically doing it with diet control at this time.  She is doing fine as far as her asthma is concerned has not had any exacerbations or flareups noted.  She does use inhalers as needed.  ROS  General: (-) fever, (-) chills, (-) night sweats, (-) weakness Skin: (-) rashes, (-) itching,. Eyes: (-) visual changes, (-) redness, (-) itching. Nose and Sinuses: (-) nasal stuffiness or itchiness, (-) postnasal drip, (-) nosebleeds, (-) sinus trouble. Mouth and Throat: (-) sore throat, (-) hoarseness. Neck: (-) swollen glands, (-) enlarged thyroid, (-) neck pain. Respiratory: - cough, (-) bloody sputum, - shortness of breath, - wheezing. Cardiovascular: - ankle swelling, (-) chest pain. Lymphatic: (-) lymph node enlargement. Neurologic: (-) numbness, (-) tingling. Psychiatric: (-) anxiety, (-) depression   Current Medication: Outpatient Encounter Medications as of 01/30/2020  Medication Sig Note  . ACCU-CHEK AVIVA PLUS test strip  01/17/2014: Received from: External Pharmacy  . acetaminophen (TYLENOL) 500 MG tablet Take 1,000 mg by mouth every 6 (six) hours as needed.   Marland Kitchen acetaminophen (TYLENOL) 650 MG CR tablet Take 650 mg by mouth every 8 (eight) hours as needed for pain.   Marland Kitchen albuterol (PROAIR HFA) 108 (90 Base) MCG/ACT inhaler Inhale 2 puffs into the lungs every 6 (six) hours as needed for wheezing or shortness of breath.   Marland Kitchen aspirin EC 81 MG tablet Take 81 mg by mouth daily.   Marland Kitchen atorvastatin (LIPITOR) 10 MG tablet  01/17/2014: Received from: External Pharmacy  . Biotin 5000 MCG TABS Take 1 tablet by mouth daily.   . calcium carbonate (OS-CAL) 600 MG TABS tablet Take 600 mg by mouth 2 (two) times daily with a meal.    . cetirizine (ZYRTEC) 10 MG tablet Take 10 mg by mouth daily.   . diclofenac sodium (VOLTAREN) 1 % GEL Apply 4 g topically 4 (four) times daily.   . ferrous sulfate 325 (65 FE) MG tablet Take 325 mg by mouth daily with breakfast.   . glipiZIDE (GLUCOTROL XL) 5 MG 24 hr tablet Take 5 mg by mouth daily with breakfast.   . levothyroxine (SYNTHROID) 88 MCG tablet Take 88 mcg by mouth daily before breakfast.    . liothyronine (CYTOMEL) 5 MCG tablet Take 5 mcg by mouth daily.   Marland Kitchen losartan-hydrochlorothiazide (HYZAAR) 50-12.5 MG per tablet  01/17/2014: Received from: External Pharmacy  . magnesium oxide (MAG-OX) 400 MG tablet Take 400 mg by mouth daily.   . Melatonin 1 MG TABS Take by mouth as needed.    . metFORMIN (GLUCOPHAGE) 1000 MG tablet Take by mouth. 11/06/2014: Received from: [place]  . mometasone (ASMANEX 30 METERED DOSES) 220 MCG/INH inhaler Inhale into the lungs.   . montelukast (SINGULAIR) 10 MG tablet TAKE 1 TABLET EVERY DAY   . Omega-3 Fatty Acids (FISH OIL CONCENTRATE) 300 MG CAPS Take by mouth.  11/06/2014: Received from: [place]  . pantoprazole (PROTONIX) 40 MG tablet Take 1 tablet (40 mg total) by mouth daily.   . Turmeric Curcumin 500 MG CAPS Take by mouth.   Marland Kitchen amoxicillin-clavulanate (AUGMENTIN) 875-125 MG tablet Take 1 tablet by mouth 2 (two) times daily.   Marland Kitchen azithromycin (ZITHROMAX Z-PAK) 250 MG tablet Take two 250  mg tablets on day one followed by one 250 mg tablet each day for four days.   . benzonatate (TESSALON) 100 MG capsule Take 1 capsule (100 mg total) by mouth 2 (two) times daily as needed for cough.   . levothyroxine (SYNTHROID) 137 MCG tablet Take 137 mcg by mouth daily before breakfast.    No facility-administered encounter medications on file as of 01/30/2020.    Surgical History: Past Surgical History:  Procedure Laterality Date  . BREAST EXCISIONAL BIOPSY Right 1996   neg  . BREAST SURGERY     biopsy  .  CHOLECYSTECTOMY    . COLONOSCOPY WITH PROPOFOL N/A 10/12/2014   Procedure: COLONOSCOPY WITH PROPOFOL;  Surgeon: Manya Silvas, MD;  Location: [facility] ENDOSCOPY;  Service: Endoscopy;  Laterality: N/A;  . DILATION AND CURETTAGE OF UTERUS    . ESOPHAGOGASTRODUODENOSCOPY (EGD) WITH PROPOFOL N/A 10/12/2014   Procedure: ESOPHAGOGASTRODUODENOSCOPY (EGD) WITH PROPOFOL;  Surgeon: Manya Silvas, MD;  Location: [facility] ENDOSCOPY;  Service: Endoscopy;  Laterality: N/A;  . NASAL SINUS SURGERY    . VAGINAL HYSTERECTOMY  1982    Medical History: Past Medical History:  Diagnosis Date  . Asthma   . Chronic vaginitis   . Diabetes mellitus without complication ([place])   . Dyspareunia in female   . Eczema   . FH: breast cancer   . FH: colon cancer   . GERD (gastroesophageal reflux disease)   . Hiatus hernia syndrome   . Hyperlipidemia   . Hypertension   . Hypothyroidism   . Morbid obesity ([place])   . Postmenopausal   . Sleep apnea   . Superficial varicosities   . Vaginal atrophy     Family History: Family History  Problem Relation Age of Onset  . Osteoporosis Mother   . Breast cancer Maternal Aunt 60  . Diabetes Maternal Grandmother   . Diabetes Maternal Grandfather   . Heart disease Maternal Grandfather   . Colon cancer Cousin   . Breast cancer Cousin 43       mat cousin  . Ovarian cancer Neg Hx     Social History: Social History   Socioeconomic History  . Marital status: Married    Spouse name: Not on file  . Number of children: Not on file  . Years of education: Not on file  . Highest education level: Not on file  Occupational History  . Not on file  Tobacco Use  . Smoking status: Never Smoker  . Smokeless tobacco: Never Used  Vaping Use  . Vaping Use: Never used  Substance and Sexual Activity  . Alcohol use: No    Alcohol/week: 0.0 standard drinks  . Drug use: No  . Sexual activity: Not Currently  Other Topics Concern  . Not on file  Social History Narrative  . Not on  file   Social Determinants of Health   Financial Resource Strain:   . Difficulty of Paying Living Expenses: Not on file  Food Insecurity:   . Worried About Charity fundraiser in the Last Year: Not on file  . Ran Out of Food in the Last Year: Not on file  Transportation Needs:   . Lack of Transportation (Medical): Not on file  . Lack of Transportation (Non-Medical): Not on file  Physical Activity:   . Days of Exercise per Week: Not on file  . Minutes of Exercise per Session: Not on file  Stress:   . Feeling of Stress : Not on file  Social Connections:   .  Frequency of Communication with Friends and Family: Not on file  . Frequency of Social Gatherings with Friends and Family: Not on file  . Attends Religious Services: Not on file  . Active Member of Clubs or Organizations: Not on file  . Attends Archivist Meetings: Not on file  . Marital Status: Not on file  Intimate Partner Violence:   . Fear of Current or Ex-Partner: Not on file  . Emotionally Abused: Not on file  . Physically Abused: Not on file  . Sexually Abused: Not on file    Vital Signs: Blood pressure 128/70, pulse 88, temperature 97.9 F (36.6 C), resp. rate 16, height 5\' 4"  (1.626 m), weight 193 lb (87.5 kg), SpO2 98 %.  Examination: General Appearance: The patient is well-developed, well-nourished, and in no distress. Skin: Gross inspection of skin unremarkable. Head: normocephalic, no gross deformities. Eyes: no gross deformities noted. ENT: ears appear grossly normal no exudates. Neck: Supple. No thyromegaly. No LAD. Respiratory: no rhonchi noted. Cardiovascular: Normal S1 and S2 without murmur or rub. Extremities: No cyanosis. pulses are equal. Neurologic: Alert and oriented. No involuntary movements.  LABS: No results found for this or any previous visit (from the past 2160 hour(s)).  Radiology: MM 3D SCREEN BREAST BILATERAL  Result Date: 11/29/2019 CLINICAL DATA:  Screening. EXAM:  DIGITAL SCREENING BILATERAL MAMMOGRAM WITH TOMO AND CAD COMPARISON:  Previous exam(s). ACR Breast Density Category b: There are scattered areas of fibroglandular density. FINDINGS: There are no findings suspicious for malignancy. Images were processed with CAD. IMPRESSION: No mammographic evidence of malignancy. A result letter of this screening mammogram will be mailed directly to the patient. RECOMMENDATION: Screening mammogram in one year. (Code:SM-B-01Y) BI-RADS CATEGORY  1: Negative. Electronically Signed   By: Ammie Ferrier M.D.   On: 11/29/2019 13:53    No results found.  No results found.    Assessment and Plan: Patient Active Problem List   Diagnosis Date Noted  . Varicose veins with pain 11/02/2019  . 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 Primary osteoarthritis of left knee 12/05/2018  . Leg pain 10/19/2017  . Nocturnal leg cramps 10/19/2017  . Chronic venous insufficiency 03/02/2017  . Lymphedema 03/02/2017  . Increased BMI 07/10/2015  . Pelvic pain in female 07/10/2015  . Vaginal atrophy 07/10/2015  . Status post vaginal hysterectomy 07/10/2015    1. Asthma controlled at this time. Arlyce Harman reviewed and she is doing well we will continue with supportive care inhalers as necessary. 2. Obesity doing well with weight loss she will continue with her dietary management that she is doing her BMI is at 33.2 3. HTN controlled will monitor with PCP  General Counseling: I have discussed the findings of the evaluation and examination with Mariann Laster.  I have also discussed any further diagnostic evaluation thatmay be needed or ordered today. Roselinda verbalizes understanding of the findings of todays visit. We also reviewed her medications today and discussed drug interactions and side effects including but not limited excessive drowsiness and altered mental states. We also discussed that there is always a risk not just to her but also people  around her. she has been encouraged to call the office with any questions or concerns that should arise related to todays visit.  Orders Placed This Encounter  Procedures  . Spirometry with Graph    Order Specific Question:   Where should this test be performed?    Answer:   Other  Time spent: 15  I have personally obtained a history, examined the patient, evaluated laboratory and imaging results, formulated the assessment and plan and placed orders.    Allyne Gee, MD [facility] Pulmonary and Critical Care Sleep medicine

## 2020-01-30 NOTE — Patient Instructions (Signed)
Asthma, Adult  Asthma is a long-term (chronic) condition in which the airways get tight and narrow. The airways are the breathing passages that lead from the nose and mouth down into the lungs. A person with asthma will have times when symptoms get worse. These are called asthma attacks. They can cause coughing, whistling sounds when you breathe (wheezing), shortness of breath, and chest pain. They can make it hard to breathe. There is no cure for asthma, but medicines and lifestyle changes can help control it. There are many things that can bring on an asthma attack or make asthma symptoms worse (triggers). Common triggers include:  Mold.  Dust.  Cigarette smoke.  Cockroaches.  Things that can cause allergy symptoms (allergens). These include animal skin flakes (dander) and pollen from trees or grass.  Things that pollute the air. These may include household cleaners, wood smoke, smog, or chemical odors.  Cold air, weather changes, and wind.  Crying or laughing hard.  Stress.  Certain medicines or drugs.  Certain foods such as dried fruit, potato chips, and grape juice.  Infections, such as a cold or the flu.  Certain medical conditions or diseases.  Exercise or tiring activities. Asthma may be treated with medicines and by staying away from the things that cause asthma attacks. Types of medicines may include:  Controller medicines. These help prevent asthma symptoms. They are usually taken every day.  Fast-acting reliever or rescue medicines. These quickly relieve asthma symptoms. They are used as needed and provide short-term relief.  Allergy medicines if your attacks are brought on by allergens.  Medicines to help control the body's defense (immune) system. Follow these instructions at home: Avoiding triggers in your home  Change your heating and air conditioning filter often.  Limit your use of fireplaces and wood stoves.  Get rid of pests (such as roaches and  mice) and their droppings.  Throw away plants if you see mold on them.  Clean your floors. Dust regularly. Use cleaning products that do not smell.  Have someone vacuum when you are not home. Use a vacuum cleaner with a HEPA filter if possible.  Replace carpet with wood, tile, or vinyl flooring. Carpet can trap animal skin flakes and dust.  Use allergy-proof pillows, mattress covers, and box spring covers.  Wash bed sheets and blankets every week in hot water. Dry them in a dryer.  Keep your bedroom free of any triggers.  Avoid pets and keep windows closed when things that cause allergy symptoms are in the air.  Use blankets that are made of polyester or cotton.  Clean bathrooms and kitchens with bleach. If possible, have someone repaint the walls in these rooms with mold-resistant paint. Keep out of the rooms that are being cleaned and painted.  Wash your hands often with soap and water. If soap and water are not available, use hand sanitizer.  Do not allow anyone to smoke in your home. General instructions  Take over-the-counter and prescription medicines only as told by your doctor. ? Talk with your doctor if you have questions about how or when to take your medicines. ? Make note if you need to use your medicines more often than usual.  Do not use any products that contain nicotine or tobacco, such as cigarettes and e-cigarettes. If you need help quitting, ask your doctor.  Stay away from secondhand smoke.  Avoid doing things outdoors when allergen counts are high and when air quality is low.  Wear a ski mask   when doing outdoor activities in the winter. The mask should cover your nose and mouth. Exercise indoors on cold days if you can.  Warm up before you exercise. Take time to cool down after exercise.  Use a peak flow meter as told by your doctor. A peak flow meter is a tool that measures how well the lungs are working.  Keep track of the peak flow meter's readings.  Write them down.  Follow your asthma action plan. This is a written plan for taking care of your asthma and treating your attacks.  Make sure you get all the shots (vaccines) that your doctor recommends. Ask your doctor about a flu shot and a pneumonia shot.  Keep all follow-up visits as told by your doctor. This is important. Contact a doctor if:  You have wheezing, shortness of breath, or a cough even while taking medicine to prevent attacks.  The mucus you cough up (sputum) is thicker than usual.  The mucus you cough up changes from clear or white to yellow, green, gray, or bloody.  You have problems from the medicine you are taking, such as: ? A rash. ? Itching. ? Swelling. ? Trouble breathing.  You need reliever medicines more than 2-3 times a week.  Your peak flow reading is still at 50-79% of your personal best after following the action plan for 1 hour.  You have a fever. Get help right away if:  You seem to be worse and are not responding to medicine during an asthma attack.  You are short of breath even at rest.  You get short of breath when doing very little activity.  You have trouble eating, drinking, or talking.  You have chest pain or tightness.  You have a fast heartbeat.  Your lips or fingernails start to turn blue.  You are [place]-headed or dizzy, or you faint.  Your peak flow is less than 50% of your personal best.  You feel too tired to breathe normally. Summary  Asthma is a long-term (chronic) condition in which the airways get tight and narrow. An asthma attack can make it hard to breathe.  Asthma cannot be cured, but medicines and lifestyle changes can help control it.  Make sure you understand how to avoid triggers and how and when to use your medicines. This information is not intended to replace advice given to you by your health care provider. Make sure you discuss any questions you have with your health care provider. Document Revised:  04/21/2018 Document Reviewed: 03/23/2016 Elsevier Patient Education  2020 [employer] Inc[employer]  

## 2020-01-31 ENCOUNTER — Encounter (INDEPENDENT_AMBULATORY_CARE_PROVIDER_SITE_OTHER): Payer: Self-pay | Admitting: Vascular Surgery

## 2020-01-31 ENCOUNTER — Ambulatory Visit (INDEPENDENT_AMBULATORY_CARE_PROVIDER_SITE_OTHER): Payer: Medicare HMO | Admitting: Vascular Surgery

## 2020-01-31 VITALS — BP 149/80 | HR 74 | Resp 16 | Wt 193.4 lb

## 2020-01-31 DIAGNOSIS — I83819 Varicose veins of unspecified lower extremities with pain: Secondary | ICD-10-CM

## 2020-01-31 DIAGNOSIS — I83813 Varicose veins of bilateral lower extremities with pain: Secondary | ICD-10-CM | POA: Diagnosis not present

## 2020-01-31 NOTE — Progress Notes (Signed)
Varicose veins of bilateral  lower extremity with inflammation (454.1  I83.10) Current Plans   Indication: Patient presents with symptomatic varicose veins of the bilateral  lower extremity.   Procedure: Sclerotherapy using hypertonic saline mixed with 1% Lidocaine was performed on the bilateral lower extremity. Compression wraps were placed. The patient tolerated the procedure well. 

## 2020-02-06 NOTE — Progress Notes (Signed)

## 2020-02-14 ENCOUNTER — Ambulatory Visit (INDEPENDENT_AMBULATORY_CARE_PROVIDER_SITE_OTHER): Payer: Medicare HMO | Admitting: Vascular Surgery

## 2020-02-14 ENCOUNTER — Encounter (INDEPENDENT_AMBULATORY_CARE_PROVIDER_SITE_OTHER): Payer: Self-pay | Admitting: Vascular Surgery

## 2020-02-14 ENCOUNTER — Other Ambulatory Visit: Payer: Self-pay

## 2020-02-14 VITALS — BP 151/78 | HR 71 | Resp 16 | Wt 187.2 lb

## 2020-02-14 DIAGNOSIS — I83813 Varicose veins of bilateral lower extremities with pain: Secondary | ICD-10-CM | POA: Diagnosis not present

## 2020-02-14 DIAGNOSIS — I83819 Varicose veins of unspecified lower extremities with pain: Secondary | ICD-10-CM

## 2020-02-14 DIAGNOSIS — I872 Venous insufficiency (chronic) (peripheral): Secondary | ICD-10-CM

## 2020-02-14 NOTE — Progress Notes (Signed)
Varicose veins of bilateral  lower extremity with inflammation (454.1  I83.10) Current Plans   Indication: Patient presents with symptomatic varicose veins of the bilateral  lower extremity.   Procedure: Sclerotherapy using hypertonic saline mixed with 1% Lidocaine was performed on the bilateral lower extremity. Compression wraps were placed. The patient tolerated the procedure well. 

## 2020-02-19 ENCOUNTER — Encounter: Payer: Self-pay | Admitting: Podiatry

## 2020-02-19 ENCOUNTER — Other Ambulatory Visit: Payer: Self-pay

## 2020-02-19 ENCOUNTER — Ambulatory Visit: Payer: Medicare HMO | Admitting: Podiatry

## 2020-02-19 DIAGNOSIS — B351 Tinea unguium: Secondary | ICD-10-CM

## 2020-02-19 DIAGNOSIS — E119 Type 2 diabetes mellitus without complications: Secondary | ICD-10-CM

## 2020-02-19 DIAGNOSIS — D2372 Other benign neoplasm of skin of left lower limb, including hip: Secondary | ICD-10-CM | POA: Diagnosis not present

## 2020-02-19 DIAGNOSIS — M79676 Pain in unspecified toe(s): Secondary | ICD-10-CM | POA: Diagnosis not present

## 2020-02-19 NOTE — Progress Notes (Signed)
Danielle Delgado presents today stating that she has been going to the vascular doctors to help with her veins in her legs.  She also states that she has some tenderness around a nail which was traumatized right hallux.  She also has a painful reactive hyperkeratotic lesion that she points to the lateral aspect of her left foot around the fifth metatarsal head.  She states that that is exquisitely sore.  Objective: Vital signs are stable she is alert and oriented x3 pulses are strongly palpable capillary fill time is immediate.  There is no erythema edema cellulitis drainage or odor neurologic sensorium is intact per Semmes Weinstein monofilament.  Deep tendon reflexes are intact muscle strength is normal and symmetrical bilateral.  Toenails are in good condition sharply incurvated particularly the hallux.  She also has a benign hyperkeratotic lesion lateral aspect of the fifth met head no open wounds or lesions.  Assessment: Diabetes with currently no complications.  Benign skin lesion left foot elongated nails.  Plan: Debrided nails bilateral.  Debrided benign skin lesion.  Follow-up with her in 6 months

## 2020-02-21 ENCOUNTER — Other Ambulatory Visit: Payer: Self-pay

## 2020-02-21 ENCOUNTER — Other Ambulatory Visit: Payer: Medicare HMO

## 2020-02-21 DIAGNOSIS — Z20822 Contact with and (suspected) exposure to covid-19: Secondary | ICD-10-CM

## 2020-02-22 LAB — SARS-COV-2, NAA 2 DAY TAT

## 2020-02-22 LAB — NOVEL CORONAVIRUS, NAA: SARS-CoV-2, NAA: NOT DETECTED

## 2020-03-06 ENCOUNTER — Other Ambulatory Visit: Payer: Self-pay

## 2020-03-06 ENCOUNTER — Ambulatory Visit (INDEPENDENT_AMBULATORY_CARE_PROVIDER_SITE_OTHER): Payer: Medicare HMO | Admitting: Vascular Surgery

## 2020-03-06 ENCOUNTER — Encounter (INDEPENDENT_AMBULATORY_CARE_PROVIDER_SITE_OTHER): Payer: Self-pay | Admitting: Vascular Surgery

## 2020-03-06 VITALS — BP 134/83 | HR 73 | Ht 64.0 in | Wt 181.0 lb

## 2020-03-06 DIAGNOSIS — I872 Venous insufficiency (chronic) (peripheral): Secondary | ICD-10-CM

## 2020-03-06 NOTE — Progress Notes (Signed)
Varicose veins of bilateral  lower extremity with inflammation (454.1  I83.10) Current Plans   Indication: Patient presents with symptomatic varicose veins of the bilateral  lower extremity.   Procedure: Sclerotherapy using hypertonic saline mixed with 1% Lidocaine was performed on the bilateral lower extremity. Compression wraps were placed. The patient tolerated the procedure well. 

## 2020-04-10 ENCOUNTER — Encounter (INDEPENDENT_AMBULATORY_CARE_PROVIDER_SITE_OTHER): Payer: Self-pay | Admitting: Vascular Surgery

## 2020-04-10 ENCOUNTER — Other Ambulatory Visit: Payer: Self-pay

## 2020-04-10 ENCOUNTER — Ambulatory Visit (INDEPENDENT_AMBULATORY_CARE_PROVIDER_SITE_OTHER): Payer: Medicare HMO | Admitting: Vascular Surgery

## 2020-04-10 VITALS — BP 156/90 | HR 67 | Resp 16 | Wt 173.6 lb

## 2020-04-10 DIAGNOSIS — I872 Venous insufficiency (chronic) (peripheral): Secondary | ICD-10-CM

## 2020-04-10 DIAGNOSIS — I83813 Varicose veins of bilateral lower extremities with pain: Secondary | ICD-10-CM | POA: Diagnosis not present

## 2020-04-10 NOTE — Progress Notes (Signed)
Varicose veins of bilateral  lower extremity with inflammation (454.1  I83.10) Current Plans   Indication: Patient presents with symptomatic varicose veins of the bilateral  lower extremity.   Procedure: Sclerotherapy using hypertonic saline mixed with 1% Lidocaine was performed on the bilateral lower extremity. Compression wraps were placed. The patient tolerated the procedure well. 

## 2020-05-08 ENCOUNTER — Ambulatory Visit (INDEPENDENT_AMBULATORY_CARE_PROVIDER_SITE_OTHER): Payer: Medicare HMO | Admitting: Vascular Surgery

## 2020-05-15 ENCOUNTER — Other Ambulatory Visit: Payer: Self-pay

## 2020-05-15 ENCOUNTER — Encounter (INDEPENDENT_AMBULATORY_CARE_PROVIDER_SITE_OTHER): Payer: Self-pay | Admitting: Vascular Surgery

## 2020-05-15 ENCOUNTER — Ambulatory Visit (INDEPENDENT_AMBULATORY_CARE_PROVIDER_SITE_OTHER): Payer: Medicare HMO | Admitting: Vascular Surgery

## 2020-05-15 VITALS — BP 150/83 | HR 69 | Resp 16 | Wt 167.6 lb

## 2020-05-15 DIAGNOSIS — I83819 Varicose veins of unspecified lower extremities with pain: Secondary | ICD-10-CM | POA: Diagnosis not present

## 2020-05-15 DIAGNOSIS — I83813 Varicose veins of bilateral lower extremities with pain: Secondary | ICD-10-CM

## 2020-05-15 DIAGNOSIS — I872 Venous insufficiency (chronic) (peripheral): Secondary | ICD-10-CM | POA: Diagnosis not present

## 2020-05-15 NOTE — Progress Notes (Signed)
Varicose veins of bilateral  lower extremity with inflammation (454.1  I83.10) Current Plans   Indication: Patient presents with symptomatic varicose veins of the bilateral  lower extremity.   Procedure: Sclerotherapy using hypertonic saline mixed with 1% Lidocaine was performed on the bilateral lower extremity. Compression wraps were placed. The patient tolerated the procedure well. 

## 2020-07-04 ENCOUNTER — Other Ambulatory Visit: Payer: Self-pay | Admitting: Internal Medicine

## 2020-07-30 ENCOUNTER — Encounter: Payer: Self-pay | Admitting: Internal Medicine

## 2020-07-30 ENCOUNTER — Ambulatory Visit: Payer: Medicare HMO | Admitting: Internal Medicine

## 2020-07-30 ENCOUNTER — Other Ambulatory Visit: Payer: Self-pay

## 2020-07-30 VITALS — BP 136/80 | HR 81 | Temp 98.0°F | Resp 16 | Ht 64.0 in | Wt 176.2 lb

## 2020-07-30 DIAGNOSIS — Z9989 Dependence on other enabling machines and devices: Secondary | ICD-10-CM | POA: Diagnosis not present

## 2020-07-30 DIAGNOSIS — J4520 Mild intermittent asthma, uncomplicated: Secondary | ICD-10-CM

## 2020-07-30 DIAGNOSIS — G4733 Obstructive sleep apnea (adult) (pediatric): Secondary | ICD-10-CM

## 2020-07-30 DIAGNOSIS — R0602 Shortness of breath: Secondary | ICD-10-CM

## 2020-07-30 NOTE — Progress Notes (Signed)
[facility], [place] [address]  Pulmonary Sleep Medicine   Office Visit Note  Patient Name: Danielle Delgado DOB: [birth year] MRN [id number]  Date of Service: 07/30/2020  Complaints/HPI: OSA. Asthma under control. Obesity doing well with weight loss seems to be slightly up from before.  She has noted a little bit shortness of breath with weight gain.  Denies having any cough no congestion.  She is overall doing very well medications have been reviewed  ROS  General: (-) fever, (-) chills, (-) night sweats, (-) weakness Skin: (-) rashes, (-) itching,. Eyes: (-) visual changes, (-) redness, (-) itching. Nose and Sinuses: (-) nasal stuffiness or itchiness, (-) postnasal drip, (-) nosebleeds, (-) sinus trouble. Mouth and Throat: (-) sore throat, (-) hoarseness. Neck: (-) swollen glands, (-) enlarged thyroid, (-) neck pain. Respiratory: - cough, (-) bloody sputum, - shortness of breath, - wheezing. Cardiovascular: - ankle swelling, (-) chest pain. Lymphatic: (-) lymph node enlargement. Neurologic: (-) numbness, (-) tingling. Psychiatric: (-) anxiety, (-) depression   Current Medication: Outpatient Encounter Medications as of 07/30/2020  Medication Sig Note   ACCU-CHEK AVIVA PLUS test strip  01/17/2014: Received from: External Pharmacy   acetaminophen (TYLENOL) 500 MG tablet Take 1,000 mg by mouth every 6 (six) hours as needed.    acetaminophen (TYLENOL) 650 MG CR tablet Take 650 mg by mouth every 8 (eight) hours as needed for pain.    albuterol (PROAIR HFA) 108 (90 Base) MCG/ACT inhaler Inhale 2 puffs into the lungs every 6 (six) hours as needed for wheezing or shortness of breath.    aspirin EC 81 MG tablet Take 81 mg by mouth daily.    atorvastatin (LIPITOR) 10 MG tablet  01/17/2014: Received from: External Pharmacy   benzonatate (TESSALON) 100 MG capsule Take 1 capsule (100 mg total) by mouth 2 (two) times daily as needed for cough.    Biotin 5000  MCG TABS Take 1 tablet by mouth daily.    calcium carbonate (OS-CAL) 600 MG TABS tablet Take 600 mg by mouth 2 (two) times daily with a meal.    cetirizine (ZYRTEC) 10 MG tablet Take 10 mg by mouth daily.    diclofenac sodium (VOLTAREN) 1 % GEL Apply 4 g topically 4 (four) times daily.    ferrous sulfate 325 (65 FE) MG tablet Take 325 mg by mouth daily with breakfast.    glipiZIDE (GLUCOTROL XL) 5 MG 24 hr tablet Take 5 mg by mouth daily with breakfast.    levothyroxine (SYNTHROID) 88 MCG tablet Take 88 mcg by mouth daily before breakfast.     liothyronine (CYTOMEL) 5 MCG tablet Take 5 mcg by mouth daily.    losartan-hydrochlorothiazide (HYZAAR) 50-12.5 MG per tablet  01/17/2014: Received from: External Pharmacy   magnesium oxide (MAG-OX) 400 MG tablet Take 400 mg by mouth daily.    Melatonin 1 MG TABS Take by mouth as needed.     metFORMIN (GLUCOPHAGE) 1000 MG tablet Take by mouth. 11/06/2014: Received from: [place]   mometasone [facility]) 220 MCG/INH inhaler Inhale into the lungs.    montelukast (SINGULAIR) 10 MG tablet TAKE 1 TABLET EVERY DAY    Omega-3 Fatty Acids (FISH OIL CONCENTRATE) 300 MG CAPS Take by mouth.  11/06/2014: Received from: [place]   pantoprazole (PROTONIX) 40 MG tablet TAKE 1 TABLET EVERY DAY    Turmeric Curcumin 500 MG CAPS Take by mouth.    [DISCONTINUED] amoxicillin-clavulanate (AUGMENTIN) 875-125 MG tablet Take  1 tablet by mouth 2 (two) times daily.    [DISCONTINUED] azithromycin (ZITHROMAX Z-PAK) 250 MG tablet Take two 250 mg tablets on day one followed by one 250 mg tablet each day for four days.    [DISCONTINUED] levothyroxine (SYNTHROID) 137 MCG tablet Take 137 mcg by mouth daily before breakfast.    No facility-administered encounter medications on file as of 07/30/2020.    Surgical History: Past Surgical History:  Procedure Laterality Date   BREAST EXCISIONAL BIOPSY Right 1996   neg   BREAST SURGERY     biopsy    CHOLECYSTECTOMY     COLONOSCOPY WITH PROPOFOL N/A 10/12/2014   Procedure: COLONOSCOPY WITH PROPOFOL;  Surgeon: Manya Silvas, MD;  Location: [facility] ENDOSCOPY;  Service: Endoscopy;  Laterality: N/A;   DILATION AND CURETTAGE OF UTERUS     ESOPHAGOGASTRODUODENOSCOPY (EGD) WITH PROPOFOL N/A 10/12/2014   Procedure: ESOPHAGOGASTRODUODENOSCOPY (EGD) WITH PROPOFOL;  Surgeon: Manya Silvas, MD;  Location: [facility] ENDOSCOPY;  Service: Endoscopy;  Laterality: N/A;   NASAL SINUS SURGERY     VAGINAL HYSTERECTOMY  1982    Medical History: Past Medical History:  Diagnosis Date   Asthma    Chronic vaginitis    Diabetes mellitus without complication (HCC)    Dyspareunia in female    Eczema    FH: breast cancer    FH: colon cancer    GERD (gastroesophageal reflux disease)    Hiatus hernia syndrome    Hyperlipidemia    Hypertension    Hypothyroidism    Morbid obesity ([place])    Postmenopausal    Sleep apnea    Superficial varicosities    Vaginal atrophy     Family History: Family History  Problem Relation Age of Onset   Osteoporosis Mother    Breast cancer Maternal Aunt 60   Diabetes Maternal Grandmother    Diabetes Maternal Grandfather    Heart disease Maternal Grandfather    Colon cancer Cousin    Breast cancer Cousin 64       mat cousin   Ovarian cancer Neg Hx     Social History: Social History   Socioeconomic History   Marital status: Married    Spouse name: Not on file   Number of children: Not on file   Years of education: Not on file   Highest education level: Not on file  Occupational History   Not on file  Tobacco Use   Smoking status: Never Smoker   Smokeless tobacco: Never Used  Vaping Use   Vaping Use: Never used  Substance and Sexual Activity   Alcohol use: No    Alcohol/week: 0.0 standard drinks   Drug use: No   Sexual activity: Not Currently  Other Topics Concern   Not on file  Social History Narrative   Not on file   Social Determinants of Health    Financial Resource Strain: Not on file  Food Insecurity: Not on file  Transportation Needs: Not on file  Physical Activity: Not on file  Stress: Not on file  Social Connections: Not on file  Intimate Partner Violence: Not on file    Vital Signs: Blood pressure 136/80, pulse 81, temperature 98 F (36.7 C), resp. rate 16, height 5\' 4"  (1.626 m), weight 176 lb 3.2 oz (79.9 kg), SpO2 98 %.  Examination: General Appearance: The patient is well-developed, well-nourished, and in no distress. Skin: Gross inspection of skin unremarkable. Head: normocephalic, no gross deformities. Eyes: no gross deformities noted. ENT: ears appear grossly normal  no exudates. Neck: Supple. No thyromegaly. No LAD. Respiratory: no rhonchi noted. Cardiovascular: Normal S1 and S2 without murmur or rub. Extremities: No cyanosis. pulses are equal. Neurologic: Alert and oriented. No involuntary movements.  LABS: No results found for this or any previous visit (from the past 2160 hour(s)).  Radiology: MM 3D SCREEN BREAST BILATERAL  Result Date: 11/29/2019 CLINICAL DATA:  Screening. EXAM: DIGITAL SCREENING BILATERAL MAMMOGRAM WITH TOMO AND CAD COMPARISON:  Previous exam(s). ACR Breast Density Category b: There are scattered areas of fibroglandular density. FINDINGS: There are no findings suspicious for malignancy. Images were processed with CAD. IMPRESSION: No mammographic evidence of malignancy. A result letter of this screening mammogram will be mailed directly to the patient. RECOMMENDATION: Screening mammogram in one year. (Code:SM-B-01Y) BI-RADS CATEGORY  1: Negative. Electronically Signed   By: Ammie Ferrier M.D.   On: 11/29/2019 13:53    No results found.  No results found.    Assessment and Plan: Patient Active Problem List   Diagnosis Date Noted   Varicose veins with pain 11/02/2019   Intractable chronic migraine without aura and without status migrainosus 04/26/2019   Primary osteoarthritis  of left knee 12/05/2018   Leg pain 10/19/2017   Nocturnal leg cramps 10/19/2017   Chronic venous insufficiency 03/02/2017   Lymphedema 03/02/2017   Increased BMI 07/10/2015   Pelvic pain in female 07/10/2015   Vaginal atrophy 07/10/2015   Status post vaginal hysterectomy 07/10/2015    1. SOB (shortness of breath)  - Spirometry with Graph  2. OSA on CPAP Being seen at [facility] neurology  3. Chronic asthma, mild intermittent, uncomplicated Under good control continue with current medical management with inhalers.  Will be took care of her prescription renewals as needed.  General Counseling: I have discussed the findings of the evaluation and examination with Mariann Laster.  I have also discussed any further diagnostic evaluation thatmay be needed or ordered today. Kendre verbalizes understanding of the findings of todays visit. We also reviewed her medications today and discussed drug interactions and side effects including but not limited excessive drowsiness and altered mental states. We also discussed that there is always a risk not just to her but also people around her. she has been encouraged to call the office with any questions or concerns that should arise related to todays visit.  Orders Placed This Encounter  Procedures   Spirometry with Graph    Order Specific Question:   Where should this test be performed?    Answer:   [facility]    Order Specific Question:   Basic spirometry    Answer:   Yes    Order Specific Question:   Spirometry pre & post bronchodilator    Answer:   No     Time spent: 40  I have personally obtained a history, examined the patient, evaluated laboratory and imaging results, formulated the assessment and plan and placed orders.    Allyne Gee, MD [facility] Pulmonary and Critical Care Sleep medicine

## 2020-07-30 NOTE — Patient Instructions (Signed)
Sleep Apnea Sleep apnea affects breathing during sleep. It causes breathing to stop for a short time or to become shallow. It can also increase the risk of:  Heart attack.  Stroke.  Being very overweight (obese).  Diabetes.  Heart failure.  Irregular heartbeat. The goal of treatment is to help you breathe normally again. What are the causes? There are three kinds of sleep apnea:  Obstructive sleep apnea. This is caused by a blocked or collapsed airway.  Central sleep apnea. This happens when the brain does not send the right signals to the muscles that control breathing.  Mixed sleep apnea. This is a combination of obstructive and central sleep apnea. The most common cause of this condition is a collapsed or blocked airway. This can happen if:  Your throat muscles are too relaxed.  Your tongue and tonsils are too large.  You are overweight.  Your airway is too small.   What increases the risk?  Being overweight.  Smoking.  Having a small airway.  Being older.  Being female.  Drinking alcohol.  Taking medicines to calm yourself (sedatives or tranquilizers).  Having family members with the condition. What are the signs or symptoms?  Trouble staying asleep.  Being sleepy or tired during the day.  Getting angry a lot.  Loud snoring.  Headaches in the morning.  Not being able to focus your mind (concentrate).  Forgetting things.  Less interest in sex.  Mood swings.  Personality changes.  Feelings of sadness (depression).  Waking up a lot during the night to pee (urinate).  Dry mouth.  Sore throat. How is this diagnosed?  Your medical history.  A physical exam.  A test that is done when you are sleeping (sleep study). The test is most often done in a sleep lab but may also be done at home. How is this treated?  Sleeping on your side.  Using a medicine to get rid of mucus in your nose (decongestant).  Avoiding the use of alcohol,  medicines to help you relax, or certain pain medicines (narcotics).  Losing weight, if needed.  Changing your diet.  Not smoking.  Using a machine to open your airway while you sleep, such as: ? An oral appliance. This is a mouthpiece that shifts your lower jaw forward. ? A CPAP device. This device blows air through a mask when you breathe out (exhale). ? An EPAP device. This has valves that you put in each nostril. ? A BPAP device. This device blows air through a mask when you breathe in (inhale) and breathe out.  Having surgery if other treatments do not work. It is important to get treatment for sleep apnea. Without treatment, it can lead to:  High blood pressure.  Coronary artery disease.  In men, not being able to have an erection (impotence).  Reduced thinking ability.   Follow these instructions at home: Lifestyle  Make changes that your doctor recommends.  Eat a healthy diet.  Lose weight if needed.  Avoid alcohol, medicines to help you relax, and some pain medicines.  Do not use any products that contain nicotine or tobacco, such as cigarettes, e-cigarettes, and chewing tobacco. If you need help quitting, ask your doctor. General instructions  Take over-the-counter and prescription medicines only as told by your doctor.  If you were given a machine to use while you sleep, use it only as told by your doctor.  If you are having surgery, make sure to tell your doctor you have   sleep apnea. You may need to bring your device with you.  Keep all follow-up visits as told by your doctor. This is important. Contact a doctor if:  The machine that you were given to use during sleep bothers you or does not seem to be working.  You do not get better.  You get worse. Get help right away if:  Your chest hurts.  You have trouble breathing in enough air.  You have an uncomfortable feeling in your back, arms, or stomach.  You have trouble talking.  One side of your  body feels weak.  A part of your face is hanging down. These symptoms may be an emergency. Do not wait to see if the symptoms will go away. Get medical help right away. Call your local emergency services (911 in the [place] Do not drive yourself to the hospital. Summary  This condition affects breathing during sleep.  The most common cause is a collapsed or blocked airway.  The goal of treatment is to help you breathe normally while you sleep. This information is not intended to replace advice given to you by your health care provider. Make sure you discuss any questions you have with your health care provider. Document Revised: 12/03/2017 Document Reviewed: 10/12/2017 Elsevier Patient Education  2021 [place].   Asthma, Adult  Asthma is a long-term (chronic) condition in which the airways get tight and narrow. The airways are the breathing passages that lead from the nose and mouth down into the lungs. A person with asthma will have times when symptoms get worse. These are called asthma attacks. They can cause coughing, whistling sounds when you breathe (wheezing), shortness of breath, and chest pain. They can make it hard to breathe. There is no cure for asthma, but medicines and lifestyle changes can help control it. There are many things that can bring on an asthma attack or make asthma symptoms worse (triggers). Common triggers include:  Mold.  Dust.  Cigarette smoke.  Cockroaches.  Things that can cause allergy symptoms (allergens). These include animal skin flakes (dander) and pollen from trees or grass.  Things that pollute the air. These may include household cleaners, wood smoke, smog, or chemical odors.  Cold air, weather changes, and wind.  Crying or laughing hard.  Stress.  Certain medicines or drugs.  Certain foods such as dried fruit, potato chips, and grape juice.  Infections, such as a cold or the flu.  Certain medical conditions or  diseases.  Exercise or tiring activities. Asthma may be treated with medicines and by staying away from the things that cause asthma attacks. Types of medicines may include:  Controller medicines. These help prevent asthma symptoms. They are usually taken every day.  Fast-acting reliever or rescue medicines. These quickly relieve asthma symptoms. They are used as needed and provide short-term relief.  Allergy medicines if your attacks are brought on by allergens.  Medicines to help control the body's defense (immune) system. Follow these instructions at home: Avoiding triggers in your home  Change your heating and air conditioning filter often.  Limit your use of fireplaces and wood stoves.  Get rid of pests (such as roaches and mice) and their droppings.  Throw away plants if you see mold on them.  Clean your floors. Dust regularly. Use cleaning products that do not smell.  Have someone vacuum when you are not home. Use a vacuum cleaner with a HEPA filter if possible.  Replace carpet with wood, tile, or vinyl flooring.  Carpet can trap animal skin flakes and dust.  Use allergy-proof pillows, mattress covers, and box spring covers.  Wash bed sheets and blankets every week in hot water. Dry them in a dryer.  Keep your bedroom free of any triggers.  Avoid pets and keep windows closed when things that cause allergy symptoms are in the air.  Use blankets that are made of polyester or cotton.  Clean bathrooms and kitchens with bleach. If possible, have someone repaint the walls in these rooms with mold-resistant paint. Keep out of the rooms that are being cleaned and painted.  Wash your hands often with soap and water. If soap and water are not available, use hand sanitizer.  Do not allow anyone to smoke in your home. General instructions  Take over-the-counter and prescription medicines only as told by your doctor. ? Talk with your doctor if you have questions about how or  when to take your medicines. ? Make note if you need to use your medicines more often than usual.  Do not use any products that contain nicotine or tobacco, such as cigarettes and e-cigarettes. If you need help quitting, ask your doctor.  Stay away from secondhand smoke.  Avoid doing things outdoors when allergen counts are high and when air quality is low.  Wear a ski mask when doing outdoor activities in the winter. The mask should cover your nose and mouth. Exercise indoors on cold days if you can.  Warm up before you exercise. Take time to cool down after exercise.  Use a peak flow meter as told by your doctor. A peak flow meter is a tool that measures how well the lungs are working.  Keep track of the peak flow meter's readings. Write them down.  Follow your asthma action plan. This is a written plan for taking care of your asthma and treating your attacks.  Make sure you get all the shots (vaccines) that your doctor recommends. Ask your doctor about a flu shot and a pneumonia shot.  Keep all follow-up visits as told by your doctor. This is important. Contact a doctor if:  You have wheezing, shortness of breath, or a cough even while taking medicine to prevent attacks.  The mucus you cough up (sputum) is thicker than usual.  The mucus you cough up changes from clear or white to yellow, green, gray, or bloody.  You have problems from the medicine you are taking, such as: ? A rash. ? Itching. ? Swelling. ? Trouble breathing.  You need reliever medicines more than 2-3 times a week.  Your peak flow reading is still at 50-79% of your personal best after following the action plan for 1 hour.  You have a fever. Get help right away if:  You seem to be worse and are not responding to medicine during an asthma attack.  You are short of breath even at rest.  You get short of breath when doing very little activity.  You have trouble eating, drinking, or talking.  You have  chest pain or tightness.  You have a fast heartbeat.  Your lips or fingernails start to turn blue.  You are [place]ie[place]-headed or dizzy, or you faint.  Your peak flow is less than 50% of your personal best.  You feel too tired to breathe normally. Summary  Asthma is a long-term (chronic) condition in which the airways get tight and narrow. An asthma attack can make it hard to breathe.  Asthma cannot be cured, but medicines and  lifestyle changes can help control it.  Make sure you understand how to avoid triggers and how and when to use your medicines. This information is not intended to replace advice given to you by your health care provider. Make sure you discuss any questions you have with your health care provider. Document Revised: 06/21/2019 Document Reviewed: 06/21/2019 Elsevier Patient Education  2021 [employer].

## 2020-08-19 ENCOUNTER — Other Ambulatory Visit: Payer: Self-pay

## 2020-08-19 ENCOUNTER — Encounter: Payer: Self-pay | Admitting: Podiatry

## 2020-08-19 ENCOUNTER — Ambulatory Visit: Payer: Medicare HMO | Admitting: Podiatry

## 2020-08-19 DIAGNOSIS — E119 Type 2 diabetes mellitus without complications: Secondary | ICD-10-CM

## 2020-08-19 NOTE — Progress Notes (Signed)
She presents today for her diabetic foot exam.  She does demonstrate time no new complications.  Objective: Vital signs are stable she is alert and oriented x3 pulses are slightly diminished 1/4 capillary fill time is a bit sluggish but all in all intact.  Feet are warm to the touch.  Mild flexible hammertoe deformities are noted no osseous abnormalities are identified other than that she also has no open lesions or wounds noted.  No preulcerative lesions.  Assessment: Diabetes mellitus without complications hammertoe deformities are present and some early vascular changes.  Plan: Regarding request of new diabetic shoes and she will follow-up with me in 6 months

## 2020-11-13 ENCOUNTER — Other Ambulatory Visit: Payer: Self-pay

## 2020-11-13 ENCOUNTER — Ambulatory Visit (INDEPENDENT_AMBULATORY_CARE_PROVIDER_SITE_OTHER): Payer: Medicare HMO

## 2020-11-13 DIAGNOSIS — M2040 Other hammer toe(s) (acquired), unspecified foot: Secondary | ICD-10-CM

## 2020-11-13 DIAGNOSIS — E119 Type 2 diabetes mellitus without complications: Secondary | ICD-10-CM

## 2020-11-13 NOTE — Progress Notes (Signed)
Patient presents to the office today for diabetic shoe and insole measuring.  Patient was measured with brannock device to determine size and width for 1 pair of extra depth shoes and foam casted for 3 pair of insoles.   Documentation of medical necessity will be sent to patient's treating diabetic doctor to verify and sign.   Patient's diabetic provider: Dr. Laurence Slate and insoles will be ordered at that time and patient will be notified for an appointment for fitting when they arrive.   Shoe size (per patient): 7.5   Brannock measurement: rt: 7 D/E - 7.5 D, lt: 6 D - 7 C/D  Patient shoe selection-   1st choice:   A200W  2nd choice:  Paige Boot A450W  Shoe size ordered: 7.5 Wide

## 2020-12-31 ENCOUNTER — Ambulatory Visit (INDEPENDENT_AMBULATORY_CARE_PROVIDER_SITE_OTHER): Payer: Medicare HMO | Admitting: Obstetrics and Gynecology

## 2020-12-31 ENCOUNTER — Encounter: Payer: Self-pay | Admitting: Obstetrics and Gynecology

## 2020-12-31 ENCOUNTER — Other Ambulatory Visit: Payer: Self-pay

## 2020-12-31 VITALS — BP 138/80 | HR 74 | Resp 16 | Ht 63.0 in | Wt 206.3 lb

## 2020-12-31 DIAGNOSIS — Z01419 Encounter for gynecological examination (general) (routine) without abnormal findings: Secondary | ICD-10-CM

## 2020-12-31 NOTE — Progress Notes (Signed)
HPI:      [name] is a 70 y.o. G1P0101 who LMP was No LMP recorded. Patient has had a hysterectomy.  Subjective:   She presents today for her annual examination.  She has no complaints.  She has previously had a hysterectomy and no longer gets Pap smears.  She is due for a mammogram and gets them yearly.   All of her blood work is through her PCP. She had a colonoscopy 6 years ago and is interested in doing Cologuard this year.    Hx: The following portions of the patient's history were reviewed and updated as appropriate:             She  has a past medical history of Asthma, Chronic vaginitis, Diabetes mellitus without complication ([place]), Dyspareunia in female, Eczema, FH: breast cancer, FH: colon cancer, GERD (gastroesophageal reflux disease), Hiatus hernia syndrome, Hyperlipidemia, Hypertension, Hypothyroidism, Morbid obesity ([place]), Postmenopausal, Sleep apnea, Superficial varicosities, and Vaginal atrophy. She does not have any pertinent problems on file. She  has a past surgical history that includes Cholecystectomy; Colonoscopy with propofol (N/A, 10/12/2014); Esophagogastroduodenoscopy (egd) with propofol (N/A, 10/12/2014); Nasal sinus surgery; Breast surgery; Vaginal hysterectomy (1982); Dilation and curettage of uterus; and Breast excisional biopsy (Right, 1996). Her family history includes Breast cancer (age of onset: 71) in her cousin; Breast cancer (age of onset: 60) in her maternal aunt; Colon cancer in her cousin; Diabetes in her maternal grandfather and maternal grandmother; Heart disease in her maternal grandfather; Osteoporosis in her mother. She  reports that she has never smoked. She has never used smokeless tobacco. She reports that she does not drink alcohol and does not use drugs. She has a current medication list which includes the following prescription(s): accu-chek aviva plus, acetaminophen, albuterol, aspirin ec, atorvastatin, biotin, calcium carbonate, cetirizine,  ferrous sulfate, glipizide, levothyroxine sodium, liothyronine, losartan-hydrochlorothiazide, melatonin, metformin, mometasone, montelukast, fish oil concentrate, and turmeric curcumin. She is allergic to cisapride, sulfa antibiotics, and biaxin [clarithromycin].       Review of Systems:  Review of Systems  Constitutional: Denied constitutional symptoms, night sweats, recent illness, fatigue, fever, insomnia and weight loss.  Eyes: Denied eye symptoms, eye pain, photophobia, vision change and visual disturbance.  Ears/Nose/Throat/Neck: Denied ear, nose, throat or neck symptoms, hearing loss, nasal discharge, sinus congestion and sore throat.  Cardiovascular: Denied cardiovascular symptoms, arrhythmia, chest pain/pressure, edema, exercise intolerance, orthopnea and palpitations.  Respiratory: Denied pulmonary symptoms, asthma, pleuritic pain, productive sputum, cough, dyspnea and wheezing.  Gastrointestinal: Denied, gastro-esophageal reflux, melena, nausea and vomiting.  Genitourinary: Denied genitourinary symptoms including symptomatic vaginal discharge, pelvic relaxation issues, and urinary complaints.  Musculoskeletal: Denied musculoskeletal symptoms, stiffness, swelling, muscle weakness and myalgia.  Dermatologic: Denied dermatology symptoms, rash and scar.  Neurologic: Denied neurology symptoms, dizziness, headache, neck pain and syncope.  Psychiatric: Denied psychiatric symptoms, anxiety and depression.  Endocrine: Denied endocrine symptoms including hot flashes and night sweats.   Meds:   Current Outpatient Medications on File Prior to Visit  Medication Sig Dispense Refill   ACCU-CHEK AVIVA PLUS test strip      acetaminophen (TYLENOL) 650 MG CR tablet Take 650 mg by mouth every 8 (eight) hours as needed for pain.     albuterol (PROAIR HFA) 108 (90 Base) MCG/ACT inhaler Inhale 2 puffs into the lungs every 6 (six) hours as needed for wheezing or shortness of breath. 8 g 0   aspirin EC  81 MG tablet Take 81 mg by mouth daily.  atorvastatin (LIPITOR) 10 MG tablet      Biotin 5000 MCG TABS Take 1 tablet by mouth daily.     calcium carbonate (OS-CAL) 600 MG TABS tablet Take 600 mg by mouth 2 (two) times daily with a meal.     cetirizine (ZYRTEC) 10 MG tablet Take 10 mg by mouth daily.     ferrous sulfate 325 (65 FE) MG tablet Take 325 mg by mouth daily with breakfast.     glipiZIDE (GLUCOTROL XL) 5 MG 24 hr tablet Take 5 mg by mouth daily with breakfast.     Levothyroxine Sodium 125 MCG/ML SOLN      liothyronine (CYTOMEL) 5 MCG tablet Take 5 mcg by mouth daily.     losartan-hydrochlorothiazide (HYZAAR) 50-12.5 MG per tablet      Melatonin 1 MG TABS Take by mouth as needed.      metFORMIN (GLUCOPHAGE) 1000 MG tablet Take by mouth.     mometasone (ASMANEX) 220 MCG/INH inhaler Inhale into the lungs.     montelukast (SINGULAIR) 10 MG tablet TAKE 1 TABLET EVERY DAY 90 tablet 0   Omega-3 Fatty Acids (FISH OIL CONCENTRATE) 300 MG CAPS Take by mouth.      Turmeric Curcumin 500 MG CAPS Take by mouth.     No current facility-administered medications on file prior to visit.     Objective:     Vitals:   12/31/20 1442  BP: 138/80  Pulse: 74  Resp: 16    Filed Weights   12/31/20 1442  Weight: 206 lb 4.8 oz (93.6 kg)              Physical examination General NAD, Conversant  HEENT Atraumatic; Op clear with mmm.  Normo-cephalic. Pupils reactive. Anicteric sclerae  Thyroid/Neck Smooth without nodularity or enlargement. Normal ROM.  Neck Supple.  Skin No rashes, lesions or ulceration. Normal palpated skin turgor. No nodularity.  Breasts: No masses or discharge.  Symmetric.  No axillary adenopathy.  Lungs: Clear to auscultation.No rales or wheezes. Normal Respiratory effort, no retractions.  Heart: NSR.  No murmurs or rubs appreciated. No periferal edema  Abdomen: Soft.  Non-tender.  No masses.  No HSM. No hernia  Extremities: Moves all appropriately.  Normal ROM for age.  No lymphadenopathy.  Neuro: Oriented to PPT.  Normal mood. Normal affect.     Pelvic:   Vulva: Normal appearance.  No lesions.  Vagina: No lesions or abnormalities noted.  Support: Second-degree cystocele -second-degree rectocele  Urethra No masses tenderness or scarring.  Meatus Normal size without lesions or prolapse.  Cervix: Surgically absent  Anus: Normal exam.  No lesions.  Perineum: Normal exam.  No lesions.        Bimanual   Uterus: Surgically absent  Adnexae: No masses.  Non-tender to palpation.  Cul-de-sac: Negative for abnormality.     Assessment:    G1P0101 Patient Active Problem List   Diagnosis Date Noted   Varicose veins with pain 11/02/2019   Intractable chronic migraine without aura and without status migrainosus 04/26/2019   Primary osteoarthritis of left knee 12/05/2018   Leg pain 10/19/2017   Nocturnal leg cramps 10/19/2017   Chronic venous insufficiency 03/02/2017   Lymphedema 03/02/2017   Increased BMI 07/10/2015   Pelvic pain in female 07/10/2015   Vaginal atrophy 07/10/2015   Status post vaginal hysterectomy 07/10/2015     1. Well woman exam with routine gynecological exam        Plan:  1.  Basic Screening Recommendations The basic screening recommendations for asymptomatic women were discussed with the patient during her visit.  The age-appropriate recommendations were discussed with her and the rational for the tests reviewed.  When I am informed by the patient that another primary care physician has previously obtained the age-appropriate tests and they are up-to-date, only outstanding tests are ordered and referrals given as necessary.  Abnormal results of tests will be discussed with her when all of her results are completed.  Routine preventative health maintenance measures emphasized: Exercise/Diet/Weight control, Tobacco Warnings, Alcohol/Substance use risks and Stress Management Mammogram ordered Orders Orders Placed This  Encounter  Procedures   Cologuard    No orders of the defined types were placed in this encounter.           F/U  Return in about 1 year (around 12/31/2021) for Annual Physical.  Finis Bud, M.D. 12/31/2020 3:06 PM

## 2021-01-02 ENCOUNTER — Other Ambulatory Visit: Payer: Self-pay | Admitting: Internal Medicine

## 2021-01-02 ENCOUNTER — Other Ambulatory Visit: Payer: Self-pay | Admitting: Obstetrics and Gynecology

## 2021-01-02 DIAGNOSIS — Z1231 Encounter for screening mammogram for malignant neoplasm of breast: Secondary | ICD-10-CM

## 2021-01-03 ENCOUNTER — Telehealth: Payer: Self-pay | Admitting: Internal Medicine

## 2021-01-03 NOTE — Telephone Encounter (Signed)
Called Chief Strategy Officer in regards to cologuard. Dx code incorrect. Gave them the correct code. Kit will be mailed to patient and they will send verification of correct dx.

## 2021-01-03 NOTE — Telephone Encounter (Signed)
Dillon w/[employer] is calling in to give information to the provider about the pt having a negative result.  Call back and reference case # [id number].

## 2021-01-18 LAB — COLOGUARD: COLOGUARD: NEGATIVE

## 2021-01-28 ENCOUNTER — Other Ambulatory Visit: Payer: Self-pay

## 2021-01-28 ENCOUNTER — Encounter: Payer: Self-pay | Admitting: Internal Medicine

## 2021-01-28 ENCOUNTER — Ambulatory Visit: Payer: Medicare HMO | Admitting: Internal Medicine

## 2021-01-28 VITALS — BP 162/72 | HR 80 | Temp 98.3°F | Resp 16 | Ht 64.0 in | Wt 211.4 lb

## 2021-01-28 DIAGNOSIS — R0602 Shortness of breath: Secondary | ICD-10-CM | POA: Diagnosis not present

## 2021-01-28 DIAGNOSIS — G4733 Obstructive sleep apnea (adult) (pediatric): Secondary | ICD-10-CM | POA: Diagnosis not present

## 2021-01-28 DIAGNOSIS — Z9989 Dependence on other enabling machines and devices: Secondary | ICD-10-CM

## 2021-01-28 DIAGNOSIS — J4520 Mild intermittent asthma, uncomplicated: Secondary | ICD-10-CM

## 2021-01-28 MED ORDER — MOMETASONE FUROATE 220 MCG/ACT IN AEPB: 2.0000 | INHALATION_SPRAY | Freq: Every day | RESPIRATORY_TRACT | 5 refills | Status: DC

## 2021-01-28 NOTE — Progress Notes (Signed)
[facility] [place], [place] [address]  Pulmonary Sleep Medicine   Office Visit Note  Patient Name: Danielle Delgado DOB: May 13, 1950 MRN [id number]  Date of Service: 01/28/2021  Complaints/HPI: OSA has been doing well. She is using her machine and has had good results. She is using the PAP nightly. Asthma also under good control. No exacerbation of her breathing has been noted. Deneis having headaches. Only issue is mask fit. She is working on that. She states she will discuss with DME provider  ROS  General: (-) fever, (-) chills, (-) night sweats, (-) weakness Skin: (-) rashes, (-) itching,. Eyes: (-) visual changes, (-) redness, (-) itching. Nose and Sinuses: (-) nasal stuffiness or itchiness, (-) postnasal drip, (-) nosebleeds, (-) sinus trouble. Mouth and Throat: (-) sore throat, (-) hoarseness. Neck: (-) swollen glands, (-) enlarged thyroid, (-) neck pain. Respiratory: - cough, (-) bloody sputum, - shortness of breath, - wheezing. Cardiovascular: - ankle swelling, (-) chest pain. Lymphatic: (-) lymph node enlargement. Neurologic: (-) numbness, (-) tingling. Psychiatric: (-) anxiety, (-) depression   Current Medication: Outpatient Encounter Medications as of 01/28/2021  Medication Sig Note   ACCU-CHEK AVIVA PLUS test strip  01/17/2014: Received from: External Pharmacy   acetaminophen (TYLENOL) 650 MG CR tablet Take 650 mg by mouth every 8 (eight) hours as needed for pain.    albuterol (PROAIR HFA) 108 (90 Base) MCG/ACT inhaler Inhale 2 puffs into the lungs every 6 (six) hours as needed for wheezing or shortness of breath.    aspirin EC 81 MG tablet Take 81 mg by mouth daily.    atorvastatin (LIPITOR) 10 MG tablet  01/17/2014: Received from: External Pharmacy   Biotin 5000 MCG TABS Take 1 tablet by mouth daily.    calcium carbonate (OS-CAL) 600 MG TABS tablet Take 600 mg by mouth 2 (two) times daily with a meal.    cetirizine (ZYRTEC) 10 MG tablet  Take 10 mg by mouth daily.    ferrous sulfate 325 (65 FE) MG tablet Take 325 mg by mouth daily with breakfast.    glipiZIDE (GLUCOTROL XL) 5 MG 24 hr tablet Take 5 mg by mouth daily with breakfast.    Levothyroxine Sodium 125 MCG/ML SOLN     liothyronine (CYTOMEL) 5 MCG tablet Take 5 mcg by mouth daily.    losartan-hydrochlorothiazide (HYZAAR) 50-12.5 MG per tablet  01/17/2014: Received from: External Pharmacy   Melatonin 1 MG TABS Take by mouth as needed.     metFORMIN (GLUCOPHAGE) 1000 MG tablet Take by mouth. 11/06/2014: Received from: [facility]   mometasone [facility]) 220 MCG/INH inhaler Inhale into the lungs.    montelukast (SINGULAIR) 10 MG tablet TAKE 1 TABLET EVERY DAY    Omega-3 Fatty Acids (FISH OIL CONCENTRATE) 300 MG CAPS Take by mouth.  11/06/2014: Received from: [place] Curcumin 500 MG CAPS Take by mouth.    No facility-administered encounter medications on file as of 01/28/2021.    Surgical History: Past Surgical History:  Procedure Laterality Date   BREAST EXCISIONAL BIOPSY Right 1996   neg   BREAST SURGERY     biopsy   CHOLECYSTECTOMY     COLONOSCOPY WITH PROPOFOL N/A 10/12/2014   Procedure: COLONOSCOPY WITH PROPOFOL;  Surgeon: Manya Silvas, MD;  Location: [facility] ENDOSCOPY;  Service: Endoscopy;  Laterality: N/A;   DILATION AND CURETTAGE OF UTERUS     ESOPHAGOGASTRODUODENOSCOPY (EGD) WITH PROPOFOL N/A 10/12/2014   Procedure: ESOPHAGOGASTRODUODENOSCOPY (EGD)  WITH PROPOFOL;  Surgeon: Manya Silvas, MD;  Location: [facility] ENDOSCOPY;  Service: Endoscopy;  Laterality: N/A;   NASAL SINUS SURGERY     VAGINAL HYSTERECTOMY  1982    Medical History: Past Medical History:  Diagnosis Date   Asthma    Chronic vaginitis    Diabetes mellitus without complication (H[facility])    Dyspareunia in female    Eczema    FH: breast cancer    FH: colon cancer    GERD (gastroesophageal reflux disease)    Hiatus hernia syndrome     Hyperlipidemia    Hypertension    Hypothyroidism    Morbid obesity ([place])    Postmenopausal    Sleep apnea    Superficial varicosities    Vaginal atrophy     Family History: Family History  Problem Relation Age of Onset   Osteoporosis Mother    Breast cancer Maternal Aunt 60   Diabetes Maternal Grandmother    Diabetes Maternal Grandfather    Heart disease Maternal Grandfather    Colon cancer Cousin    Breast cancer Cousin 42       mat cousin   Ovarian cancer Neg Hx     Social History: Social History   Socioeconomic History   Marital status: Married    Spouse name: Not on file   Number of children: Not on file   Years of education: Not on file   Highest education level: Not on file  Occupational History   Not on file  Tobacco Use   Smoking status: Never   Smokeless tobacco: Never  Vaping Use   Vaping Use: Never used  Substance and Sexual Activity   Alcohol use: No    Alcohol/week: 0.0 standard drinks   Drug use: No   Sexual activity: Not Currently  Other Topics Concern   Not on file  Social History Narrative   Not on file   Social Determinants of Health   Financial Resource Strain: Not on file  Food Insecurity: Not on file  Transportation Needs: Not on file  Physical Activity: Not on file  Stress: Not on file  Social Connections: Not on file  Intimate Partner Violence: Not on file    Vital Signs: Blood pressure (!) 162/72, pulse 80, temperature 98.3 F (36.8 C), resp. rate 16, height 5\' 4"  (1.626 m), weight 211 lb 6.4 oz (95.9 kg), SpO2 96 %.  Examination: General Appearance: The patient is well-developed, well-nourished, and in no distress. Skin: Gross inspection of skin unremarkable. Head: normocephalic, no gross deformities. Eyes: no gross deformities noted. ENT: ears appear grossly normal no exudates. Neck: Supple. No thyromegaly. No LAD. Respiratory: no rhonci noted. Cardiovascular: Normal S1 and S2 without murmur or rub. Extremities: No  cyanosis. pulses are equal. Neurologic: Alert and oriented. No involuntary movements.  LABS: Recent Results (from the past 2160 hour(s))  Cologuard     Status: None   Collection Time: 01/14/21 12:00 PM  Result Value Ref Range   COLOGUARD Negative Negative    Comment:  NEGATIVE TEST RESULT. A negative Cologuard result indicates a low likelihood that a colorectal cancer (CRC) or advanced adenoma (adenomatous polyps with more advanced pre-malignant features)  is present. The chance that a person with a negative Cologuard test has a colorectal cancer is less than 1 in 1500 (negative predictive value >99.9%) or has an  advanced adenoma is less than  5.3% (negative predictive value 94.7%). These data are based on a prospective cross-sectional study of 10,000 individuals at  average risk for colorectal cancer who were screened with both Cologuard and colonoscopy. (Imperiale T. et al, N Engl J Med 2014;370(14):1286-1297) The normal value (reference range) for this assay is negative.  COLOGUARD RE-SCREENING RECOMMENDATION: Periodic colorectal cancer screening is an important part of preventive healthcare for asymptomatic individuals at average risk for colorectal cancer.  Following a negative Cologuard result, the [place] Task Force screening guidelines recommend a Cologuard re-screening interval of 3 years.  References: American Cancer Society Guideline for Colorectal Cancer Screening: https://www.cancer.org/cancer/colon-rectal-cancer/detection-diagnosis-staging/acs-recommendations.html.; Rex DK, Boland CR, Dominitz JK, Colorectal Cancer Screening: Recommendations for Physicians and Patients from the [place] Task Force on Colorectal Cancer Screening , Am J Gastroenterology 2017; 332:9518-8416.  TEST DESCRIPTION: Composite algorithmic analysis of stool DNA-biomarkers with hemoglobin immunoassay.   Quantitative values of individual biomarkers are not reportable  and are not associated with individual biomarker result reference ranges. Cologuard is intended for colorectal cancer screening of adults of either sex, 57 years or older, who are at average-risk for colorectal cancer (CRC). Cologuard has been approved for use by the U.S. FDA. The performance of Cologuard was  established in a cross sectional study of average-risk adults aged 46-84. Cologuard performance in patients ages 36 to 65 years was estimated by sub-group analysis of near-age groups. Colonoscopies performed for a positive result may find as the most clinically significant lesion: colorectal cancer [4.0%], advanced adenoma (including sessile serrated polyps greater than or equal to 1cm diameter) [20%] or non- advanced adenoma [31%]; or no colorectal neoplasia [45%]. These estimates are derived from a prospective cross-sectional screening study of 10,000 individuals at average risk for colorectal cancer who were screened with both Cologuard and colonoscopy. (Imperiale T. et al, Alison Stalling J Med 2014;370(14):1286-1297.) Cologuard may produce a false negative or false positive result (no colorectal cancer or precancerous polyp present at colonoscopy follow up). A negative Cologuard test result does not guarantee the absence of CRC or advanced adenoma (pre-cancer). The current Cologuard  screening interval is every 3 years. 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 Cologuard performance data in a 10,000 patient pivotal study using colonoscopy as the reference method can be accessed at the following location: www.[employer].com/results. Additional description of the Cologuard test process, warnings and precautions can be found at www.cologuard.com.     Radiology: MM 3D SCREEN BREAST BILATERAL  Result Date: 11/29/2019 CLINICAL DATA:  Screening. EXAM: DIGITAL SCREENING BILATERAL MAMMOGRAM WITH TOMO AND CAD COMPARISON:  Previous exam(s). ACR Breast Density Category b: There are scattered areas of  fibroglandular density. FINDINGS: There are no findings suspicious for malignancy. Images were processed with CAD. IMPRESSION: No mammographic evidence of malignancy. A result letter of this screening mammogram will be mailed directly to the patient. RECOMMENDATION: Screening mammogram in one year. (Code:SM-B-01Y) BI-RADS CATEGORY  1: Negative. Electronically Signed   By: Ammie Ferrier M.D.   On: 11/29/2019 13:53    No results found.  No results found.    Assessment and Plan: Patient Active Problem List   Diagnosis Date Noted   Varicose veins with pain 11/02/2019   Intractable chronic migraine without aura and without status migrainosus 04/26/2019   Primary osteoarthritis of left knee 12/05/2018   Leg pain 10/19/2017   Nocturnal leg cramps 10/19/2017   Chronic venous insufficiency 03/02/2017   Lymphedema 03/02/2017   Increased BMI 07/10/2015   Pelvic pain in female 07/10/2015   Vaginal atrophy 07/10/2015   Status post vaginal hysterectomy 07/10/2015    1. SOB (shortness  of breath)  - Spirometry with Graph  2. OSA on CPAP Continuew ith CPAP at the present pressures. She needs to have her mask looked at  3. Chronic asthma, mild intermittent, uncomplicated Controlled asmanex seems to help her with good control  4. Obesity, morbid ([place]) Obesity Counseling: Had a lengthy discussion regarding patients BMI and weight issues. Patient was instructed on portion control as well as increased activity. Also discussed caloric restrictions with trying to maintain intake less than 2000 Kcal. Discussions were made in accordance with the 5As of weight management. Simple actions such as not eating late and if able to, taking a walk is suggested.    General Counseling: I have discussed the findings of the evaluation and examination with Mariann Laster.  I have also discussed any further diagnostic evaluation thatmay be needed or ordered today. Francee verbalizes understanding of the findings of todays  visit. We also reviewed her medications today and discussed drug interactions and side effects including but not limited excessive drowsiness and altered mental states. We also discussed that there is always a risk not just to her but also people around her. she has been encouraged to call the office with any questions or concerns that should arise related to todays visit.  Orders Placed This Encounter  Procedures   Spirometry with Graph    Order Specific Question:   Where should this test be performed?    Answer:   Other     Time spent: 3  I have personally obtained a history, examined the patient, evaluated laboratory and imaging results, formulated the assessment and plan and placed orders.    Allyne Gee, MD [facility] Pulmonary and Critical Care Sleep medicine

## 2021-01-28 NOTE — Patient Instructions (Signed)
Asthma, Adult ?Asthma is a long-term (chronic) condition in which the airways get tight and narrow. The airways are the breathing passages that lead from the nose and mouth down into the lungs. A person with asthma will have times when symptoms get worse. These are called asthma attacks. They can cause coughing, whistling sounds when you breathe (wheezing), shortness of breath, and chest pain. They can make it hard to breathe. There is no cure for asthma, but medicines and lifestyle changes can help control it. ?There are many things that can bring on an asthma attack or make asthma symptoms worse (triggers). Common triggers include: ?Mold. ?Dust. ?Cigarette smoke. ?Cockroaches. ?Things that can cause allergy symptoms (allergens). These include animal skin flakes (dander) and pollen from trees or grass. ?Things that pollute the air. These may include household cleaners, wood smoke, smog, or chemical odors. ?Cold air, weather changes, and wind. ?Crying or laughing hard. ?Stress. ?Certain medicines or drugs. ?Certain foods such as dried fruit, potato chips, and grape juice. ?Infections, such as a cold or the flu. ?Certain medical conditions or diseases. ?Exercise or tiring activities. ?Asthma may be treated with medicines and by staying away from the things that cause asthma attacks. Types of medicines may include: ?Controller medicines. These help prevent asthma symptoms. They are usually taken every day. ?Fast-acting reliever or rescue medicines. These quickly relieve asthma symptoms. They are used as needed and provide short-term relief. ?Allergy medicines if your attacks are brought on by allergens. ?Medicines to help control the body's defense (immune) system. ?Follow these instructions at home: ?Avoiding triggers in your home ?Change your heating and air conditioning filter often. ?Limit your use of fireplaces and wood stoves. ?Get rid of pests (such as roaches and mice) and their droppings. ?Throw away plants  if you see mold on them. ?Clean your floors. Dust regularly. Use cleaning products that do not smell. ?Have someone vacuum when you are not home. Use a vacuum cleaner with a HEPA filter if possible. ?Replace carpet with wood, tile, or vinyl flooring. Carpet can trap animal skin flakes and dust. ?Use allergy-proof pillows, mattress covers, and box spring covers. ?Wash bed sheets and blankets every week in hot water. Dry them in a dryer. ?Keep your bedroom free of any triggers. ?Avoid pets and keep windows closed when things that cause allergy symptoms are in the air. ?Use blankets that are made of polyester or cotton. ?Clean bathrooms and kitchens with bleach. If possible, have someone repaint the walls in these rooms with mold-resistant paint. Keep out of the rooms that are being cleaned and painted. ?Wash your hands often with soap and water. If soap and water are not available, use hand sanitizer. ?Do not allow anyone to smoke in your home. ?General instructions ?Take over-the-counter and prescription medicines only as told by your doctor. ?Talk with your doctor if you have questions about how or when to take your medicines. ?Make note if you need to use your medicines more often than usual. ?Do not use any products that contain nicotine or tobacco, such as cigarettes and e-cigarettes. If you need help quitting, ask your doctor. ?Stay away from secondhand smoke. ?Avoid doing things outdoors when allergen counts are high and when air quality is low. ?Wear a ski mask when doing outdoor activities in the winter. The mask should cover your nose and mouth. Exercise indoors on cold days if you can. ?Warm up before you exercise. Take time to cool down after exercise. ?Use a peak flow meter as   told by your doctor. A peak flow meter is a tool that measures how well the lungs are working. ?Keep track of the peak flow meter's readings. Write them down. ?Follow your asthma action plan. This is a written plan for taking care  of your asthma and treating your attacks. ?Make sure you get all the shots (vaccines) that your doctor recommends. Ask your doctor about a flu shot and a pneumonia shot. ?Keep all follow-up visits as told by your doctor. This is important. ?Contact a doctor if: ?You have wheezing, shortness of breath, or a cough even while taking medicine to prevent attacks. ?The mucus you cough up (sputum) is thicker than usual. ?The mucus you cough up changes from clear or white to yellow, green, gray, or bloody. ?You have problems from the medicine you are taking, such as: ?A rash. ?Itching. ?Swelling. ?Trouble breathing. ?You need reliever medicines more than 2-3 times a week. ?Your peak flow reading is still at 50-79% of your personal best after following the action plan for 1 hour. ?You have a fever. ?Get help right away if: ?You seem to be worse and are not responding to medicine during an asthma attack. ?You are short of breath even at rest. ?You get short of breath when doing very little activity. ?You have trouble eating, drinking, or talking. ?You have chest pain or tightness. ?You have a fast heartbeat. ?Your lips or fingernails start to turn blue. ?You are [place]-headed or dizzy, or you faint. ?Your peak flow is less than 50% of your personal best. ?You feel too tired to breathe normally. ?Summary ?Asthma is a long-term (chronic) condition in which the airways get tight and narrow. An asthma attack can make it hard to breathe. ?Asthma cannot be cured, but medicines and lifestyle changes can help control it. ?Make sure you understand how to avoid triggers and how and when to use your medicines. ?This information is not intended to replace advice given to you by your health care provider. Make sure you discuss any questions you have with your health care provider. ?Document Revised: 06/11/2019 Document Reviewed: 06/21/2019 ?Elsevier Patient Education ? 2022 [employer] ? ?

## 2021-02-19 ENCOUNTER — Other Ambulatory Visit: Payer: Self-pay

## 2021-02-19 ENCOUNTER — Ambulatory Visit: Payer: Medicare HMO | Admitting: Podiatry

## 2021-02-19 ENCOUNTER — Telehealth: Payer: Self-pay | Admitting: Podiatry

## 2021-02-19 ENCOUNTER — Encounter: Payer: Self-pay | Admitting: Podiatry

## 2021-02-19 DIAGNOSIS — E119 Type 2 diabetes mellitus without complications: Secondary | ICD-10-CM | POA: Diagnosis not present

## 2021-02-19 NOTE — Telephone Encounter (Signed)
Pt seen Dr Milinda Pointer today and asked about her diabetic shoes.  I called pt after checking safestep and left message that we have not received the needed documents from Dr Smith Mince. Upon calling the office the fax number has changed and they are only allowed to give it out if we are faxing things manually. I have faxed it manually to them to get the signatures.

## 2021-02-19 NOTE — Progress Notes (Signed)
She presents today for diabetic foot exam.  No problems she states.  Objective: Vital signs are stable she is alert and oriented x3.  Pulses are strongly palpable capillary fill time is immediate neurologic sensorium is intact per [employer] monofilament.  Deetjen reflexes are intact.  Muscle strength is normal symmetrical bilateral.  Orthopedic evaluation demonstrates all joints distal to the ankle and full range of motion without crepitation.  Cutaneous evaluation demonstrates no open lesions or wounds.  Mild thickening of the hallux nail right and second digit nail right.  Assessment: Diabetes mellitus without complications.  Plan: Follow-up with me in 6 months for another diabetic check.  We will look for her shoes that she states that she get 14 weeks ago.

## 2021-03-05 ENCOUNTER — Ambulatory Visit
Admission: RE | Admit: 2021-03-05 | Discharge: 2021-03-05 | Disposition: A | Discharge status: Home / Self Care | Payer: Medicare HMO | Source: Ambulatory Visit | Attending: Obstetrics and Gynecology | Admitting: Obstetrics and Gynecology

## 2021-03-05 ENCOUNTER — Ambulatory Visit: Payer: Medicare HMO | Admitting: Podiatry

## 2021-03-05 ENCOUNTER — Other Ambulatory Visit: Payer: Self-pay

## 2021-03-05 DIAGNOSIS — Z1231 Encounter for screening mammogram for malignant neoplasm of breast: Secondary | ICD-10-CM | POA: Insufficient documentation

## 2021-03-17 ENCOUNTER — Other Ambulatory Visit: Payer: Self-pay

## 2021-03-17 ENCOUNTER — Ambulatory Visit: Payer: Medicare HMO

## 2021-03-17 DIAGNOSIS — E119 Type 2 diabetes mellitus without complications: Secondary | ICD-10-CM

## 2021-03-17 DIAGNOSIS — M2041 Other hammer toe(s) (acquired), right foot: Secondary | ICD-10-CM | POA: Diagnosis not present

## 2021-03-17 DIAGNOSIS — M2042 Other hammer toe(s) (acquired), left foot: Secondary | ICD-10-CM | POA: Diagnosis not present

## 2021-03-17 DIAGNOSIS — M2040 Other hammer toe(s) (acquired), unspecified foot: Secondary | ICD-10-CM

## 2021-03-17 NOTE — Progress Notes (Signed)
SITUATION Reason for Visit: Fitting of Diabetic Shoes & Insoles Patient / Caregiver Report:  Patient reports comfort and is satisfied  OBJECTIVE DATA: Patient History / Diagnosis:     ICD-10-CM   1. Diabetes mellitus without complication (HCC)  E42.3     2. Hammer toe, unspecified laterality  M20.40       Change in Status:   None  ACTIONS PERFORMED: In-Person Delivery, patient was fit with: - 1x pair [id number] PDAC approved prefabricated Diabetic Shoes: Apex dress shoes - 3x pair [id number] PDAC approved CAM milled custom diabetic insoles  Shoes and insoles were verified for structural integrity and safety. Patient wore shoes and insoles in office. Skin was inspected and free of areas of concern after wearing shoes and inserts. Shoes and inserts fit properly. Patient / Caregiver provided with ferbal instruction and demonstration regarding donning, doffing, wear, care, proper fit, function, purpose, cleaning, and use of shoes and insoles ' and in all related precautions and risks and benefits regarding shoes and insoles. Patient / Caregiver was instructed to wear properly fitting socks with shoes at all times. Patient was also provided with verbal instruction regarding how to report any failures or malfunctions of shoes or inserts, and necessary follow up care. Patient / Caregiver was also instructed to contact physician regarding change in status that may affect function of shoes and inserts.   Patient / Caregiver verbalized undersatnding of instruction provided. Patient / Caregiver demonstrated independence with proper donning and doffing of shoes and inserts.  PLAN Patient to follow up as needed. Plan of care was discussed with and agreed upon by patient and/or caregiver. All questions were answered and concerns addressed.

## 2021-04-08 ENCOUNTER — Other Ambulatory Visit: Payer: Self-pay

## 2021-04-08 MED ORDER — PANTOPRAZOLE SODIUM 40 MG PO TBEC: 40.0000 mg | DELAYED_RELEASE_TABLET | Freq: Every day | ORAL | 1 refills | Status: DC

## 2021-04-08 NOTE — Telephone Encounter (Signed)
Spoke with pt that pantoprazole is not on her ld/c list she said she stopped and than she said start back and as per dr Samuella Bruin send pantoprazole to phar and advised to keep appt

## 2021-04-16 ENCOUNTER — Other Ambulatory Visit: Payer: Self-pay

## 2021-04-16 MED ORDER — PANTOPRAZOLE SODIUM 40 MG PO TBEC: 40.0000 mg | DELAYED_RELEASE_TABLET | Freq: Every day | ORAL | 1 refills | Status: DC

## 2021-08-20 ENCOUNTER — Ambulatory Visit (INDEPENDENT_AMBULATORY_CARE_PROVIDER_SITE_OTHER): Payer: Medicare HMO | Admitting: Podiatry

## 2021-08-20 ENCOUNTER — Encounter: Payer: Self-pay | Admitting: Podiatry

## 2021-08-20 DIAGNOSIS — S90416A Abrasion, unspecified lesser toe(s), initial encounter: Secondary | ICD-10-CM | POA: Diagnosis not present

## 2021-08-20 DIAGNOSIS — E119 Type 2 diabetes mellitus without complications: Secondary | ICD-10-CM

## 2021-08-20 NOTE — Progress Notes (Signed)
She presents today for follow-up of her diabetes for her diabetic foot exam.  Her last A1c was at 6.8 and she states that she got a pedicure and they roughed up some ends of her toes.  She wants those checked and wants to make sure that her sensation is unchanged.  She states that they have started to have a bit of a numb feeling.  Objective: Vital signs are stable she is alert and oriented x3 pulses are palpable.  Neurologic sensorium is intact per [employer] monofilament.  Vibratory sensation is intact.  Deep tendon reflexes are intact muscle strength is normal and symmetrical bilateral.  Cutaneous evaluation demonstrates well Nails though she does have some excoriations that appear to be healing to the proximal nail fold on 1 toe on the right foot and 2 toes of the left foot.  I see no signs of infection no purulence no malodor no lacerations.  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  Otherwise no complications.  Plan: Follow-up with me in 6 months.  We are going to have her follow-up with Aaron Edelman for diabetic shoes.

## 2021-09-15 ENCOUNTER — Ambulatory Visit (INDEPENDENT_AMBULATORY_CARE_PROVIDER_SITE_OTHER): Payer: Medicare HMO | Admitting: Podiatry

## 2021-09-15 DIAGNOSIS — M2042 Other hammer toe(s) (acquired), left foot: Secondary | ICD-10-CM

## 2021-09-15 DIAGNOSIS — E119 Type 2 diabetes mellitus without complications: Secondary | ICD-10-CM

## 2021-09-15 DIAGNOSIS — M2041 Other hammer toe(s) (acquired), right foot: Secondary | ICD-10-CM

## 2021-09-15 DIAGNOSIS — M2040 Other hammer toe(s) (acquired), unspecified foot: Secondary | ICD-10-CM

## 2021-09-15 NOTE — Progress Notes (Signed)
Patient presents to get measured for Diabetic shoes and inserts.  She was measured as a size 7 length and wide foot.  She chose to get the Apex Conform shoe.  I informed her that it usually takes about eight weeks for the shoes and inserts to come in.  I told her we would give her a call when we receive them and will schedule her an appointment.

## 2021-09-26 ENCOUNTER — Other Ambulatory Visit: Payer: Self-pay | Admitting: Internal Medicine

## 2021-11-11 ENCOUNTER — Ambulatory Visit (INDEPENDENT_AMBULATORY_CARE_PROVIDER_SITE_OTHER): Payer: Medicare HMO | Admitting: Obstetrics and Gynecology

## 2021-11-11 ENCOUNTER — Encounter: Payer: Self-pay | Admitting: Obstetrics and Gynecology

## 2021-11-11 VITALS — BP 126/77 | HR 82 | Ht 64.0 in | Wt 218.5 lb

## 2021-11-11 DIAGNOSIS — Z1231 Encounter for screening mammogram for malignant neoplasm of breast: Secondary | ICD-10-CM

## 2021-11-11 DIAGNOSIS — Z01419 Encounter for gynecological examination (general) (routine) without abnormal findings: Secondary | ICD-10-CM

## 2021-11-11 NOTE — Progress Notes (Signed)
Patients presents for annual exam today. 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 History of hysterectomy. Due for mammogram in January, ordered. Annual labs are declined, done by PCP. Patient states no other questions or concerns at this time.

## 2021-11-11 NOTE — Progress Notes (Signed)
HPI:      [name] is a 71 y.o. G1P0101 who LMP was No LMP recorded. Patient has had a hysterectomy.  Subjective:   She presents today for her annual examination.  She has no complaints.  She says she is generally doing well.  She has had a hysterectomy.  Her Cologuard was negative last year.  Her mammogram is due in January.  She is taking her medication as directed for diabetes and thyroid.    Hx: The following portions of the patient's history were reviewed and updated as appropriate:             She  has a past medical history of Asthma, Chronic vaginitis, Diabetes mellitus without complication ([place]), Dyspareunia in female, Eczema, FH: breast cancer, FH: colon cancer, GERD (gastroesophageal reflux disease), Hiatus hernia syndrome, Hyperlipidemia, Hypertension, Hypothyroidism, Morbid obesity ([place]), Postmenopausal, Sleep apnea, Superficial varicosities, and Vaginal atrophy. She does not have any pertinent problems on file. She  has a past surgical history that includes Cholecystectomy; Colonoscopy with propofol (N/A, 10/12/2014); Esophagogastroduodenoscopy (egd) with propofol (N/A, 10/12/2014); Nasal sinus surgery; Breast surgery; Vaginal hysterectomy (1982); Dilation and curettage of uterus; and Breast excisional biopsy (Right, 1996). Her family history includes Breast cancer (age of onset: 61) in her cousin; Breast cancer (age of onset: 60) in her maternal aunt; Colon cancer in her cousin; Diabetes in her maternal grandfather and maternal grandmother; Heart disease in her maternal grandfather; Osteoporosis in her mother. She  reports that she has never smoked. She has never used smokeless tobacco. She reports that she does not drink alcohol and does not use drugs. She has a current medication list which includes the following prescription(s): accu-chek aviva plus, acetaminophen, albuterol, aspirin ec, atorvastatin, biotin, calcium carbonate, cetirizine, dapagliflozin propanediol, ferrous  sulfate, glipizide, levothyroxine, liothyronine, losartan-hydrochlorothiazide, melatonin, metformin, mometasone, montelukast, fish oil concentrate, pantoprazole, and turmeric curcumin. She is allergic to cisapride, sulfa antibiotics, and biaxin [clarithromycin].       Review of Systems:  Review of Systems  Constitutional: Denied constitutional symptoms, night sweats, recent illness, fatigue, fever, insomnia and weight loss.  Eyes: Denied eye symptoms, eye pain, photophobia, vision change and visual disturbance.  Ears/Nose/Throat/Neck: Denied ear, nose, throat or neck symptoms, hearing loss, nasal discharge, sinus congestion and sore throat.  Cardiovascular: Denied cardiovascular symptoms, arrhythmia, chest pain/pressure, edema, exercise intolerance, orthopnea and palpitations.  Respiratory: Denied pulmonary symptoms, asthma, pleuritic pain, productive sputum, cough, dyspnea and wheezing.  Gastrointestinal: Denied, gastro-esophageal reflux, melena, nausea and vomiting.  Genitourinary: Denied genitourinary symptoms including symptomatic vaginal discharge, pelvic relaxation issues, and urinary complaints.  Musculoskeletal: Denied musculoskeletal symptoms, stiffness, swelling, muscle weakness and myalgia.  Dermatologic: Denied dermatology symptoms, rash and scar.  Neurologic: Denied neurology symptoms, dizziness, headache, neck pain and syncope.  Psychiatric: Denied psychiatric symptoms, anxiety and depression.  Endocrine: Denied endocrine symptoms including hot flashes and night sweats.   Meds:   Current Outpatient Medications on File Prior to Visit  Medication Sig Dispense Refill   ACCU-CHEK AVIVA PLUS test strip      acetaminophen (TYLENOL) 650 MG CR tablet Take 650 mg by mouth every 8 (eight) hours as needed for pain.     albuterol (PROAIR HFA) 108 (90 Base) MCG/ACT inhaler Inhale 2 puffs into the lungs every 6 (six) hours as needed for wheezing or shortness of breath. 8 g 0   aspirin EC 81  MG tablet Take 81 mg by mouth daily.     atorvastatin (LIPITOR) 10 MG tablet  Biotin 5000 MCG TABS Take 1 tablet by mouth daily.     calcium carbonate (OS-CAL) 600 MG TABS tablet Take 600 mg by mouth 2 (two) times daily with a meal.     cetirizine (ZYRTEC) 10 MG tablet Take 10 mg by mouth daily.     dapagliflozin propanediol (FARXIGA) 10 MG TABS tablet Take by mouth daily.     ferrous sulfate 325 (65 FE) MG tablet Take 325 mg by mouth daily with breakfast.     glipiZIDE (GLUCOTROL XL) 5 MG 24 hr tablet Take 5 mg by mouth daily with breakfast.     levothyroxine (SYNTHROID) 88 MCG tablet      liothyronine (CYTOMEL) 5 MCG tablet Take 5 mcg by mouth daily.     losartan-hydrochlorothiazide (HYZAAR) 50-12.5 MG per tablet      Melatonin 1 MG TABS Take by mouth as needed.      metFORMIN (GLUCOPHAGE) 1000 MG tablet Take by mouth.     mometasone (ASMANEX) 220 MCG/ACT inhaler Inhale 2 puffs into the lungs daily. 3 each 5   montelukast (SINGULAIR) 10 MG tablet TAKE 1 TABLET EVERY DAY 90 tablet 1   Omega-3 Fatty Acids (FISH OIL CONCENTRATE) 300 MG CAPS Take by mouth.      pantoprazole (PROTONIX) 40 MG tablet Take 1 tablet (40 mg total) by mouth daily. 90 tablet 1   Turmeric Curcumin 500 MG CAPS Take by mouth.     No current facility-administered medications on file prior to visit.     Objective:     Vitals:   11/11/21 1434  BP: 126/77  Pulse: 82    Filed Weights   11/11/21 1434  Weight: 218 lb 8 oz (99.1 kg)              Physical examination General NAD, Conversant  HEENT Atraumatic; Op clear with mmm.  Normo-cephalic. Pupils reactive. Anicteric sclerae  Thyroid/Neck Smooth without nodularity or enlargement. Normal ROM.  Neck Supple.  Skin No rashes, lesions or ulceration. Normal palpated skin turgor. No nodularity.  Breasts: No masses or discharge.  Symmetric.  No axillary adenopathy.  Lungs: Clear to auscultation.No rales or wheezes. Normal Respiratory effort, no retractions.   Heart: NSR.  No murmurs or rubs appreciated. No periferal edema  Abdomen: Soft.  Non-tender.  No masses.  No HSM. No hernia  Extremities: Moves all appropriately.  Normal ROM for age. No lymphadenopathy.  Neuro: Oriented to PPT.  Normal mood. Normal affect.     Pelvic:   Vulva: Normal appearance.  No lesions.   Vagina: No lesions or abnormalities noted.  Support: Normal pelvic support.  Urethra No masses tenderness or scarring.  Meatus Normal size without lesions or prolapse.  Cervix: Surgically absent   Anus: Normal exam.  No lesions.  Perineum: Normal exam.  No lesions.        Bimanual   Uterus: Surgically absent   Adnexae: No masses.  Non-tender to palpation.  Cul-de-sac: Negative for abnormality.     Assessment:    G1P0101 Patient Active Problem List   Diagnosis Date Noted   Varicose veins with pain 11/02/2019   Intractable chronic migraine without aura and without status migrainosus 04/26/2019   Primary osteoarthritis of left knee 12/05/2018   Leg pain 10/19/2017   Nocturnal leg cramps 10/19/2017   Chronic venous insufficiency 03/02/2017   Lymphedema 03/02/2017   Increased BMI 07/10/2015   Pelvic pain in female 07/10/2015   Vaginal atrophy 07/10/2015   Status post vaginal hysterectomy 07/10/2015  1. Well woman exam with routine gynecological exam   2. Screening mammogram for breast cancer     Normal exam   Plan:            1.  Basic Screening Recommendations The basic screening recommendations for asymptomatic women were discussed with the patient during her visit.  The age-appropriate recommendations were discussed with her and the rational for the tests reviewed.  When I am informed by the patient that another primary care physician has previously obtained the age-appropriate tests and they are up-to-date, only outstanding tests are ordered and referrals given as necessary.  Abnormal results of tests will be discussed with her when all of her results are  completed.  Routine preventative health maintenance measures emphasized: Exercise/Diet/Weight control, Tobacco Warnings, Alcohol/Substance use risks and Stress Management  Mammogram ordered Orders Orders Placed This Encounter  Procedures   MM DIGITAL SCREENING BILATERAL    No orders of the defined types were placed in this encounter.         F/U  No follow-ups on file.  Finis Bud, M.D. 11/11/2021 3:21 PM

## 2021-11-26 ENCOUNTER — Other Ambulatory Visit: Payer: Self-pay | Admitting: Internal Medicine

## 2021-12-29 ENCOUNTER — Encounter (INDEPENDENT_AMBULATORY_CARE_PROVIDER_SITE_OTHER): Payer: Self-pay

## 2021-12-30 ENCOUNTER — Inpatient Hospital Stay: Payer: Medicare HMO

## 2021-12-30 ENCOUNTER — Inpatient Hospital Stay: Payer: Medicare HMO | Attending: Internal Medicine | Admitting: Internal Medicine

## 2021-12-30 ENCOUNTER — Encounter: Payer: Self-pay | Admitting: Internal Medicine

## 2021-12-30 DIAGNOSIS — Z803 Family history of malignant neoplasm of breast: Secondary | ICD-10-CM

## 2021-12-30 DIAGNOSIS — D72829 Elevated white blood cell count, unspecified: Secondary | ICD-10-CM | POA: Diagnosis not present

## 2021-12-30 DIAGNOSIS — I10 Essential (primary) hypertension: Secondary | ICD-10-CM | POA: Diagnosis not present

## 2021-12-30 DIAGNOSIS — E119 Type 2 diabetes mellitus without complications: Secondary | ICD-10-CM

## 2021-12-30 DIAGNOSIS — Z800 Family history of malignant neoplasm of digestive organs: Secondary | ICD-10-CM

## 2021-12-30 NOTE — Progress Notes (Signed)
[place]  [phone] [phone]  ID: SAFIATOU ISLAM OB: 1950/07/17  MR#: [id number]  EGB#:[id number]  Patient Care Team: Casilda Carls, MD as PCP - General (Internal Medicine)  REFERRING PROVIDER: Dr. Casilda Carls  REASON FOR REFERRAL: leucocytosis  HPI: Danielle Delgado is a 71 y.o. female with past medical history of asthma, diabetes, eczema, GERD, hypertension, hyperlipidemia, hypothyroidism was referred to hematology for leukocytosis.  Patient reports feeling well. Patient denies fever, chills, nausea, vomiting, shortness of breath, cough, abdominal pain, bleeding, bowel or bladder issues. No night sweats, or lumps. Energy level is good.  Appetite is good.  Denies any weight loss.  Patient was seen by Hematology Georgeanne Nim, NP on 11/06/2014 for same reason. Of note, they reviewed the CBC dating back to 2009 and WBC has been around 11K. Monitoring was recommended.   CBC from 11/26/2021 showed WBC 11.7, hemoglobin 13.1 and platelets 328.  ANC 8.1.  CBC from 11/05/2016 showed WBC 12.1.  From 2016 WBC 11.4 and 15.3.  With neutrophilia    REVIEW OF SYSTEMS:   ROS  As per HPI. Otherwise, a complete review of systems is negative.  PAST MEDICAL HISTORY: Past Medical History:  Diagnosis Date   Asthma    Chronic vaginitis    Diabetes mellitus without complication ([place])    Dyspareunia in female    Eczema    FH: breast cancer    FH: colon cancer    GERD (gastroesophageal reflux disease)    Hiatus hernia syndrome    Hyperlipidemia    Hypertension    Hypothyroidism    Morbid obesity ([place])    Postmenopausal    Sleep apnea    Superficial varicosities    Vaginal atrophy     PAST SURGICAL HISTORY: Past Surgical History:  Procedure Laterality Date   BREAST EXCISIONAL BIOPSY Right 1996   neg   BREAST SURGERY     biopsy   CHOLECYSTECTOMY     COLONOSCOPY WITH PROPOFOL N/A 10/12/2014   Procedure: COLONOSCOPY WITH PROPOFOL;  Surgeon:  Manya Silvas, MD;  Location: [place];  Service: Endoscopy;  Laterality: N/A;   DILATION AND CURETTAGE OF UTERUS     ESOPHAGOGASTRODUODENOSCOPY (EGD) WITH PROPOFOL N/A 10/12/2014   Procedure: ESOPHAGOGASTRODUODENOSCOPY (EGD) WITH PROPOFOL;  Surgeon: Manya Silvas, MD;  Location: [facility] ENDOSCOPY;  Service: Endoscopy;  Laterality: N/A;   NASAL SINUS SURGERY     VAGINAL HYSTERECTOMY  1982    FAMILY HISTORY: Family History  Problem Relation Age of Onset   Osteoporosis Mother    Breast cancer Maternal Aunt 60   Diabetes Maternal Grandmother    Diabetes Maternal Grandfather    Heart disease Maternal Grandfather    Colon cancer Cousin    Breast cancer Cousin 49       mat cousin   Ovarian cancer Neg Hx     HEALTH MAINTENANCE: Social History   Tobacco Use   Smoking status: Never   Smokeless tobacco: Never  Vaping Use   Vaping Use: Never used  Substance Use Topics   Alcohol use: No    Alcohol/week: 0.0 standard drinks of alcohol   Drug use: No     Allergies  Allergen Reactions   Cisapride Hives   Sulfa Antibiotics Hives   Biaxin [Clarithromycin]     Current Outpatient Medications  Medication Sig Dispense Refill   ACCU-CHEK AVIVA PLUS test strip      acetaminophen (TYLENOL) 650 MG CR tablet Take 650 mg by mouth every  8 (eight) hours as needed for pain.     albuterol (PROAIR HFA) 108 (90 Base) MCG/ACT inhaler Inhale 2 puffs into the lungs every 6 (six) hours as needed for wheezing or shortness of breath. 8 g 0   amLODipine (NORVASC) 5 MG tablet Take 5 mg by mouth daily.     aspirin EC 81 MG tablet Take 81 mg by mouth daily.     atorvastatin (LIPITOR) 10 MG tablet      Biotin 5000 MCG TABS Take 1 tablet by mouth daily.     calcium carbonate (OS-CAL) 600 MG TABS tablet Take 600 mg by mouth 2 (two) times daily with a meal.     cetirizine (ZYRTEC) 10 MG tablet Take 10 mg by mouth daily.     dapagliflozin propanediol (FARXIGA) 10 MG TABS tablet Take by mouth daily.      ferrous sulfate 325 (65 FE) MG tablet Take 325 mg by mouth daily with breakfast.     glipiZIDE (GLUCOTROL XL) 5 MG 24 hr tablet Take 5 mg by mouth daily with breakfast.     levothyroxine (SYNTHROID) 88 MCG tablet      liothyronine (CYTOMEL) 5 MCG tablet Take 5 mcg by mouth daily.     losartan-hydrochlorothiazide (HYZAAR) 50-12.5 MG per tablet      metFORMIN (GLUCOPHAGE) 1000 MG tablet Take by mouth.     mometasone (ASMANEX) 220 MCG/ACT inhaler Inhale 2 puffs into the lungs daily. 3 each 5   montelukast (SINGULAIR) 10 MG tablet TAKE 1 TABLET EVERY DAY 90 tablet 1   Omega-3 Fatty Acids (FISH OIL CONCENTRATE) 300 MG CAPS Take by mouth.      pantoprazole (PROTONIX) 40 MG tablet TAKE 1 TABLET EVERY DAY 90 tablet 1   Turmeric Curcumin 500 MG CAPS Take by mouth.     levothyroxine (SYNTHROID) 125 MCG tablet  (Patient not taking: Reported on 12/30/2021)     levothyroxine (SYNTHROID) 88 MCG tablet TAKE 1 TABLET BY MOUTH EVERY DAY IN THE MORNING (Patient not taking: Reported on 12/30/2021)     Melatonin 1 MG TABS Take by mouth as needed.      No current facility-administered medications for this visit.    OBJECTIVE: Vitals:   12/30/21 1408  BP: (!) 147/75  Pulse: 80  Resp: 19  Temp: (!) 96.6 F (35.9 C)  SpO2: 98%     Body mass index is 38.04 kg/m.      General: Well-developed, well-nourished, no acute distress. Eyes: Pink conjunctiva, anicteric sclera. HEENT: Normocephalic, moist mucous membranes, clear oropharnyx. Lungs: Clear to auscultation bilaterally. Heart: Regular rate and rhythm. No rubs, murmurs, or gallops. Abdomen: Soft, nontender, nondistended. No organomegaly noted, normoactive bowel sounds. Musculoskeletal: No edema, cyanosis, or clubbing. Neuro: Alert, answering all questions appropriately. Cranial nerves grossly intact. Skin: No rashes or petechiae noted. Psych: Normal affect. Lymphatics: No cervical, calvicular, axillary or inguinal LAD.   LAB RESULTS:  No  results found for: "NA", "K", "CL", "CO2", "GLUCOSE", "BUN", "CREATININE", "CALCIUM", "PROT", "ALBUMIN", "AST", "ALT", "ALKPHOS", "BILITOT", "GFRNONAA", "GFRAA"  Lab Results  Component Value Date   WBC 11.4 (H) 11/06/2014   NEUTROABS 6.9 (H) 11/06/2014   HGB 11.8 (L) 11/06/2014   HCT 35.5 11/06/2014   MCV 89.3 11/06/2014   PLT 284 11/06/2014    No results found for: "TIBC", "FERRITIN", "IRONPCTSAT"   STUDIES: No results found.  ASSESSMENT AND PLAN:   DEEDRA PRO is a 71 y.o. female with pmh of asthma, diabetes, eczema, GERD, hypertension, hyperlipidemia,  hypothyroidism was referred to hematology for leukocytosis.  #Chronic leucocytosis #Neutrophilia - Patient was seen by Hematology Georgeanne Nim, NP on 11/06/2014 for same reason. Of note, they reviewed the CBC dating back to 2009 and WBC has been around 11K. Monitoring was recommended.   - CBC from 11/26/2021 showed WBC 11.7, hemoglobin 13.1 and platelets 328.  ANC 8.1.  CBC from 11/05/2016 showed WBC 12.1.  From 2016 WBC 11.4 and 15.3.  With neutrophilia  - WBC has been stable around 11-12k at least since 2016. I do not have labs from 2009. But of prior Hematology note, wbc ~11 dating back to 2009.  - Considering patient is asymptomatic and stability of WBC count, continue with monitoring. Chronic leukemia less likely. Pt denies smoking and very occasional use of b2 agonists inhalers. She can continue to follow with Dr. Rosario Jacks for annual CBC with diff. If there is persistent increase in WBC or pt is symptomatic such as but not limited to fatigue, weight loss, fever, night sweats,lumps should be referred back to me.    Patient expressed understanding and was in agreement with this plan. She also understands that She can call clinic at any time with any questions, concerns, or complaints.   PRN f/u  I spent a total of 45 minutes reviewing chart data, face-to-face evaluation with the patient, counseling and coordination of care as  detailed above.  Jane Canary, MD   12/30/2021 2:25 PM

## 2022-01-27 ENCOUNTER — Ambulatory Visit: Payer: Medicare HMO | Admitting: Internal Medicine

## 2022-01-27 ENCOUNTER — Encounter: Payer: Self-pay | Admitting: Internal Medicine

## 2022-01-27 VITALS — BP 166/70 | HR 88 | Temp 98.0°F | Resp 16 | Ht 64.0 in | Wt 219.8 lb

## 2022-01-27 DIAGNOSIS — R0602 Shortness of breath: Secondary | ICD-10-CM

## 2022-01-27 DIAGNOSIS — G4733 Obstructive sleep apnea (adult) (pediatric): Secondary | ICD-10-CM | POA: Diagnosis not present

## 2022-01-27 DIAGNOSIS — J4520 Mild intermittent asthma, uncomplicated: Secondary | ICD-10-CM | POA: Diagnosis not present

## 2022-01-27 DIAGNOSIS — Z7189 Other specified counseling: Secondary | ICD-10-CM

## 2022-01-27 MED ORDER — FLUTICASONE PROPIONATE HFA 110 MCG/ACT IN AERO: 2.0000 | INHALATION_SPRAY | Freq: Two times a day (BID) | RESPIRATORY_TRACT | 5 refills | Status: DC

## 2022-01-27 NOTE — Patient Instructions (Signed)
Asthma, Adult  Asthma is a condition that causes swelling and narrowing of the airways. These are the passages that lead from the nose and mouth down into the lungs. When asthma symptoms get worse it is called an asthma attack or flare. This can make it hard to breathe. Asthma flares can range from minor to life-threatening. There is no cure for asthma, but medicines and lifestyle changes can help to control it. What are the causes? It is not known exactly what causes asthma, but certain things can cause asthma symptoms to get worse (triggers). What can trigger an asthma attack? Cigarette smoke. Mold. Dust. Your pet's skin flakes (dander). Cockroaches. Pollen. Air pollution (like household cleaners, wood smoke, smog, or chemical odors). What are the signs or symptoms? Trouble breathing (shortness of breath). Coughing. Making high-pitched whistling sounds when you breathe, most often when you breathe out (wheezing). Chest tightness. Tiredness with little activity. Poor exercise tolerance. How is this treated? Controller medicines that help prevent asthma symptoms. Fast-acting reliever or rescue medicines. These give short-term relief of asthma symptoms. Allergy medicines if your attacks are brought on by allergens. Medicines to help control the body's defense (immune) system. Staying away from the things that cause asthma attacks. Follow these instructions at home: Avoiding triggers in your home Do not allow anyone to smoke in your home. Limit use of fireplaces and wood stoves. Get rid of pests (such as roaches and mice) and their droppings. Keep your home clean. Clean your floors. Dust regularly. Use cleaning products that do not smell. Wash bed sheets and blankets every week in hot water. Dry them in a dryer. Have someone vacuum when you are not home. Change your heating and air conditioning filters often. Use blankets that are made of polyester or cotton. General  instructions Take over-the-counter and prescription medicines only as told by your doctor. Do not smoke or use any products that contain nicotine or tobacco. If you need help quitting, ask your doctor. Stay away from secondhand smoke. Avoid doing things outdoors when allergen counts are high and when air quality is low. Warm up before you exercise. Take time to cool down after exercise. Use a peak flow meter as told by your doctor. A peak flow meter is a tool that measures how well your lungs are working. Keep track of the peak flow meter's readings. Write them down. Follow your asthma action plan. This is a written plan for taking care of your asthma and treating your attacks. Make sure you get all the shots (vaccines) that your doctor recommends. Ask your doctor about a flu shot and a pneumonia shot. Keep all follow-up visits. Contact a doctor if: You have wheezing, shortness of breath, or a cough even while taking medicine to prevent attacks. The mucus you cough up (sputum) is thicker than usual. The mucus you cough up changes from clear or white to yellow, green, gray, or is bloody. You have problems from the medicine you are taking, such as: A rash. Itching. Swelling. Trouble breathing. You need reliever medicines more than 2-3 times a week. Your peak flow reading is still at 50-79% of your personal best after following the action plan for 1 hour. You have a fever. Get help right away if: You seem to be worse and are not responding to medicine during an asthma attack. You are short of breath even at rest. You get short of breath when doing very little activity. You have trouble eating, drinking, or talking. You have chest   pain or tightness. You have a fast heartbeat. Your lips or fingernails start to turn blue. You are [place]-headed or dizzy, or you faint. Your peak flow is less than 50% of your personal best. You feel too tired to breathe normally. These symptoms may be an  emergency. Get help right away. Call 911. Do not wait to see if the symptoms will go away. Do not drive yourself to the hospital. Summary Asthma is a long-term (chronic) condition in which the airways get tight and narrow. An asthma attack can make it hard to breathe. Asthma cannot be cured, but medicines and lifestyle changes can help control it. Make sure you understand how to avoid triggers and how and when to use your medicines. Avoid things that can cause allergy symptoms (allergens). These include animal skin flakes (dander) and pollen from trees or grass. Avoid things that pollute the air. These may include household cleaners, wood smoke, smog, or chemical odors. This information is not intended to replace advice given to you by your health care provider. Make sure you discuss any questions you have with your health care provider. Document Revised: 11/25/2020 Document Reviewed: 11/25/2020 Elsevier Patient Education  2023 Elsevier Inc.  

## 2022-01-27 NOTE — Progress Notes (Signed)
[facility] [place], [place] [address]  Pulmonary Sleep Medicine   Office Visit Note  Patient Name: Danielle Delgado DOB: [birth year] MRN [id number]  Date of Service: 01/27/2022  Complaints/HPI: She is out of her asmanex because of the cost. She states she has not been using any of her inhalers. Her insurance cost is too much. She states she has no cough noted and no shortness of breath. Denies having any chest pain  ROS  General: (-) fever, (-) chills, (-) night sweats, (-) weakness Skin: (-) rashes, (-) itching,. Eyes: (-) visual changes, (-) redness, (-) itching. Nose and Sinuses: (-) nasal stuffiness or itchiness, (-) postnasal drip, (-) nosebleeds, (-) sinus trouble. Mouth and Throat: (-) sore throat, (-) hoarseness. Neck: (-) swollen glands, (-) enlarged thyroid, (-) neck pain. Respiratory: - cough, (-) bloody sputum, - shortness of breath, - wheezing. Cardiovascular: - ankle swelling, (-) chest pain. Lymphatic: (-) lymph node enlargement. Neurologic: (-) numbness, (-) tingling. Psychiatric: (-) anxiety, (-) depression   Current Medication: Outpatient Encounter Medications as of 01/27/2022  Medication Sig Note   ACCU-CHEK AVIVA PLUS test strip  01/17/2014: Received from: External Pharmacy   acetaminophen (TYLENOL) 650 MG CR tablet Take 650 mg by mouth every 8 (eight) hours as needed for pain.    albuterol (PROAIR HFA) 108 (90 Base) MCG/ACT inhaler Inhale 2 puffs into the lungs every 6 (six) hours as needed for wheezing or shortness of breath.    amLODipine (NORVASC) 5 MG tablet Take 5 mg by mouth daily.    aspirin EC 81 MG tablet Take 81 mg by mouth daily.    atorvastatin (LIPITOR) 10 MG tablet  01/17/2014: Received from: External Pharmacy   Biotin 5000 MCG TABS Take 1 tablet by mouth daily.    calcium carbonate (OS-CAL) 600 MG TABS tablet Take 600 mg by mouth 2 (two) times daily with a meal.    cetirizine (ZYRTEC) 10 MG tablet Take 10 mg by mouth  daily.    dapagliflozin propanediol (FARXIGA) 10 MG TABS tablet Take by mouth daily.    ferrous sulfate 325 (65 FE) MG tablet Take 325 mg by mouth daily with breakfast.    glipiZIDE (GLUCOTROL XL) 5 MG 24 hr tablet Take 5 mg by mouth daily with breakfast.    levothyroxine (SYNTHROID) 88 MCG tablet     levothyroxine (SYNTHROID) 88 MCG tablet     liothyronine (CYTOMEL) 5 MCG tablet Take 5 mcg by mouth daily.    losartan-hydrochlorothiazide (HYZAAR) 50-12.5 MG per tablet  01/17/2014: Received from: External Pharmacy   metFORMIN (GLUCOPHAGE) 1000 MG tablet Take by mouth. 11/06/2014: Received from: [place]   mometasone [facility]) 220 MCG/ACT inhaler Inhale 2 puffs into the lungs daily.    montelukast (SINGULAIR) 10 MG tablet TAKE 1 TABLET EVERY DAY    Omega-3 Fatty Acids (FISH OIL CONCENTRATE) 300 MG CAPS Take by mouth.  11/06/2014: Received from: [place]   pantoprazole (PROTONIX) 40 MG tablet TAKE 1 TABLET EVERY DAY    Turmeric Curcumin 500 MG CAPS Take by mouth.    levothyroxine (SYNTHROID) 125 MCG tablet  (Patient not taking: Reported on 12/30/2021)    Melatonin 1 MG TABS Take by mouth as needed.     No facility-administered encounter medications on file as of 01/27/2022.    Surgical History: Past Surgical History:  Procedure Laterality Date   BREAST EXCISIONAL BIOPSY Right 1996   neg   BREAST SURGERY  biopsy   CHOLECYSTECTOMY     COLONOSCOPY WITH PROPOFOL N/A 10/12/2014   Procedure: COLONOSCOPY WITH PROPOFOL;  Surgeon: Manya Silvas, MD;  Location: [facility] ENDOSCOPY;  Service: Endoscopy;  Laterality: N/A;   DILATION AND CURETTAGE OF UTERUS     ESOPHAGOGASTRODUODENOSCOPY (EGD) WITH PROPOFOL N/A 10/12/2014   Procedure: ESOPHAGOGASTRODUODENOSCOPY (EGD) WITH PROPOFOL;  Surgeon: Manya Silvas, MD;  Location: [facility] ENDOSCOPY;  Service: Endoscopy;  Laterality: N/A;   NASAL SINUS SURGERY     VAGINAL HYSTERECTOMY  1982    Medical History: Past  Medical History:  Diagnosis Date   Asthma    Chronic vaginitis    Diabetes mellitus without complication ([facility])    Dyspareunia in female    Eczema    FH: breast cancer    FH: colon cancer    GERD (gastroesophageal reflux disease)    Hiatus hernia syndrome    Hyperlipidemia    Hypertension    Hypothyroidism    Morbid obesity ([place])    Postmenopausal    Sleep apnea    Superficial varicosities    Vaginal atrophy     Family History: Family History  Problem Relation Age of Onset   Osteoporosis Mother    Breast cancer Maternal Aunt 60   Diabetes Maternal Grandmother    Diabetes Maternal Grandfather    Heart disease Maternal Grandfather    Colon cancer Cousin    Breast cancer Cousin 45       mat cousin   Ovarian cancer Neg Hx     Social History: Social History   Socioeconomic History   Marital status: Married    Spouse name: Not on file   Number of children: Not on file   Years of education: Not on file   Highest education level: Not on file  Occupational History   Not on file  Tobacco Use   Smoking status: Never   Smokeless tobacco: Never  Vaping Use   Vaping Use: Never used  Substance and Sexual Activity   Alcohol use: No    Alcohol/week: 0.0 standard drinks of alcohol   Drug use: No   Sexual activity: Not Currently  Other Topics Concern   Not on file  Social History Narrative   Not on file   Social Determinants of Health   Financial Resource Strain: Not on file  Food Insecurity: Not on file  Transportation Needs: Not on file  Physical Activity: Insufficiently Active (07/14/2017)   Exercise Vital Sign    Days of Exercise per Week: 2 days    Minutes of Exercise per Session: 30 min  Stress: Not on file  Social Connections: Not on file  Intimate Partner Violence: Not on file    Vital Signs: Blood pressure (!) 166/70, pulse 88, temperature 98 F (36.7 C), resp. rate 16, height '5\' 4"'$  (1.626 m), weight 219 lb 12.8 oz (99.7 kg), SpO2 99  %.  Examination: General Appearance: The patient is well-developed, well-nourished, and in no distress. Skin: Gross inspection of skin unremarkable. Head: normocephalic, no gross deformities. Eyes: no gross deformities noted. ENT: ears appear grossly normal no exudates. Neck: Supple. No thyromegaly. No LAD. Respiratory: no rhonchi noted. Cardiovascular: Normal S1 and S2 without murmur or rub. Extremities: No cyanosis. pulses are equal. Neurologic: Alert and oriented. No involuntary movements.  LABS: No results found for this or any previous visit (from the past 2160 hour(s)).  Radiology: MM 3D SCREEN BREAST BILATERAL  Result Date: 03/05/2021 CLINICAL DATA:  Screening. EXAM: DIGITAL SCREENING BILATERAL MAMMOGRAM  WITH TOMOSYNTHESIS AND CAD TECHNIQUE: Bilateral screening digital craniocaudal and mediolateral oblique mammograms were obtained. Bilateral screening digital breast tomosynthesis was performed. The images were evaluated with computer-aided detection. COMPARISON:  Previous exam(s). ACR Breast Density Category b: There are scattered areas of fibroglandular density. FINDINGS: There are no findings suspicious for malignancy. IMPRESSION: No mammographic evidence of malignancy. A result letter of this screening mammogram will be mailed directly to the patient. RECOMMENDATION: Screening mammogram in one year. (Code:SM-B-01Y) BI-RADS CATEGORY  1: Negative. Electronically Signed   By: Dorise Bullion III M.D.   On: 03/05/2021 17:15   No results found.  No results found.    Assessment and Plan: Patient Active Problem List   Diagnosis Date Noted   Leucocytosis 12/30/2021   Varicose veins with pain 11/02/2019   Intractable chronic migraine without aura and without status migrainosus 04/26/2019   Primary osteoarthritis of left knee 12/05/2018   Leg pain 10/19/2017   Nocturnal leg cramps 10/19/2017   Chronic venous insufficiency 03/02/2017   Lymphedema 03/02/2017   Increased BMI  07/10/2015   Pelvic pain in female 07/10/2015   Vaginal atrophy 07/10/2015   Status post vaginal hysterectomy 07/10/2015    1. SOB (shortness of breath) At baseline will continue with inhalers - Spirometry with graph  2. Chronic asthma, mild intermittent, uncomplicated She cannot afford her meds and will try to switch her to flovent if this is covered with her insurance  3. OSA on CPAP She is doing well but needs supplies because of her insurance the current company is not participating  4. Obesity, morbid ([place]) Obesity Counseling: Had a lengthy discussion regarding patients BMI and weight issues. Patient was instructed on portion control as well as increased activity. Also discussed caloric restrictions with trying to maintain intake less than 2000 Kcal. Discussions were made in accordance with the 5As of weight management. Simple actions such as not eating late and if able to, taking a walk is suggested.   5. CPAP use counseling  CPAP Counseling: had a lengthy discussion with the patient regarding the importance of PAP therapy in management of the sleep apnea. Patient appears to understand the risk factor reduction and also understands the risks associated with untreated sleep apnea.   General Counseling: I have discussed the findings of the evaluation and examination with Mariann Laster.  I have also discussed any further diagnostic evaluation thatmay be needed or ordered today. Samia verbalizes understanding of the findings of todays visit. We also reviewed her medications today and discussed drug interactions and side effects including but not limited excessive drowsiness and altered mental states. We also discussed that there is always a risk not just to her but also people around her. she has been encouraged to call the office with any questions or concerns that should arise related to todays visit.  Orders Placed This Encounter  Procedures   Spirometry with graph    Order Specific  Question:   Where should this test be performed?    Answer:   [facility]     Time spent: 33  I have personally obtained a history, examined the patient, evaluated laboratory and imaging results, formulated the assessment and plan and placed orders.    Allyne Gee, MD [facility] Pulmonary and Critical Care Sleep medicine

## 2022-02-18 ENCOUNTER — Ambulatory Visit: Payer: Medicare HMO | Admitting: Podiatry

## 2022-02-18 ENCOUNTER — Encounter: Payer: Self-pay | Admitting: Podiatry

## 2022-02-18 DIAGNOSIS — D2372 Other benign neoplasm of skin of left lower limb, including hip: Secondary | ICD-10-CM | POA: Diagnosis not present

## 2022-02-18 DIAGNOSIS — E119 Type 2 diabetes mellitus without complications: Secondary | ICD-10-CM | POA: Diagnosis not present

## 2022-02-18 NOTE — Progress Notes (Signed)
She presents today for 27-monthdiabetic checkup states that is doing really well no problems whatsoever occasionally gets a little numbness and tingling along the medial longitudinal arch.  The callus on the lateral aspect of the fifth metatarsal is tender.  States that she has been to pedicurist and tried to trim it down for her.  Objective: Vital signs are stable alert and oriented x 3 there is no erythema edema cellulitis drainage or odor.  Neurological Sorum is intact for Semmes-Weinstein monofilament Deetjen reflexes are intact muscle strength is normal symmetrical bilateral.  Orthopedic evaluation demonstrates all joints distal to the ankle and full range of motion without crepitation.  Mild benign skin lesion fifth metatarsal head left.  Assessment: Diabetes mellitus without complications.  Benign skin lesion subfifth met head left.  Plan: Debrided benign skin lesion for her today follow-up with her as needed

## 2022-03-09 ENCOUNTER — Ambulatory Visit
Admission: RE | Admit: 2022-03-09 | Discharge: 2022-03-09 | Disposition: A | Discharge status: Home / Self Care | Payer: Medicare HMO | Source: Ambulatory Visit | Attending: Obstetrics and Gynecology | Admitting: Obstetrics and Gynecology

## 2022-03-09 DIAGNOSIS — Z1231 Encounter for screening mammogram for malignant neoplasm of breast: Secondary | ICD-10-CM

## 2022-03-09 DIAGNOSIS — Z01419 Encounter for gynecological examination (general) (routine) without abnormal findings: Secondary | ICD-10-CM | POA: Diagnosis present

## 2022-06-15 ENCOUNTER — Other Ambulatory Visit: Payer: Self-pay | Admitting: Internal Medicine

## 2022-08-17 ENCOUNTER — Ambulatory Visit (INDEPENDENT_AMBULATORY_CARE_PROVIDER_SITE_OTHER): Payer: Medicare HMO | Admitting: Podiatry

## 2022-08-17 ENCOUNTER — Encounter: Payer: Self-pay | Admitting: Podiatry

## 2022-08-17 DIAGNOSIS — E119 Type 2 diabetes mellitus without complications: Secondary | ICD-10-CM

## 2022-08-17 NOTE — Progress Notes (Signed)
She presents today for follow-up of her diabetic foot.  She states that she is doing well she is really not having any problems with it every once in a while she gets some tightness or numbness around the big toe joint but that is just hit and miss.  Objective: Vital signs are stable she is alert and oriented x 3 pulses are palpable neurologic sensorium is intact per Semmes Weinstein monofilament and vibratory sensation.  Proprioceptive is in tact as well.  She has full strength dorsiflexors plantar flexors inverters everters all intrinsic musculature is intact.  Orthopedic evaluation of straits all joints distal to the ankle full range of motion without crepitation.  Cutaneous evaluation demonstrates supple well-hydrated cutis no open lesions or wounds.  Assessment: Early diabetic peripheral neuropathy symptoms only around the first metatarsophalangeal joint.  Plan: Follow-up with her in 3 to 6 months for another check.

## 2022-09-07 ENCOUNTER — Encounter: Payer: Self-pay | Admitting: *Deleted

## 2022-09-11 ENCOUNTER — Encounter: Payer: Self-pay | Admitting: *Deleted

## 2022-09-14 ENCOUNTER — Ambulatory Visit: Payer: Medicare HMO | Admitting: General Practice

## 2022-09-14 ENCOUNTER — Ambulatory Visit
Admission: RE | Admit: 2022-09-14 | Discharge: 2022-09-14 | Disposition: A | Discharge status: Home / Self Care | Payer: Medicare HMO | Attending: Gastroenterology | Admitting: Gastroenterology

## 2022-09-14 ENCOUNTER — Encounter
Admission: RE | Disposition: A | Discharge status: Home / Self Care | Payer: Self-pay | Source: Home / Self Care | Attending: Gastroenterology

## 2022-09-14 DIAGNOSIS — E669 Obesity, unspecified: Secondary | ICD-10-CM | POA: Diagnosis not present

## 2022-09-14 DIAGNOSIS — Z6841 Body Mass Index (BMI) 40.0 and over, adult: Secondary | ICD-10-CM | POA: Insufficient documentation

## 2022-09-14 DIAGNOSIS — K449 Diaphragmatic hernia without obstruction or gangrene: Secondary | ICD-10-CM | POA: Diagnosis not present

## 2022-09-14 DIAGNOSIS — Z83719 Family history of colon polyps, unspecified: Secondary | ICD-10-CM | POA: Diagnosis not present

## 2022-09-14 DIAGNOSIS — E785 Hyperlipidemia, unspecified: Secondary | ICD-10-CM | POA: Insufficient documentation

## 2022-09-14 DIAGNOSIS — K219 Gastro-esophageal reflux disease without esophagitis: Secondary | ICD-10-CM | POA: Insufficient documentation

## 2022-09-14 DIAGNOSIS — Z90710 Acquired absence of both cervix and uterus: Secondary | ICD-10-CM | POA: Insufficient documentation

## 2022-09-14 DIAGNOSIS — E039 Hypothyroidism, unspecified: Secondary | ICD-10-CM | POA: Insufficient documentation

## 2022-09-14 DIAGNOSIS — K2950 Unspecified chronic gastritis without bleeding: Secondary | ICD-10-CM | POA: Diagnosis not present

## 2022-09-14 DIAGNOSIS — I10 Essential (primary) hypertension: Secondary | ICD-10-CM | POA: Diagnosis not present

## 2022-09-14 DIAGNOSIS — K5730 Diverticulosis of large intestine without perforation or abscess without bleeding: Secondary | ICD-10-CM | POA: Diagnosis not present

## 2022-09-14 DIAGNOSIS — K640 First degree hemorrhoids: Secondary | ICD-10-CM | POA: Diagnosis not present

## 2022-09-14 DIAGNOSIS — Z1211 Encounter for screening for malignant neoplasm of colon: Secondary | ICD-10-CM | POA: Diagnosis not present

## 2022-09-14 DIAGNOSIS — K3189 Other diseases of stomach and duodenum: Secondary | ICD-10-CM | POA: Insufficient documentation

## 2022-09-14 HISTORY — PX: ESOPHAGOGASTRODUODENOSCOPY: SHX5428

## 2022-09-14 HISTORY — PX: COLONOSCOPY WITH PROPOFOL: SHX5780

## 2022-09-14 HISTORY — PX: BIOPSY: SHX5522

## 2022-09-14 LAB — GLUCOSE, CAPILLARY: Glucose-Capillary: 117 mg/dL — ABNORMAL HIGH (ref 70–99)

## 2022-09-14 SURGERY — COLONOSCOPY WITH PROPOFOL
Anesthesia: General

## 2022-09-14 MED ORDER — SODIUM CHLORIDE 0.9 % IV SOLN
INTRAVENOUS | Status: DC
  Administered 2022-09-14: 20 mL/h via INTRAVENOUS

## 2022-09-14 MED ORDER — LIDOCAINE HCL (CARDIAC) PF 100 MG/5ML IV SOSY
PREFILLED_SYRINGE | INTRAVENOUS | Status: DC | PRN
  Administered 2022-09-14: 100 mg via INTRAVENOUS

## 2022-09-14 MED ORDER — PROPOFOL 500 MG/50ML IV EMUL
INTRAVENOUS | Status: DC | PRN
  Administered 2022-09-14: 125 ug/kg/min via INTRAVENOUS

## 2022-09-14 MED ORDER — PROPOFOL 10 MG/ML IV BOLUS
INTRAVENOUS | Status: DC | PRN
Start: 2022-09-14 — End: 2022-09-14
  Administered 2022-09-14: 50 mg via INTRAVENOUS
  Administered 2022-09-14: 20 mg via INTRAVENOUS
  Administered 2022-09-14: 30 mg via INTRAVENOUS
  Administered 2022-09-14: 100 mg via INTRAVENOUS

## 2022-09-14 MED ORDER — LIDOCAINE HCL (PF) 2 % IJ SOLN
INTRAMUSCULAR | Status: AC
  Filled 2022-09-14: qty 5

## 2022-09-14 NOTE — Op Note (Signed)
[facility] Patient Name: Danielle Delgado Procedure Date: 09/14/2022 9:02 AM MRN: [id number] Account #: [id number] Date of Birth: 1950-10-17 Admit Type: Outpatient Age: 72 Room: [facility] ENDO ROOM 3 Gender: Female Note Status: Finalized Instrument Name: Upper Endoscope [id number] Procedure:             Upper GI endoscopy Indications:           Gastro-esophageal reflux disease Providers:             Eather Colas MD, MD Referring MD:          Sherrie Mustache, MD (Referring MD) Medicines:             Monitored Anesthesia Care Complications:         No immediate complications. Estimated blood loss:                         Minimal. Procedure:             Pre-Anesthesia Assessment:                        - Prior to the procedure, a History and Physical was                         performed, and patient medications and allergies were                         reviewed. The patient is competent. The risks and                         benefits of the procedure and the sedation options and                         risks were discussed with the patient. All questions                         were answered and informed consent was obtained.                         Patient identification and proposed procedure were                         verified by the physician, the nurse, the                         anesthesiologist, the anesthetist and the technician                         in the endoscopy suite. Mental Status Examination:                         alert and oriented. Airway Examination: normal                         oropharyngeal airway and neck mobility. Respiratory                         Examination: clear to auscultation. CV Examination:  normal. Prophylactic Antibiotics: The patient does not                         require prophylactic antibiotics. Prior                         Anticoagulants: The patient has taken no anticoagulant                          or antiplatelet agents. ASA Grade Assessment: III - A                         patient with severe systemic disease. After reviewing                         the risks and benefits, the patient was deemed in                         satisfactory condition to undergo the procedure. The                         anesthesia plan was to use monitored anesthesia care                         (MAC). Immediately prior to administration of                         medications, the patient was re-assessed for adequacy                         to receive sedatives. The heart rate, respiratory                         rate, oxygen saturations, blood pressure, adequacy of                         pulmonary ventilation, and response to care were                         monitored throughout the procedure. The physical                         status of the patient was re-assessed after the                         procedure.                        After obtaining informed consent, the endoscope was                         passed under direct vision. Throughout the procedure,                         the patient's blood pressure, pulse, and oxygen                         saturations were monitored continuously. The Endoscope  was introduced through the mouth, and advanced to the                         second part of duodenum. The upper GI endoscopy was                         accomplished without difficulty. The patient tolerated                         the procedure well. Findings:      A small hiatal hernia was present.      The exam of the esophagus was otherwise normal.      Patchy moderate inflammation characterized by erythema was found in the       gastric antrum. Biopsies were taken with a cold forceps for Helicobacter       pylori testing. Estimated blood loss was minimal.      The examined duodenum was normal. Impression:            - Small hiatal hernia.                         - Gastritis. Biopsied.                        - Normal examined duodenum. Recommendation:        - Discharge patient to home.                        - Resume previous diet.                        - Continue present medications.                        - Await pathology results.                        - Return to referring physician as previously                         scheduled. Procedure Code(s):     --- Professional ---                        [phone], Esophagogastroduodenoscopy, flexible,                         transoral; with biopsy, single or multiple Diagnosis Code(s):     --- Professional ---                        K44.9, Diaphragmatic hernia without obstruction or                         gangrene                        K29.70, Gastritis, unspecified, without bleeding                        K21.9, Gastro-esophageal reflux disease without  esophagitis CPT copyright 2022 American Medical Association. All rights reserved. The codes documented in this report are preliminary and upon coder review may  be revised to meet current compliance requirements. Eather Colas MD, MD 09/14/2022 9:37:04 AM Number of Addenda: 0 Note Initiated On: 09/14/2022 9:02 AM Estimated Blood Loss:  Estimated blood loss was minimal.      [facility]

## 2022-09-14 NOTE — Anesthesia Preprocedure Evaluation (Signed)
Anesthesia Evaluation  Patient identified by MRN, date of birth, ID band Patient awake    Reviewed: Allergy & Precautions, H&P , NPO status , Patient's Chart, lab work & pertinent test results, reviewed documented beta blocker date and time   History of Anesthesia Complications Negative for: history of anesthetic complications  Airway Mallampati: II  TM Distance: >3 FB Neck ROM: full    Dental no notable dental hx. (+) Teeth Intact, Dental Advidsory Given   Pulmonary neg pulmonary ROS, neg shortness of breath, asthma , sleep apnea and Continuous Positive Airway Pressure Ventilation , neg COPD, neg recent URI   Pulmonary exam normal breath sounds clear to auscultation       Cardiovascular Exercise Tolerance: Good hypertension, On Medications (-) angina (-) CAD, (-) Past MI and (-) CABG negative cardio ROS Normal cardiovascular exam(-) dysrhythmias (-) Valvular Problems/Murmurs Rhythm:regular Rate:Normal     Neuro/Psych negative neurological ROS  negative psych ROS   GI/Hepatic negative GI ROS, Neg liver ROS,GERD  Medicated,,  Endo/Other  negative endocrine ROSdiabetes, Well Controlled, Oral Hypoglycemic AgentsHypothyroidism  Morbid obesity  Renal/GU negative Renal ROS  negative genitourinary   Musculoskeletal   Abdominal   Peds  Hematology negative hematology ROS (+)   Anesthesia Other Findings Past Medical History: No date: Asthma No date: Chronic vaginitis No date: Diabetes mellitus without complication (HCC) No date: Dyspareunia in female No date: Eczema No date: FH: breast cancer No date: FH: colon cancer No date: GERD (gastroesophageal reflux disease) No date: Hiatus hernia syndrome No date: Hyperlipidemia No date: Hypertension No date: Hypothyroidism No date: Morbid obesity (HCC) No date: Postmenopausal No date: Sleep apnea No date: Superficial varicosities No date: Vaginal atrophy  Past Surgical  History: 1996: BREAST EXCISIONAL BIOPSY; Right     Comment:  neg No date: BREAST SURGERY     Comment:  biopsy No date: CHOLECYSTECTOMY 10/12/2014: COLONOSCOPY WITH PROPOFOL; N/A     Comment:  Procedure: COLONOSCOPY WITH PROPOFOL;  Surgeon: Scot Jun, MD;  Location: [facility] ENDOSCOPY;  Service:               Endoscopy;  Laterality: N/A; No date: DILATION AND CURETTAGE OF UTERUS 10/12/2014: ESOPHAGOGASTRODUODENOSCOPY (EGD) WITH PROPOFOL; N/A     Comment:  Procedure: ESOPHAGOGASTRODUODENOSCOPY (EGD) WITH               PROPOFOL;  Surgeon: Scot Jun, MD;  Location: [facility]              ENDOSCOPY;  Service: Endoscopy;  Laterality: N/A; No date: NASAL SINUS SURGERY 1982: VAGINAL HYSTERECTOMY  BMI    Body Mass Index: 40.21 kg/m      Reproductive/Obstetrics negative OB ROS                             Anesthesia Physical Anesthesia Plan  ASA: III  Anesthesia Plan: General   Post-op Pain Management: Minimal or no pain anticipated   Induction: Intravenous  PONV Risk Score and Plan: 3 and Ondansetron, Propofol infusion, TIVA and Treatment may vary due to age or medical condition  Airway Management Planned: Nasal Cannula and Natural Airway  Additional Equipment: None  Intra-op Plan:   Post-operative Plan:   Informed Consent: I have reviewed the patients History and Physical, chart, labs and discussed the procedure including the risks, benefits and alternatives for the proposed anesthesia with the patient or  authorized representative who has indicated his/her understanding and acceptance.     Dental Advisory Given  Plan Discussed with: Anesthesiologist, CRNA and Surgeon  Anesthesia Plan Comments: (Discussed risks of anesthesia with patient, including possibility of difficulty with spontaneous ventilation under anesthesia necessitating airway intervention, PONV, and rare risks such as cardiac or respiratory or neurological events,  and allergic reactions. Discussed the role of CRNA in patient's perioperative care. Patient understands.)        Anesthesia Quick Evaluation

## 2022-09-14 NOTE — Interval H&P Note (Signed)
History and Physical Interval Note:  09/14/2022 9:00 AM  Danielle Delgado  has presented today for surgery, with the diagnosis of FH colon polyps gerd.  The various methods of treatment have been discussed with the patient and family. After consideration of risks, benefits and other options for treatment, the patient has consented to  Procedure(s): COLONOSCOPY WITH PROPOFOL (N/A) ESOPHAGOGASTRODUODENOSCOPY (EGD) (N/A) as a surgical intervention.  The patient's history has been reviewed, patient examined, no change in status, stable for surgery.  I have reviewed the patient's chart and labs.  Questions were answered to the patient's satisfaction.     [name]  Ok to proceed with EGD/Colonoscopy

## 2022-09-14 NOTE — H&P (Signed)
Outpatient short stay form Pre-procedure 09/14/2022  [name], MD  Primary Physician: Sherrie Mustache, MD  Reason for visit:  GERD/Screening colonoscopy  History of present illness:    72 y/o lady with history of GERD, HLD, DM II, hypertension, hypothyroidism, and obesity here for EGD for GERD and colonoscopy for family history of polyps. Last colonoscopy in 2016 was unremarkable. No blood thinners. History of hysterectomy.    Current Facility-Administered Medications:    0.9 %  sodium chloride infusion, , Intravenous, Continuous, Camauri Fleece, Rossie Muskrat, MD, Last Rate: 20 mL/hr at 09/14/22 0834, 20 mL/hr at 09/14/22 0834  Medications Prior to Admission  Medication Sig Dispense Refill Last Dose   ACCU-CHEK AVIVA PLUS test strip    09/13/2022   acetaminophen (TYLENOL) 650 MG CR tablet Take 650 mg by mouth every 8 (eight) hours as needed for pain.   09/13/2022   albuterol (PROAIR HFA) 108 (90 Base) MCG/ACT inhaler Inhale 2 puffs into the lungs every 6 (six) hours as needed for wheezing or shortness of breath. 8 g 0 09/13/2022   aspirin EC 81 MG tablet Take 81 mg by mouth daily.   Past Week   atorvastatin (LIPITOR) 10 MG tablet    09/13/2022   Biotin 5000 MCG TABS Take 1 tablet by mouth daily.   09/13/2022   dapagliflozin propanediol (FARXIGA) 10 MG TABS tablet Take by mouth daily.   09/13/2022   ferrous sulfate 325 (65 FE) MG tablet Take 325 mg by mouth daily with breakfast.   09/13/2022   fluticasone (FLOVENT HFA) 110 MCG/ACT inhaler Inhale 2 puffs into the lungs 2 (two) times daily. 1 each 5 09/13/2022   glipiZIDE (GLUCOTROL XL) 5 MG 24 hr tablet Take 5 mg by mouth daily with breakfast.   Past Week   levothyroxine (SYNTHROID) 88 MCG tablet    09/13/2022   liothyronine (CYTOMEL) 5 MCG tablet Take 5 mcg by mouth daily.   09/13/2022   losartan-hydrochlorothiazide (HYZAAR) 50-12.5 MG per tablet    09/14/2022 at 0500   Melatonin 1 MG TABS Take by mouth as needed.    Past Week   metFORMIN  (GLUCOPHAGE) 1000 MG tablet Take by mouth.   Past Week   mometasone (ASMANEX) 220 MCG/ACT inhaler Inhale 2 puffs into the lungs daily. 3 each 5 Past Week   montelukast (SINGULAIR) 10 MG tablet TAKE 1 TABLET EVERY DAY 90 tablet 0 Past Week   Omega-3 Fatty Acids (FISH OIL CONCENTRATE) 300 MG CAPS Take by mouth.    Past Week   pantoprazole (PROTONIX) 40 MG tablet TAKE 1 TABLET EVERY DAY 90 tablet 1 09/13/2022   Turmeric Curcumin 500 MG CAPS Take by mouth.   Past Week   amLODipine (NORVASC) 5 MG tablet Take 5 mg by mouth daily. (Patient not taking: Reported on 09/14/2022)   Completed Course   calcium carbonate (OS-CAL) 600 MG TABS tablet Take 600 mg by mouth 2 (two) times daily with a meal.      cetirizine (ZYRTEC) 10 MG tablet Take 10 mg by mouth daily.      levothyroxine (SYNTHROID) 125 MCG tablet  (Patient not taking: Reported on 12/30/2021)      levothyroxine (SYNTHROID) 88 MCG tablet         Allergies  Allergen Reactions   Cisapride Hives   Sulfa Antibiotics Hives   Biaxin [Clarithromycin]      Past Medical History:  Diagnosis Date   Asthma    Chronic vaginitis    Diabetes mellitus without  complication (HCC)    Dyspareunia in female    Eczema    FH: breast cancer    FH: colon cancer    GERD (gastroesophageal reflux disease)    Hiatus hernia syndrome    Hyperlipidemia    Hypertension    Hypothyroidism    Morbid obesity (HCC)    Postmenopausal    Sleep apnea    Superficial varicosities    Vaginal atrophy     Review of systems:  Otherwise negative.    Physical Exam  Gen: Alert, oriented. Appears stated age.  HEENT: PERRLA. Lungs: No respiratory distress CV: RRR Abd: soft, benign, no masses Ext: No edema    Planned procedures: Proceed with EGD/colonoscopy. The patient understands the nature of the planned procedure, indications, risks, alternatives and potential complications including but not limited to bleeding, infection, perforation, damage to internal organs  and possible oversedation/side effects from anesthesia. The patient agrees and gives consent to proceed.  Please refer to procedure notes for findings, recommendations and patient disposition/instructions.     [name], MD [facility]

## 2022-09-14 NOTE — Op Note (Signed)
[facility] Patient Name: Danielle Delgado Procedure Date: 09/14/2022 9:01 AM MRN: [id number] Account #: [id number] Date of Birth: 12-14-1950 Admit Type: Outpatient Age: 72 Room: [facility] ENDO ROOM 3 Gender: Female Note Status: Finalized Instrument Name: Prentice Docker [id number] Procedure:             Colonoscopy Indications:           Colon cancer screening in patient at increased risk:                         Family history of 1st-degree relative with colon polyps Providers:             Eather Colas MD, MD Referring MD:          Sherrie Mustache, MD (Referring MD) Medicines:             Monitored Anesthesia Care Complications:         No immediate complications. Procedure:             Pre-Anesthesia Assessment:                        - Prior to the procedure, a History and Physical was                         performed, and patient medications and allergies were                         reviewed. The patient is competent. The risks and                         benefits of the procedure and the sedation options and                         risks were discussed with the patient. All questions                         were answered and informed consent was obtained.                         Patient identification and proposed procedure were                         verified by the physician, the nurse, the                         anesthesiologist, the anesthetist and the technician                         in the endoscopy suite. Mental Status Examination:                         alert and oriented. Airway Examination: normal                         oropharyngeal airway and neck mobility. Respiratory                         Examination: clear to auscultation. CV Examination:  normal. Prophylactic Antibiotics: The patient does not                         require prophylactic antibiotics. Prior                         Anticoagulants: The patient  has taken no anticoagulant                         or antiplatelet agents. ASA Grade Assessment: III - A                         patient with severe systemic disease. After reviewing                         the risks and benefits, the patient was deemed in                         satisfactory condition to undergo the procedure. The                         anesthesia plan was to use monitored anesthesia care                         (MAC). Immediately prior to administration of                         medications, the patient was re-assessed for adequacy                         to receive sedatives. The heart rate, respiratory                         rate, oxygen saturations, blood pressure, adequacy of                         pulmonary ventilation, and response to care were                         monitored throughout the procedure. The physical                         status of the patient was re-assessed after the                         procedure.                        After obtaining informed consent, the colonoscope was                         passed under direct vision. Throughout the procedure,                         the patient's blood pressure, pulse, and oxygen                         saturations were monitored continuously. The  Colonoscope was introduced through the anus and                         advanced to the the cecum, identified by appendiceal                         orifice and ileocecal valve. The colonoscopy was                         performed without difficulty. The patient tolerated                         the procedure well. The quality of the bowel                         preparation was adequate to identify polyps. The                         ileocecal valve, appendiceal orifice, and rectum were                         photographed. Findings:      The perianal and digital rectal examinations were normal.      Scattered small-mouthed  diverticula were found in the sigmoid colon,       descending colon and proximal transverse colon.      Internal hemorrhoids were found during retroflexion. The hemorrhoids       were Grade I (internal hemorrhoids that do not prolapse).      The exam was otherwise without abnormality on direct and retroflexion       views. Impression:            - Diverticulosis in the sigmoid colon, in the                         descending colon and in the proximal transverse colon.                        - Internal hemorrhoids.                        - The examination was otherwise normal on direct and                         retroflexion views.                        - No specimens collected. Recommendation:        - Discharge patient to home.                        - Resume previous diet.                        - Continue present medications.                        - Repeat colonoscopy is not recommended due to current                         age (34 years or  older) for screening purposes.                        - Return to referring physician as previously                         scheduled. Procedure Code(s):     --- Professional ---                        X3244, Colorectal cancer screening; colonoscopy on                         individual at high risk Diagnosis Code(s):     --- Professional ---                        Z83.71, Family history of colonic polyps                        K64.0, First degree hemorrhoids                        K57.30, Diverticulosis of large intestine without                         perforation or abscess without bleeding CPT copyright 2022 American Medical Association. All rights reserved. The codes documented in this report are preliminary and upon coder review may  be revised to meet current compliance requirements. Eather Colas MD, MD 09/14/2022 9:40:36 AM Number of Addenda: 0 Note Initiated On: 09/14/2022 9:01 AM Scope Withdrawal Time: 0 hours 8 minutes 48  seconds  Total Procedure Duration: 0 hours 13 minutes 19 seconds  Estimated Blood Loss:  Estimated blood loss: none.      [facility]

## 2022-09-14 NOTE — Anesthesia Postprocedure Evaluation (Signed)
Anesthesia Post Note  Patient: Danielle Delgado  Procedure(s) Performed: COLONOSCOPY WITH PROPOFOL ESOPHAGOGASTRODUODENOSCOPY (EGD) BIOPSY  Patient location during evaluation: Endoscopy Anesthesia Type: General Level of consciousness: awake and alert Pain management: pain level controlled Vital Signs Assessment: post-procedure vital signs reviewed and stable Respiratory status: spontaneous breathing, nonlabored ventilation, respiratory function stable and patient connected to nasal cannula oxygen Cardiovascular status: blood pressure returned to baseline and stable Postop Assessment: no apparent nausea or vomiting Anesthetic complications: no  No notable events documented.   Last Vitals:  Vitals:   09/14/22 0937 09/14/22 0947  BP: 127/73 (!) 147/92  Pulse: 90   Resp: 17   Temp: (!) 36.4 C   SpO2: 96%     Last Pain:  Vitals:   09/14/22 0957  TempSrc:   PainSc: 0-No pain                 Stephanie Coup

## 2022-09-14 NOTE — Transfer of Care (Signed)
Immediate Anesthesia Transfer of Care Note  Patient: Danielle Delgado  Procedure(s) Performed: COLONOSCOPY WITH PROPOFOL ESOPHAGOGASTRODUODENOSCOPY (EGD) BIOPSY  Patient Location: PACU  Anesthesia Type:General  Level of Consciousness: awake, alert , and oriented  Airway & Oxygen Therapy: Patient Spontanous Breathing  Post-op Assessment: Report given to RN and Post -op Vital signs reviewed and stable  Post vital signs: Reviewed and stable  Last Vitals:  Vitals Value Taken Time  BP 127/73 09/14/22 0937  Temp    Pulse 79 09/14/22 0937  Resp 16 09/14/22 0937  SpO2 96 % 09/14/22 0937  Vitals shown include unfiled device data.  Last Pain:  Vitals:   09/14/22 0818  TempSrc: Temporal  PainSc: 7          Complications: No notable events documented.

## 2022-09-15 ENCOUNTER — Encounter: Payer: Self-pay | Admitting: Gastroenterology

## 2022-09-18 ENCOUNTER — Other Ambulatory Visit: Payer: Self-pay | Admitting: Internal Medicine

## 2023-02-09 ENCOUNTER — Ambulatory Visit: Payer: Medicare HMO | Admitting: Internal Medicine

## 2023-02-15 ENCOUNTER — Encounter: Payer: Self-pay | Admitting: Nurse Practitioner

## 2023-02-15 ENCOUNTER — Ambulatory Visit: Payer: Medicare HMO | Admitting: Podiatry

## 2023-02-15 ENCOUNTER — Ambulatory Visit: Payer: Medicare HMO | Admitting: Nurse Practitioner

## 2023-02-15 ENCOUNTER — Encounter: Payer: Self-pay | Admitting: Podiatry

## 2023-02-15 VITALS — BP 144/78 | HR 89 | Temp 97.5°F | Resp 16 | Ht 63.0 in | Wt 222.8 lb

## 2023-02-15 DIAGNOSIS — D2371 Other benign neoplasm of skin of right lower limb, including hip: Secondary | ICD-10-CM

## 2023-02-15 DIAGNOSIS — G4733 Obstructive sleep apnea (adult) (pediatric): Secondary | ICD-10-CM | POA: Diagnosis not present

## 2023-02-15 DIAGNOSIS — J4520 Mild intermittent asthma, uncomplicated: Secondary | ICD-10-CM | POA: Diagnosis not present

## 2023-02-15 DIAGNOSIS — E119 Type 2 diabetes mellitus without complications: Secondary | ICD-10-CM | POA: Diagnosis not present

## 2023-02-15 DIAGNOSIS — D2372 Other benign neoplasm of skin of left lower limb, including hip: Secondary | ICD-10-CM

## 2023-02-15 DIAGNOSIS — M79676 Pain in unspecified toe(s): Secondary | ICD-10-CM

## 2023-02-15 DIAGNOSIS — Z7189 Other specified counseling: Secondary | ICD-10-CM | POA: Insufficient documentation

## 2023-02-15 DIAGNOSIS — B351 Tinea unguium: Secondary | ICD-10-CM

## 2023-02-15 MED ORDER — ALBUTEROL SULFATE HFA 108 (90 BASE) MCG/ACT IN AERS: 2.0000 | INHALATION_SPRAY | Freq: Four times a day (QID) | RESPIRATORY_TRACT | Status: AC | PRN

## 2023-02-15 NOTE — Progress Notes (Signed)
She presents today chief complaint of painful elongated toenails and no diabetic issues.  States that she is doing pretty good with nothing major going on at this point.  Objective: Vital signs are stable alert oriented x 3.  Pulses are palpable.  Toenails are long thick yellow dystrophic sharply incurvated margins.  She does have multiple benign skin lesions bilateral.  No open lesions or wounds.  Assessment: Diabetes mellitus noncomplicated.  Plan debridement of toenails debridement of thick tissue and benign skin lesion.  Follow-up with me as needed

## 2023-02-15 NOTE — Progress Notes (Signed)
[facility] [address] [place], [place] [address]  Internal MEDICINE  Office Visit Note  Patient Name: Danielle Delgado  [id number]  [id number]  Date of Service: 02/15/2023  Chief Complaint  Patient presents with   Follow-up    HPI Milyn presents for a follow-up visit for asthma and OSA on CPAP Asthma -- uses a rescue inhaler as needed. Reports using it once or twice over the past year. Spirometry done last year was normal. Defer PFT until next year or if symptoms worsen.  OSA on CPAP -- doing well, sleep well, wakes up well-rested. No issues, does not need any supplies. No CPAP download available. Epworth SS score 1 out of 24.  EPWORTH SLEEPINESS SCALE: Scale: (0)= no chance of dozing; (1)= slight chance of dozing; (2)= moderate chance of dozing; (3)= high chance of dozing  Chance  Situtation Sitting and reading: 0 Watching TV: 0 Sitting Inactive in public: 0 As a passenger in car: 1   Lying down to rest: 0 Sitting and talking: 0 Sitting quielty after lunch: 0 In a car, stopped in traffic: 0 TOTAL SCORE:   1 out of 24     Current Medication: Outpatient Encounter Medications as of 02/15/2023  Medication Sig Note   ACCU-CHEK AVIVA PLUS test strip  01/17/2014: Received from: External Pharmacy   acetaminophen (TYLENOL) 650 MG CR tablet Take 650 mg by mouth every 8 (eight) hours as needed for pain.    albuterol (VENTOLIN HFA) 108 (90 Base) MCG/ACT inhaler Inhale 2 puffs into the lungs every 6 (six) hours as needed for wheezing or shortness of breath.    amLODipine (NORVASC) 5 MG tablet Take 5 mg by mouth daily.    aspirin EC 81 MG tablet Take 81 mg by mouth daily.    atorvastatin (LIPITOR) 10 MG tablet  01/17/2014: Received from: External Pharmacy   Biotin 5000 MCG TABS Take 1 tablet by mouth daily.    calcium carbonate (OS-CAL) 600 MG TABS tablet Take 600 mg by mouth 2 (two) times daily with a meal.    cetirizine (ZYRTEC) 10 MG tablet Take 10 mg by mouth  daily.    dapagliflozin propanediol (FARXIGA) 10 MG TABS tablet Take by mouth daily.    ferrous sulfate 325 (65 FE) MG tablet Take 325 mg by mouth daily with breakfast.    fluticasone (FLOVENT HFA) 110 MCG/ACT inhaler Inhale 2 puffs into the lungs 2 (two) times daily.    glipiZIDE (GLUCOTROL XL) 5 MG 24 hr tablet Take 5 mg by mouth daily with breakfast.    losartan-hydrochlorothiazide (HYZAAR) 50-12.5 MG per tablet  01/17/2014: Received from: External Pharmacy   metFORMIN (GLUCOPHAGE) 1000 MG tablet Take by mouth. 11/06/2014: Received from: [facility]   montelukast (SINGULAIR) 10 MG tablet TAKE 1 TABLET EVERY DAY    Omega-3 Fatty Acids (FISH OIL CONCENTRATE) 300 MG CAPS Take by mouth. 11/06/2014: Received from: [facility]   pantoprazole (PROTONIX) 40 MG tablet TAKE 1 TABLET EVERY DAY    Turmeric Curcumin 500 MG CAPS Take by mouth.    [DISCONTINUED] albuterol (PROAIR HFA) 108 (90 Base) MCG/ACT inhaler Inhale 2 puffs into the lungs every 6 (six) hours as needed for wheezing or shortness of breath.    [DISCONTINUED] levothyroxine (SYNTHROID) 125 MCG tablet  (Patient not taking: Reported on 12/30/2021)    [DISCONTINUED] levothyroxine (SYNTHROID) 88 MCG tablet     [DISCONTINUED] levothyroxine (SYNTHROID) 88 MCG tablet     [DISCONTINUED] liothyronine (CYTOMEL)  5 MCG tablet Take 5 mcg by mouth daily.    [DISCONTINUED] Melatonin 1 MG TABS Take by mouth as needed.     [DISCONTINUED] mometasone (ASMANEX) 220 MCG/ACT inhaler Inhale 2 puffs into the lungs daily.    No facility-administered encounter medications on file as of 02/15/2023.    Surgical History: Past Surgical History:  Procedure Laterality Date   BIOPSY  09/14/2022   Procedure: BIOPSY;  Surgeon: [name], MD;  Location: [facility] ENDOSCOPY;  Service: Endoscopy;;   BREAST EXCISIONAL BIOPSY Right 1996   neg   BREAST SURGERY     biopsy   CHOLECYSTECTOMY     COLONOSCOPY WITH PROPOFOL N/A 10/12/2014    Procedure: COLONOSCOPY WITH PROPOFOL;  Surgeon: Scot Jun, MD;  Location: [facility] ENDOSCOPY;  Service: Endoscopy;  Laterality: N/A;   COLONOSCOPY WITH PROPOFOL N/A 09/14/2022   Procedure: COLONOSCOPY WITH PROPOFOL;  Surgeon: [name], MD;  Location: [facility] ENDOSCOPY;  Service: Endoscopy;  Laterality: N/A;   DILATION AND CURETTAGE OF UTERUS     ESOPHAGOGASTRODUODENOSCOPY N/A 09/14/2022   Procedure: ESOPHAGOGASTRODUODENOSCOPY (EGD);  Surgeon: [name], MD;  Location: [facility] ENDOSCOPY;  Service: Endoscopy;  Laterality: N/A;   ESOPHAGOGASTRODUODENOSCOPY (EGD) WITH PROPOFOL N/A 10/12/2014   Procedure: ESOPHAGOGASTRODUODENOSCOPY (EGD) WITH PROPOFOL;  Surgeon: Scot Jun, MD;  Location: [facility] ENDOSCOPY;  Service: Endoscopy;  Laterality: N/A;   NASAL SINUS SURGERY     VAGINAL HYSTERECTOMY  1982    Medical History: Past Medical History:  Diagnosis Date   Asthma    Chronic vaginitis    Diabetes mellitus without complication (HCC)    Dyspareunia in female    Eczema    FH: breast cancer    FH: colon cancer    GERD (gastroesophageal reflux disease)    Hiatus hernia syndrome    Hyperlipidemia    Hypertension    Hypothyroidism    Morbid obesity (HCC)    Postmenopausal    Sleep apnea    Superficial varicosities    Vaginal atrophy     Family History: Family History  Problem Relation Age of Onset   Osteoporosis Mother    Breast cancer Maternal Aunt 60   Diabetes Maternal Grandmother    Diabetes Maternal Grandfather    Heart disease Maternal Grandfather    Colon cancer Cousin    Breast cancer Cousin 40       mat cousin   Ovarian cancer Neg Hx     Social History   Socioeconomic History   Marital status: Married    Spouse name: Not on file   Number of children: Not on file   Years of education: Not on file   Highest education level: Not on file  Occupational History   Not on file  Tobacco Use   Smoking status: Never   Smokeless tobacco: Never   Vaping Use   Vaping status: Never Used  Substance and Sexual Activity   Alcohol use: No    Alcohol/week: 0.0 standard drinks of alcohol   Drug use: No   Sexual activity: Not Currently  Other Topics Concern   Not on file  Social History Narrative   Not on file   Social Drivers of Health   Financial Resource Strain: Not on file  Food Insecurity: Not on file  Transportation Needs: Not on file  Physical Activity: Insufficiently Active (07/14/2017)   Exercise Vital Sign    Days of Exercise per Week: 2 days    Minutes of Exercise per Session: 30 min  Stress: Not on file  Social Connections: Not on file  Intimate Partner Violence: Not on file      Review of Systems  Constitutional: Negative.  Negative for fatigue.  HENT: Negative.    Respiratory: Negative.  Negative for cough, chest tightness, shortness of breath and wheezing.   Cardiovascular: Negative.  Negative for chest pain and palpitations.  Neurological: Negative.   Psychiatric/Behavioral: Negative.      Vital Signs: BP (!) 144/78   Pulse 89   Temp (!) 97.5 F (36.4 C)   Resp 16   Ht 5\' 3"  (1.6 m)   Wt 222 lb 12.8 oz (101.1 kg)   SpO2 98%   BMI 39.47 kg/m    Physical Exam Vitals reviewed.  Constitutional:      General: She is not in acute distress.    Appearance: Normal appearance. She is obese. She is not ill-appearing.  HENT:     Head: Normocephalic and atraumatic.  Eyes:     Pupils: Pupils are equal, round, and reactive to Ertel.  Cardiovascular:     Rate and Rhythm: Normal rate and regular rhythm.     Heart sounds: Normal heart sounds. No murmur heard. Pulmonary:     Effort: Pulmonary effort is normal. No respiratory distress.     Breath sounds: Normal breath sounds. No wheezing.  Neurological:     Mental Status: She is alert and oriented to person, place, and time.  Psychiatric:        Mood and Affect: Mood normal.        Behavior: Behavior normal.        Assessment/Plan: 1. Chronic  asthma, mild intermittent, uncomplicated (Primary) Continue prn albuterol inhaler use as prescribed.   2. OSA on CPAP Continue CPAP use as instructed.   3. Obesity, morbid (HCC) Weight loss may improve OSA. Physical activity and diet modifications to limit carbohydrates may help with weight loss and maintaining control of glucose levels related to her diabetes.   4. CPAP use counseling No questions or concerns about CPAP use or maintenance. Does not need any supplies at this time.    General Counseling: phylliss manieri understanding of the findings of todays visit and agrees with plan of treatment. I have discussed any further diagnostic evaluation that may be needed or ordered today. We also reviewed her medications today. she has been encouraged to call the office with any questions or concerns that should arise related to todays visit.    No orders of the defined types were placed in this encounter.   Meds ordered this encounter  Medications   albuterol (VENTOLIN HFA) 108 (90 Base) MCG/ACT inhaler    Sig: Inhale 2 puffs into the lungs every 6 (six) hours as needed for wheezing or shortness of breath.    Return in about 1 year (around 02/15/2024) for F/U, pulmonary/sleep, [facility] or DSK.   Total time spent:30 Minutes Time spent includes review of chart, medications, test results, and follow up plan with the patient.   [place] Controlled Substance Database was reviewed by me.  This patient was seen by Sallyanne Kuster, FNP-C in collaboration with Dr. Beverely Risen as a part of collaborative care agreement.   Vivi Piccirilli R. Tedd Sias, MSN, FNP-C Internal medicine

## 2023-08-16 ENCOUNTER — Encounter: Payer: Self-pay | Admitting: Podiatry

## 2023-08-16 ENCOUNTER — Ambulatory Visit: Payer: Medicare HMO | Admitting: Podiatry

## 2023-08-16 DIAGNOSIS — E119 Type 2 diabetes mellitus without complications: Secondary | ICD-10-CM | POA: Diagnosis not present

## 2023-08-16 DIAGNOSIS — B351 Tinea unguium: Secondary | ICD-10-CM

## 2023-08-16 DIAGNOSIS — M79676 Pain in unspecified toe(s): Secondary | ICD-10-CM

## 2023-08-16 NOTE — Progress Notes (Signed)
 She presents today for follow-up of her diabetic foot.  She states that she really has no complaints she is here for the exam and maybe have her nails cut.  Objective: Vital signs are stable alert oriented x 3.  There is no erythema edema cellulitis drainage or odor her nails are slightly elongated they recently trimmed.  Marginally tender.  No change in neurologic sensorium which is intact per [facility] monofilament.  Deep tendon reflexes are intact muscle strength is normal and symmetrical.  Orthopedic evaluation demonstrates all joints distal to the ankle full range of motion without crepitation.  Cutaneous evaluation does demonstrate slightly elongated nails.  Assessment: Diabetes mellitus without complications bilateral lower extremity.  Pain limb secondary to dystrophic nails.  Plan: Debridement of dystrophic nails follow-up with her in 6 months

## 2023-09-15 ENCOUNTER — Ambulatory Visit: Admitting: Obstetrics and Gynecology

## 2023-09-15 ENCOUNTER — Encounter: Payer: Self-pay | Admitting: Obstetrics and Gynecology

## 2023-09-15 VITALS — BP 124/79 | HR 80 | Ht 63.0 in | Wt 227.4 lb

## 2023-09-15 DIAGNOSIS — Z01419 Encounter for gynecological examination (general) (routine) without abnormal findings: Secondary | ICD-10-CM

## 2023-09-15 DIAGNOSIS — Z1231 Encounter for screening mammogram for malignant neoplasm of breast: Secondary | ICD-10-CM

## 2023-09-15 NOTE — Progress Notes (Signed)
 HPI:      [name] is a 73 y.o. G1P0101 who LMP was No LMP recorded. Patient has had a hysterectomy.  Subjective:   She presents today for her annual examination.  She has no complaints.  She has previously had a hysterectomy for abnormal cervical cells in 1981.  She has had normal Paps of the vaginal cuff for many years after that.    Hx: The following portions of the patient's history were reviewed and updated as appropriate:             She  has a past medical history of Allergy, Asthma, Chronic vaginitis, Diabetes mellitus without complication (HCC), Dyspareunia in female, Eczema, FH: breast cancer, FH: colon cancer, GERD (gastroesophageal reflux disease), Hiatus hernia syndrome, Hyperlipidemia, Hypertension, Hypothyroidism, Morbid obesity (HCC), Postmenopausal, Sleep apnea, Superficial varicosities, and Vaginal atrophy. She does not have any pertinent problems on file. She  has a past surgical history that includes Cholecystectomy; Colonoscopy with propofol  (N/A, 10/12/2014); Esophagogastroduodenoscopy (egd) with propofol  (N/A, 10/12/2014); Nasal sinus surgery; Breast surgery; Vaginal hysterectomy (1982); Dilation and curettage of uterus; Breast excisional biopsy (Right, 1996); Colonoscopy with propofol  (N/A, 09/14/2022); Esophagogastroduodenoscopy (N/A, 09/14/2022); and biopsy (09/14/2022). Her family history includes Breast cancer (age of onset: 33) in her cousin; Breast cancer (age of onset: 104) in her maternal aunt; Cancer in her father; Colon cancer in her cousin; Diabetes in her maternal grandfather and maternal grandmother; Heart disease in her maternal grandfather; Osteoporosis in her mother. She  reports that she has never smoked. She has never used smokeless tobacco. She reports that she does not drink alcohol and does not use drugs. She has a current medication list which includes the following prescription(s): accu-chek aviva plus, acetaminophen, albuterol , dropsafe alcohol  prep, amlodipine, aspirin ec, atorvastatin, biotin, calcium carbonate, cetirizine, dapagliflozin propanediol, ferrous sulfate, fluticasone , glipizide, levothyroxine, losartan-hydrochlorothiazide, metformin, mometasone , montelukast , fish oil concentrate, pantoprazole , and turmeric curcumin. She is allergic to cisapride, sulfa antibiotics, and biaxin [clarithromycin].       Review of Systems:  Review of Systems  Constitutional: Denied constitutional symptoms, night sweats, recent illness, fatigue, fever, insomnia and weight loss.  Eyes: Denied eye symptoms, eye pain, photophobia, vision change and visual disturbance.  Ears/Nose/Throat/Neck: Denied ear, nose, throat or neck symptoms, hearing loss, nasal discharge, sinus congestion and sore throat.  Cardiovascular: Denied cardiovascular symptoms, arrhythmia, chest pain/pressure, edema, exercise intolerance, orthopnea and palpitations.  Respiratory: Denied pulmonary symptoms, asthma, pleuritic pain, productive sputum, cough, dyspnea and wheezing.  Gastrointestinal: Denied, gastro-esophageal reflux, melena, nausea and vomiting.  Genitourinary: Denied genitourinary symptoms including symptomatic vaginal discharge, pelvic relaxation issues, and urinary complaints.  Musculoskeletal: Denied musculoskeletal symptoms, stiffness, swelling, muscle weakness and myalgia.  Dermatologic: Denied dermatology symptoms, rash and scar.  Neurologic: Denied neurology symptoms, dizziness, headache, neck pain and syncope.  Psychiatric: Denied psychiatric symptoms, anxiety and depression.  Endocrine: Denied endocrine symptoms including hot flashes and night sweats.   Meds:   Current Outpatient Medications on File Prior to Visit  Medication Sig Dispense Refill   ACCU-CHEK AVIVA PLUS test strip      acetaminophen (TYLENOL) 650 MG CR tablet Take 650 mg by mouth every 8 (eight) hours as needed for pain.     albuterol  (VENTOLIN  HFA) 108 (90 Base) MCG/ACT inhaler Inhale 2  puffs into the lungs every 6 (six) hours as needed for wheezing or shortness of breath.     Alcohol Swabs (DROPSAFE ALCOHOL PREP) 70 % PADS      amLODipine (NORVASC) 5 MG  tablet Take 5 mg by mouth daily.     aspirin EC 81 MG tablet Take 81 mg by mouth daily.     atorvastatin (LIPITOR) 10 MG tablet      Biotin 5000 MCG TABS Take 1 tablet by mouth daily.     calcium carbonate (OS-CAL) 600 MG TABS tablet Take 600 mg by mouth 2 (two) times daily with a meal.     cetirizine (ZYRTEC) 10 MG tablet Take 10 mg by mouth daily.     dapagliflozin propanediol (FARXIGA) 10 MG TABS tablet Take by mouth daily.     ferrous sulfate 325 (65 FE) MG tablet Take 325 mg by mouth daily with breakfast.     fluticasone  (FLOVENT  HFA) 110 MCG/ACT inhaler Inhale 2 puffs into the lungs 2 (two) times daily. 1 each 5   glipiZIDE (GLUCOTROL XL) 5 MG 24 hr tablet Take 5 mg by mouth daily with breakfast.     levothyroxine (SYNTHROID) 125 MCG tablet Take 125 mcg by mouth daily.     losartan-hydrochlorothiazide (HYZAAR) 50-12.5 MG per tablet      metFORMIN (GLUCOPHAGE) 1000 MG tablet Take by mouth.     mometasone  (ASMANEX ) 220 MCG/ACT inhaler Inhale 1 puff into the lungs.     montelukast  (SINGULAIR ) 10 MG tablet TAKE 1 TABLET EVERY DAY 90 tablet 3   Omega-3 Fatty Acids (FISH OIL CONCENTRATE) 300 MG CAPS Take by mouth.     pantoprazole  (PROTONIX ) 40 MG tablet TAKE 1 TABLET EVERY DAY 90 tablet 3   Turmeric Curcumin 500 MG CAPS Take by mouth.     No current facility-administered medications on file prior to visit.     Objective:     Vitals:   09/15/23 1456  BP: 124/79  Pulse: 80    Filed Weights   09/15/23 1456  Weight: 227 lb 6.4 oz (103.1 kg)              Physical examination General NAD, Conversant  HEENT Atraumatic; Op clear with mmm.  Normo-cephalic.  Anicteric sclerae  Thyroid/Neck Smooth without nodularity or enlargement. Normal ROM.  Neck Supple.  Skin No rashes, lesions or ulceration. Normal palpated skin  turgor. No nodularity.  Breasts: No masses or discharge.  Symmetric.  No axillary adenopathy.  Lungs: Clear to auscultation.No rales or wheezes. Normal Respiratory effort, no retractions.  Heart: NSR.  No murmurs or rubs appreciated. No peripheral edema  Abdomen: Soft.  Non-tender.  No masses.  No HSM. No hernia  Extremities: Moves all appropriately.  Normal ROM for age. No lymphadenopathy.  Neuro: Oriented to PPT.  Normal mood. Normal affect.     Pelvic: Deferred    Assessment:    G1P0101 Patient Active Problem List   Diagnosis Date Noted   Chronic asthma, mild intermittent, uncomplicated 02/15/2023   OSA on CPAP 02/15/2023   Obesity, morbid (HCC) 02/15/2023   CPAP use counseling 02/15/2023   Leucocytosis 12/30/2021   Varicose veins with pain 11/02/2019   Intractable chronic migraine without aura and without status migrainosus 04/26/2019   Primary osteoarthritis of left knee 12/05/2018   Leg pain 10/19/2017   Nocturnal leg cramps 10/19/2017   Chronic venous insufficiency 03/02/2017   Lymphedema 03/02/2017   Increased BMI 07/10/2015   Pelvic pain in female 07/10/2015   Vaginal atrophy 07/10/2015   Status post vaginal hysterectomy 07/10/2015     1. Well woman exam with routine gynecological exam   2. Screening mammogram for breast cancer  Plan:            1.  Basic Screening Recommendations The basic screening recommendations for asymptomatic women were discussed with the patient during her visit.  The age-appropriate recommendations were discussed with her and the rational for the tests reviewed.  When I am informed by the patient that another primary care physician has previously obtained the age-appropriate tests and they are up-to-date, only outstanding tests are ordered and referrals given as necessary.  Abnormal results of tests will be discussed with her when all of her results are completed.  Routine preventative health maintenance measures emphasized:  Exercise/Diet/Weight control, Tobacco Warnings, Alcohol/Substance use risks and Stress Management Discussed most recent Pap recommendations and mammogram recommendations.  Mammogram scheduled.  Orders Orders Placed This Encounter  Procedures   MM DIGITAL SCREENING BILATERAL    No orders of the defined types were placed in this encounter.         F/U  Return in about 1 year (around 09/14/2024) for Annual Physical.  Alm DOROTHA Sar, M.D. 09/15/2023 3:45 PM

## 2023-09-15 NOTE — Progress Notes (Signed)
 Patients presents for annual exam today. She states having trouble with her knees but doing well otherwise. Due for mammogram, ordered. Annual labs through PCP. She states no other questions or concerns at this time.

## 2023-09-27 ENCOUNTER — Other Ambulatory Visit: Payer: Self-pay | Admitting: Obstetrics and Gynecology

## 2023-09-27 DIAGNOSIS — Z1231 Encounter for screening mammogram for malignant neoplasm of breast: Secondary | ICD-10-CM

## 2023-09-29 ENCOUNTER — Ambulatory Visit
Admission: RE | Admit: 2023-09-29 | Discharge: 2023-09-29 | Disposition: A | Discharge status: Home / Self Care | Source: Ambulatory Visit | Attending: Family Medicine | Admitting: Family Medicine

## 2023-09-29 ENCOUNTER — Telehealth: Payer: Self-pay | Admitting: Internal Medicine

## 2023-09-29 VITALS — BP 133/72 | HR 85 | Temp 98.2°F | Resp 19

## 2023-09-29 DIAGNOSIS — J4540 Moderate persistent asthma, uncomplicated: Secondary | ICD-10-CM | POA: Diagnosis not present

## 2023-09-29 DIAGNOSIS — J069 Acute upper respiratory infection, unspecified: Secondary | ICD-10-CM | POA: Diagnosis not present

## 2023-09-29 LAB — SARS CORONAVIRUS 2 BY RT PCR: SARS Coronavirus 2 by RT PCR: NEGATIVE

## 2023-09-29 LAB — GROUP A STREP BY PCR: Group A Strep by PCR: NOT DETECTED

## 2023-09-29 MED ORDER — PREDNISONE 50 MG PO TABS: 50.0000 mg | ORAL_TABLET | Freq: Every day | ORAL | 0 refills | Status: AC

## 2023-09-29 MED ORDER — PROMETHAZINE-DM 6.25-15 MG/5ML PO SYRP: 5.0000 mL | ORAL_SOLUTION | Freq: Four times a day (QID) | ORAL | 0 refills | Status: DC | PRN

## 2023-09-29 NOTE — ED Provider Notes (Signed)
 [facility]    CSN: [id number] Arrival date & time: 09/29/23  1238      History   Chief Complaint Chief Complaint  Patient presents with   Cough    Coughing up phlegm, chest pain and some sore throat - Entered by patient   Sore Throat    HPI Danielle Delgado is a 73 y.o. female.   HPI  History obtained from the patient. Luvada presents for 3 days of productive cough, chest pain with breathing, ear pain, sore throat. No fever, vomiting, diarrhea.  Tried some cough medication last night.   She was coughing so much last night that it woke up her husband. She used her Flovent  inhaler but has not used her rescue inhaler. She called her asthma doctor but was not able to get seen until next week.      Past Medical History:  Diagnosis Date   Allergy    Asthma    Chronic vaginitis    Diabetes mellitus without complication (HCC)    Dyspareunia in female    Eczema    FH: breast cancer    FH: colon cancer    GERD (gastroesophageal reflux disease)    Hiatus hernia syndrome    Hyperlipidemia    Hypertension    Hypothyroidism    Morbid obesity (HCC)    Postmenopausal    Sleep apnea    Superficial varicosities    Vaginal atrophy     Patient Active Problem List   Diagnosis Date Noted   Chronic asthma, mild intermittent, uncomplicated 02/15/2023   OSA on CPAP 02/15/2023   Obesity, morbid (HCC) 02/15/2023   CPAP use counseling 02/15/2023   Leucocytosis 12/30/2021   Varicose veins with pain 11/02/2019   Intractable chronic migraine without aura and without status migrainosus 04/26/2019   Primary osteoarthritis of left knee 12/05/2018   Leg pain 10/19/2017   Nocturnal leg cramps 10/19/2017   Chronic venous insufficiency 03/02/2017   Lymphedema 03/02/2017   Increased BMI 07/10/2015   Pelvic pain in female 07/10/2015   Vaginal atrophy 07/10/2015   Status post vaginal hysterectomy 07/10/2015    Past Surgical History:  Procedure Laterality Date   BIOPSY   09/14/2022   Procedure: BIOPSY;  Surgeon: Maryruth Ole DASEN, MD;  Location: [facility] ENDOSCOPY;  Service: Endoscopy;;   BREAST EXCISIONAL BIOPSY Right 1996   neg   BREAST SURGERY     biopsy   CHOLECYSTECTOMY     COLONOSCOPY WITH PROPOFOL  N/A 10/12/2014   Procedure: COLONOSCOPY WITH PROPOFOL ;  Surgeon: Lamar DASEN Holmes, MD;  Location: [facility] ENDOSCOPY;  Service: Endoscopy;  Laterality: N/A;   COLONOSCOPY WITH PROPOFOL  N/A 09/14/2022   Procedure: COLONOSCOPY WITH PROPOFOL ;  Surgeon: Maryruth Ole DASEN, MD;  Location: [facility] ENDOSCOPY;  Service: Endoscopy;  Laterality: N/A;   DILATION AND CURETTAGE OF UTERUS     ESOPHAGOGASTRODUODENOSCOPY N/A 09/14/2022   Procedure: ESOPHAGOGASTRODUODENOSCOPY (EGD);  Surgeon: Maryruth Ole DASEN, MD;  Location: [facility] ENDOSCOPY;  Service: Endoscopy;  Laterality: N/A;   ESOPHAGOGASTRODUODENOSCOPY (EGD) WITH PROPOFOL  N/A 10/12/2014   Procedure: ESOPHAGOGASTRODUODENOSCOPY (EGD) WITH PROPOFOL ;  Surgeon: Lamar DASEN Holmes, MD;  Location: [facility] ENDOSCOPY;  Service: Endoscopy;  Laterality: N/A;   NASAL SINUS SURGERY     VAGINAL HYSTERECTOMY  1982    OB History     Gravida  1   Para  1   Term      Preterm  1   AB      Living  1  SAB      IAB      Ectopic      Multiple      Live Births  1            Home Medications    Prior to Admission medications   Medication Sig Start Date End Date Taking? Authorizing Provider  ACCU-CHEK AVIVA PLUS test strip  10/21/13  Yes [provider]  acetaminophen (TYLENOL) 650 MG CR tablet Take 650 mg by mouth every 8 (eight) hours as needed for pain.   Yes [provider]  albuterol  (VENTOLIN  HFA) 108 (90 Base) MCG/ACT inhaler Inhale 2 puffs into the lungs every 6 (six) hours as needed for wheezing or shortness of breath. 02/15/23  Yes Abernathy, Mardy, NP  Alcohol Swabs (DROPSAFE ALCOHOL PREP) 70 % PADS  03/17/23  Yes [provider]  amLODipine (NORVASC) 5 MG tablet Take 5 mg by mouth  daily. 12/24/21  Yes [provider]  aspirin EC 81 MG tablet Take 81 mg by mouth daily.   Yes [provider]  atorvastatin (LIPITOR) 10 MG tablet  01/11/14  Yes [provider]  Biotin 5000 MCG TABS Take 1 tablet by mouth daily.   Yes [provider]  calcium carbonate (OS-CAL) 600 MG TABS tablet Take 600 mg by mouth 2 (two) times daily with a meal.   Yes [provider]  cetirizine (ZYRTEC) 10 MG tablet Take 10 mg by mouth daily.   Yes [provider]  dapagliflozin propanediol (FARXIGA) 10 MG TABS tablet Take by mouth daily.   Yes [provider]  ferrous sulfate 325 (65 FE) MG tablet Take 325 mg by mouth daily with breakfast.   Yes [provider]  fluticasone  (FLOVENT  HFA) 110 MCG/ACT inhaler Inhale 2 puffs into the lungs 2 (two) times daily. 01/27/22  Yes Khan, Saadat A, MD  glipiZIDE (GLUCOTROL XL) 5 MG 24 hr tablet Take 5 mg by mouth daily with breakfast.   Yes [provider]  levothyroxine (SYNTHROID) 125 MCG tablet Take 125 mcg by mouth daily. 07/28/23  Yes [provider]  losartan-hydrochlorothiazide (HYZAAR) 50-12.5 MG per tablet  01/11/14  Yes [provider]  metFORMIN (GLUCOPHAGE) 1000 MG tablet Take by mouth.   Yes [provider]  mometasone  (ASMANEX ) 220 MCG/ACT inhaler Inhale 1 puff into the lungs.   Yes [provider]  montelukast  (SINGULAIR ) 10 MG tablet TAKE 1 TABLET EVERY DAY 09/21/22  Yes Fernand Elfreda LABOR, MD  Omega-3 Fatty Acids (FISH OIL CONCENTRATE) 300 MG CAPS Take by mouth.   Yes [provider]  pantoprazole  (PROTONIX ) 40 MG tablet TAKE 1 TABLET EVERY DAY 09/21/22  Yes Fernand Elfreda LABOR, MD  predniSONE  (DELTASONE ) 50 MG tablet Take 1 tablet (50 mg total) by mouth daily for 5 days. 09/29/23 10/04/23 Yes Meghanne Pletz, DO  promethazine -dextromethorphan (PROMETHAZINE -DM) 6.25-15 MG/5ML syrup Take 5 mLs by mouth 4 (four) times daily as needed. 09/29/23   Yes Jaquelinne Glendening, DO  Turmeric Curcumin 500 MG CAPS Take by mouth.   Yes [provider]    Family History Family History  Problem Relation Age of Onset   Osteoporosis Mother    Breast cancer Maternal Aunt 26   Diabetes Maternal Grandmother    Diabetes Maternal Grandfather    Heart disease Maternal Grandfather    Colon cancer Cousin    Breast cancer Cousin 40       mat cousin   Cancer Father    Ovarian  cancer Neg Hx     Social History Social History   Tobacco Use   Smoking status: Never   Smokeless tobacco: Never  Vaping Use   Vaping status: Never Used  Substance Use Topics   Alcohol use: No    Alcohol/week: 0.0 standard drinks of alcohol   Drug use: No     Allergies   Cisapride, Sulfa antibiotics, and Biaxin [clarithromycin]   Review of Systems Review of Systems: negative unless otherwise stated in HPI.      Physical Exam Triage Vital Signs ED Triage Vitals  Encounter Vitals Group     BP 09/29/23 1258 133/72     Girls Systolic BP Percentile --      Girls Diastolic BP Percentile --      Boys Systolic BP Percentile --      Boys Diastolic BP Percentile --      Pulse Rate 09/29/23 1258 85     Resp 09/29/23 1258 19     Temp 09/29/23 1258 98.2 F (36.8 C)     Temp Source 09/29/23 1258 Oral     SpO2 09/29/23 1258 96 %     Weight --      Height --      Head Circumference --      Peak Flow --      Pain Score 09/29/23 1257 5     Pain Loc --      Pain Education --      Exclude from Growth Chart --    No data found.  Updated Vital Signs BP 133/72 (BP Location: Left Arm)   Pulse 85   Temp 98.2 F (36.8 C) (Oral)   Resp 19   SpO2 96%   Visual Acuity Right Eye Distance:   Left Eye Distance:   Bilateral Distance:    Right Eye Near:   Left Eye Near:    Bilateral Near:     Physical Exam GEN:     alert, non-toxic appearing female in no distress    HENT:  mucus membranes moist, oropharyngeal without lesions, moderate erythema, no  tonsillar hypertrophy or exudates, clear nasal discharge EYES:   no scleral injection or discharge NECK:  normal ROM, no meningismus   RESP:  no increased work of breathing, clear to auscultation bilaterally CVS:   regular rate and rhythm Skin:   warm and dry    UC Treatments / Results  Labs (all labs ordered are listed, but only abnormal results are displayed) Labs Reviewed  SARS CORONAVIRUS 2 BY RT PCR  GROUP A STREP BY PCR    EKG   Radiology No results found.  Procedures Procedures (including critical care time)  Medications Ordered in UC Medications - No data to display  Initial Impression / Assessment and Plan / UC Course  I have reviewed the triage vital signs and the nursing notes.  Pertinent labs & imaging results that were available during my care of the patient were reviewed by me and considered in my medical decision making (see chart for details).       Pt is a 73 y.o. female who presents for 3 days of respiratory symptoms. Emberli is afebrile here. Satting well on room air. Overall pt is non-toxic appearing, well hydrated, without respiratory distress. Pulmonary exam is unremarkable.  She does have oropharyngeal erythema.  COVID and strep PCR tests obtained and were negative.  History consistent with viral respiratory illness. Discussed symptomatic treatment.  Explained lack of efficacy of antibiotics in viral  disease.  Typical duration of symptoms discussed.  Given her history of asthma and needing inhalers more frequently than she did previously we will give her a steroid burst.  Promethazine  DM for cough.  Return and ED precautions given and voiced understanding. Discussed MDM, treatment plan and plan for follow-up with patient who agrees with plan.     Final Clinical Impressions(s) / UC Diagnoses   Final diagnoses:  Viral URI with cough  Moderate persistent asthma, unspecified whether complicated     Discharge Instructions      Judith B Leisure's  strep and COVID are negative. You have a viral respiratory infection that will gradually improve over the next 7-10 days. Cough may last up to 3 weeks.    You can take Tylenol and/or Ibuprofen as needed for fever reduction and pain relief.    For cough:  Stop at the pharmacy to pick up your prescription cough medication.  You can use a humidifier for chest congestion and cough.  If you don't have a humidifier, you can sit in the bathroom with the hot shower running.      For sore throat: try warm salt water gargles, Mucinex sore throat cough drops or cepacol lozenges, throat spray, warm tea or water with lemon/honey, popsicles or ice, or OTC cold relief medicine for throat discomfort. You can also purchase chloraseptic spray at the pharmacy or dollar store.   For congestion: take a daily anti-histamine like Zyrtec, Claritin, and a oral decongestant, such as pseudoephedrine.  You can also use Flonase  1-2 sprays in each nostril daily. Afrin is also a good option, if you do not have high blood pressure.    It is important to stay hydrated: drink plenty of fluids (water, gatorade/powerade/pedialyte, juices, or teas) to keep your throat moisturized and help further relieve irritation/discomfort.    Return or go to the Emergency Department if symptoms worsen or do not improve in the next few days      ED Prescriptions     Medication Sig Dispense Auth. Provider   predniSONE  (DELTASONE ) 50 MG tablet Take 1 tablet (50 mg total) by mouth daily for 5 days. 5 tablet Undray Allman, DO   promethazine -dextromethorphan (PROMETHAZINE -DM) 6.25-15 MG/5ML syrup Take 5 mLs by mouth 4 (four) times daily as needed. 118 mL Raynard Mapps, DO      PDMP not reviewed this encounter.   Kriste Berth, DO 09/29/23 1420

## 2023-09-29 NOTE — Telephone Encounter (Signed)
 Patient called requesting sick visit w/ [facility] for cough and wheezing. Next available appt 10/04/23. Patient did not want to wait that long. Will go to either urgent care or [facility]

## 2023-09-29 NOTE — ED Triage Notes (Signed)
 Patient states that she started coughing Sunday night, patient has a hx of asthma, patient states that her chest hurts when she coughs. Patient states that she also has bilateral ear pain and a sore throat. Patient states that she hasn't had any fever. Patient states that she used her inhaler and took some cough syrup last night.

## 2023-09-29 NOTE — Discharge Instructions (Addendum)
 Danielle Delgado's strep and COVID are negative. You have a viral respiratory infection that will gradually improve over the next 7-10 days. Cough may last up to 3 weeks.    You can take Tylenol and/or Ibuprofen as needed for fever reduction and pain relief.    For cough:  Stop at the pharmacy to pick up your prescription cough medication.  You can use a humidifier for chest congestion and cough.  If you don't have a humidifier, you can sit in the bathroom with the hot shower running.      For sore throat: try warm salt water gargles, Mucinex sore throat cough drops or cepacol lozenges, throat spray, warm tea or water with lemon/honey, popsicles or ice, or OTC cold relief medicine for throat discomfort. You can also purchase chloraseptic spray at the pharmacy or dollar store.   For congestion: take a daily anti-histamine like Zyrtec, Claritin, and a oral decongestant, such as pseudoephedrine.  You can also use Flonase  1-2 sprays in each nostril daily. Afrin is also a good option, if you do not have high blood pressure.    It is important to stay hydrated: drink plenty of fluids (water, gatorade/powerade/pedialyte, juices, or teas) to keep your throat moisturized and help further relieve irritation/discomfort.    Return or go to the Emergency Department if symptoms worsen or do not improve in the next few days

## 2023-10-22 ENCOUNTER — Other Ambulatory Visit: Payer: Self-pay

## 2023-10-22 DIAGNOSIS — I152 Hypertension secondary to endocrine disorders: Secondary | ICD-10-CM

## 2023-10-22 DIAGNOSIS — E1169 Type 2 diabetes mellitus with other specified complication: Secondary | ICD-10-CM

## 2023-10-22 DIAGNOSIS — E038 Other specified hypothyroidism: Secondary | ICD-10-CM

## 2023-10-22 DIAGNOSIS — E1165 Type 2 diabetes mellitus with hyperglycemia: Secondary | ICD-10-CM

## 2023-10-22 MED ORDER — AMLODIPINE BESYLATE 5 MG PO TABS
5.0000 mg | ORAL_TABLET | Freq: Every day | ORAL | 0 refills | Status: DC
Start: 2023-10-22 — End: 2023-11-02

## 2023-10-22 MED ORDER — METFORMIN HCL 1000 MG PO TABS
1000.0000 mg | ORAL_TABLET | Freq: Two times a day (BID) | ORAL | 0 refills | Status: DC
Start: 2023-10-22 — End: 2023-11-02

## 2023-10-22 MED ORDER — LOSARTAN POTASSIUM-HCTZ 100-25 MG PO TABS: 1.0000 | ORAL_TABLET | Freq: Every day | ORAL | 0 refills | Status: DC

## 2023-10-22 MED ORDER — LEVOTHYROXINE SODIUM 125 MCG PO TABS
125.0000 ug | ORAL_TABLET | Freq: Every day | ORAL | 0 refills | Status: DC
Start: 2023-10-22 — End: 2023-11-02

## 2023-10-22 MED ORDER — ATORVASTATIN CALCIUM 10 MG PO TABS
10.0000 mg | ORAL_TABLET | Freq: Every day | ORAL | 0 refills | Status: DC
Start: 2023-10-22 — End: 2023-11-16

## 2023-10-22 MED ORDER — GLIPIZIDE ER 5 MG PO TB24
5.0000 mg | ORAL_TABLET | Freq: Every day | ORAL | 0 refills | Status: DC
Start: 2023-10-22 — End: 2023-11-02

## 2023-10-28 ENCOUNTER — Ambulatory Visit: Admitting: Internal Medicine

## 2023-10-28 ENCOUNTER — Encounter: Payer: Self-pay | Admitting: Internal Medicine

## 2023-11-02 ENCOUNTER — Ambulatory Visit
Admission: RE | Admit: 2023-11-02 | Discharge: 2023-11-02 | Disposition: A | Discharge status: Home / Self Care | Source: Ambulatory Visit | Attending: Obstetrics and Gynecology | Admitting: Obstetrics and Gynecology

## 2023-11-02 ENCOUNTER — Ambulatory Visit: Payer: Self-pay | Admitting: Internal Medicine

## 2023-11-02 ENCOUNTER — Ambulatory Visit (INDEPENDENT_AMBULATORY_CARE_PROVIDER_SITE_OTHER): Admitting: Internal Medicine

## 2023-11-02 ENCOUNTER — Encounter: Payer: Self-pay | Admitting: Internal Medicine

## 2023-11-02 VITALS — BP 126/82 | HR 89 | Ht 63.0 in | Wt 231.2 lb

## 2023-11-02 DIAGNOSIS — I152 Hypertension secondary to endocrine disorders: Secondary | ICD-10-CM

## 2023-11-02 DIAGNOSIS — G4733 Obstructive sleep apnea (adult) (pediatric): Secondary | ICD-10-CM

## 2023-11-02 DIAGNOSIS — M25562 Pain in left knee: Secondary | ICD-10-CM

## 2023-11-02 DIAGNOSIS — K219 Gastro-esophageal reflux disease without esophagitis: Secondary | ICD-10-CM | POA: Diagnosis not present

## 2023-11-02 DIAGNOSIS — E1159 Type 2 diabetes mellitus with other circulatory complications: Secondary | ICD-10-CM | POA: Diagnosis not present

## 2023-11-02 DIAGNOSIS — E1169 Type 2 diabetes mellitus with other specified complication: Secondary | ICD-10-CM | POA: Insufficient documentation

## 2023-11-02 DIAGNOSIS — Z1231 Encounter for screening mammogram for malignant neoplasm of breast: Secondary | ICD-10-CM | POA: Insufficient documentation

## 2023-11-02 DIAGNOSIS — E038 Other specified hypothyroidism: Secondary | ICD-10-CM | POA: Diagnosis not present

## 2023-11-02 DIAGNOSIS — Z13820 Encounter for screening for osteoporosis: Secondary | ICD-10-CM | POA: Insufficient documentation

## 2023-11-02 DIAGNOSIS — E1165 Type 2 diabetes mellitus with hyperglycemia: Secondary | ICD-10-CM | POA: Insufficient documentation

## 2023-11-02 DIAGNOSIS — E782 Mixed hyperlipidemia: Secondary | ICD-10-CM

## 2023-11-02 DIAGNOSIS — M25561 Pain in right knee: Secondary | ICD-10-CM | POA: Diagnosis not present

## 2023-11-02 DIAGNOSIS — G8929 Other chronic pain: Secondary | ICD-10-CM | POA: Insufficient documentation

## 2023-11-02 LAB — POC CREATINE & ALBUMIN,URINE
Creatinine, POC: 50 mg/dL
Microalbumin Ur, POC: 10 mg/L

## 2023-11-02 LAB — POCT CBG (FASTING - GLUCOSE)-MANUAL ENTRY: Glucose Fasting, POC: 343 mg/dL — AB (ref 70–99)

## 2023-11-02 MED ORDER — LOSARTAN POTASSIUM-HCTZ 100-25 MG PO TABS: 1.0000 | ORAL_TABLET | Freq: Every day | ORAL | 3 refills | Status: AC

## 2023-11-02 MED ORDER — METFORMIN HCL 1000 MG PO TABS: 1000.0000 mg | ORAL_TABLET | Freq: Two times a day (BID) | ORAL | 3 refills | Status: AC

## 2023-11-02 MED ORDER — AMLODIPINE BESYLATE 5 MG PO TABS
5.0000 mg | ORAL_TABLET | Freq: Every day | ORAL | 3 refills | Status: AC
Start: 2023-11-02 — End: ?

## 2023-11-02 MED ORDER — PANTOPRAZOLE SODIUM 40 MG PO TBEC: 40.0000 mg | DELAYED_RELEASE_TABLET | Freq: Every day | ORAL | 3 refills | Status: AC

## 2023-11-02 MED ORDER — GLIPIZIDE ER 5 MG PO TB24: 5.0000 mg | ORAL_TABLET | Freq: Every day | ORAL | 3 refills | Status: AC

## 2023-11-02 MED ORDER — LEVOTHYROXINE SODIUM 125 MCG PO TABS: 125.0000 ug | ORAL_TABLET | Freq: Every day | ORAL | 3 refills | Status: AC

## 2023-11-02 MED ORDER — ASPIRIN EC 81 MG PO TBEC: 81.0000 mg | DELAYED_RELEASE_TABLET | Freq: Every day | ORAL | 3 refills | Status: AC

## 2023-11-02 NOTE — Progress Notes (Signed)
 New Patient Office Visit  Subjective   Patient ID: Danielle Delgado, female    DOB: 12-11-1950  Age: 73 y.o. MRN: [id number]  CC:  Chief Complaint  Patient presents with   Establish Care    NPE    HPI Danielle Delgado presents to establish care Previous Primary Care provider/office:   she does not have additional concerns to discuss today.   Patient is a new patient here to establish care with us . Patient endorses taking her medications as prescribed and asked for refills. Medication refills sent to mail order pharmacy at patients request. She reports she is feeling well but has chronic bilateral knee osteoarthritis. She sees orthopedics and they recently tried gel joint injections without much relief. She reports they want her to do knee replacement surgery but she is hesitant to be put to sleep. She does endorse some stationary pedal bike usage daily for exercise but recently increased her frequency and ended up with exercise induced capillaritis and a friction skin irritation and blister behind left knee.  She has a history of OSA and wears her CPAP nightly. She just had DM eye exam completed last week and reports it was normal. Her last colonoscopy was in 2024, and her mammogram is scheduled for later today. Patient is unsure of her last DEXA scan, she believes it has been more than two years; will order DEXA scan. Patient is not fasting today, will return for fasting labs later this week.  Patient POC glucose today in office was 343. Her last HbgA1c was 6.9% in 07/2023. Patient reports she ate frosted flakes cereal for lunch. Discussed diet modifications and certain foods to avoid that are high in sugar and carbohydrates. Patient reports she is aware of the foods she should be eating and at this time does not think a referral to a dietician would be beneficial. Will readdress diet at future appointments and monitor HbgA1c. Patient takes Metformin  1000 mg twice daily and reports she sometimes  had loose stools. Denies abdominal pain or crams, blood in stool, or melena. Loose bowels could be diet related. Encouraged patient to keep food journal on the days she experiences frequent loose stools to see if there is a dietary pattern.    Outpatient Encounter Medications as of 11/02/2023  Medication Sig   ACCU-CHEK AVIVA PLUS test strip    acetaminophen (TYLENOL) 650 MG CR tablet Take 650 mg by mouth every 8 (eight) hours as needed for pain.   albuterol  (VENTOLIN  HFA) 108 (90 Base) MCG/ACT inhaler Inhale 2 puffs into the lungs every 6 (six) hours as needed for wheezing or shortness of breath.   Alcohol Swabs (DROPSAFE ALCOHOL PREP) 70 % PADS    atorvastatin  (LIPITOR) 10 MG tablet Take 1 tablet (10 mg total) by mouth daily.   Biotin 5000 MCG TABS Take 1 tablet by mouth daily.   cetirizine (ZYRTEC) 10 MG tablet Take 10 mg by mouth daily.   dapagliflozin propanediol (FARXIGA) 10 MG TABS tablet Take by mouth daily.   ferrous sulfate 325 (65 FE) MG tablet Take 325 mg by mouth daily with breakfast.   fluticasone  (FLOVENT  HFA) 110 MCG/ACT inhaler Inhale 2 puffs into the lungs 2 (two) times daily.   mometasone  (ASMANEX ) 220 MCG/ACT inhaler Inhale 1 puff into the lungs.   montelukast  (SINGULAIR ) 10 MG tablet TAKE 1 TABLET EVERY DAY   Omega-3 Fatty Acids (FISH OIL CONCENTRATE) 300 MG CAPS Take by mouth.   Turmeric Curcumin 500 MG CAPS Take by  mouth.   [DISCONTINUED] amLODipine  (NORVASC ) 5 MG tablet Take 1 tablet (5 mg total) by mouth daily.   [DISCONTINUED] aspirin  EC 81 MG tablet Take 81 mg by mouth daily.   [DISCONTINUED] glipiZIDE  (GLUCOTROL  XL) 5 MG 24 hr tablet Take 1 tablet (5 mg total) by mouth daily with breakfast.   [DISCONTINUED] levothyroxine  (SYNTHROID ) 125 MCG tablet Take 1 tablet (125 mcg total) by mouth daily.   [DISCONTINUED] losartan -hydrochlorothiazide (HYZAAR) 100-25 MG tablet Take 1 tablet by mouth daily.   [DISCONTINUED] metFORMIN  (GLUCOPHAGE ) 1000 MG tablet Take 1 tablet  (1,000 mg total) by mouth 2 (two) times daily with a meal.   [DISCONTINUED] pantoprazole  (PROTONIX ) 40 MG tablet TAKE 1 TABLET EVERY DAY   amLODipine  (NORVASC ) 5 MG tablet Take 1 tablet (5 mg total) by mouth daily.   aspirin  EC 81 MG tablet Take 1 tablet (81 mg total) by mouth daily.   calcium  carbonate (OS-CAL) 600 MG TABS tablet Take 600 mg by mouth 2 (two) times daily with a meal.   glipiZIDE  (GLUCOTROL  XL) 5 MG 24 hr tablet Take 1 tablet (5 mg total) by mouth daily with breakfast.   levothyroxine  (SYNTHROID ) 125 MCG tablet Take 1 tablet (125 mcg total) by mouth daily.   losartan -hydrochlorothiazide (HYZAAR) 100-25 MG tablet Take 1 tablet by mouth daily.   metFORMIN  (GLUCOPHAGE ) 1000 MG tablet Take 1 tablet (1,000 mg total) by mouth 2 (two) times daily with a meal.   pantoprazole  (PROTONIX ) 40 MG tablet Take 1 tablet (40 mg total) by mouth daily.   promethazine -dextromethorphan (PROMETHAZINE -DM) 6.25-15 MG/5ML syrup Take 5 mLs by mouth 4 (four) times daily as needed.   No facility-administered encounter medications on file as of 11/02/2023.    Past Medical History:  Diagnosis Date   Allergy    Asthma    Chronic vaginitis    Diabetes mellitus without complication (HCC)    Dyspareunia in female    Eczema    FH: breast cancer    FH: colon cancer    GERD (gastroesophageal reflux disease)    Hiatus hernia syndrome    Hyperlipidemia    Hypertension    Hypothyroidism    Morbid obesity (HCC)    Postmenopausal    Sleep apnea    Superficial varicosities    Vaginal atrophy     Past Surgical History:  Procedure Laterality Date   BIOPSY  09/14/2022   Procedure: BIOPSY;  Surgeon: Maryruth Ole DASEN, MD;  Location: [facility] ENDOSCOPY;  Service: Endoscopy;;   BREAST EXCISIONAL BIOPSY Right 1996   neg   BREAST SURGERY     biopsy   CHOLECYSTECTOMY     COLONOSCOPY WITH PROPOFOL  N/A 10/12/2014   Procedure: COLONOSCOPY WITH PROPOFOL ;  Surgeon: Lamar DASEN Holmes, MD;  Location: [facility] ENDOSCOPY;   Service: Endoscopy;  Laterality: N/A;   COLONOSCOPY WITH PROPOFOL  N/A 09/14/2022   Procedure: COLONOSCOPY WITH PROPOFOL ;  Surgeon: Maryruth Ole DASEN, MD;  Location: [facility] ENDOSCOPY;  Service: Endoscopy;  Laterality: N/A;   DILATION AND CURETTAGE OF UTERUS     ESOPHAGOGASTRODUODENOSCOPY N/A 09/14/2022   Procedure: ESOPHAGOGASTRODUODENOSCOPY (EGD);  Surgeon: Maryruth Ole DASEN, MD;  Location: [facility] ENDOSCOPY;  Service: Endoscopy;  Laterality: N/A;   ESOPHAGOGASTRODUODENOSCOPY (EGD) WITH PROPOFOL  N/A 10/12/2014   Procedure: ESOPHAGOGASTRODUODENOSCOPY (EGD) WITH PROPOFOL ;  Surgeon: Lamar DASEN Holmes, MD;  Location: [facility] ENDOSCOPY;  Service: Endoscopy;  Laterality: N/A;   NASAL SINUS SURGERY     VAGINAL HYSTERECTOMY  1982    Family History  Problem Relation Age of Onset  Osteoporosis Mother    Breast cancer Maternal Aunt 80   Diabetes Maternal Grandmother    Diabetes Maternal Grandfather    Heart disease Maternal Grandfather    Colon cancer Cousin    Breast cancer Cousin 40       mat cousin   Cancer Father    Ovarian cancer Neg Hx     Social History   Socioeconomic History   Marital status: Married    Spouse name: Not on file   Number of children: Not on file   Years of education: Not on file   Highest education level: Not on file  Occupational History   Not on file  Tobacco Use   Smoking status: Never   Smokeless tobacco: Never  Vaping Use   Vaping status: Never Used  Substance and Sexual Activity   Alcohol use: No    Alcohol/week: 0.0 standard drinks of alcohol   Drug use: No   Sexual activity: Not Currently    Birth control/protection: Surgical    Comment: hysterectomy  Other Topics Concern   Not on file  Social History Narrative   Not on file   Social Drivers of Health   Financial Resource Strain: Low Risk  (08/23/2023)   Received from [facility]   Overall Financial Resource Strain (CARDIA)    Difficulty of Paying Living Expenses: Not hard at  all  Food Insecurity: No Food Insecurity (08/23/2023)   Received from [facility]   Hunger Vital Sign    Within the past 12 months, you worried that your food would run out before you got the money to buy more.: Never true    Within the past 12 months, the food you bought just didn't last and you didn't have money to get more.: Never true  Transportation Needs: No Transportation Needs (08/23/2023)   Received from [facility] - Transportation    In the past 12 months, has lack of transportation kept you from medical appointments or from getting medications?: No    Lack of Transportation (Non-Medical): No  Physical Activity: Insufficiently Active (07/14/2017)   Exercise Vital Sign    Days of Exercise per Week: 2 days    Minutes of Exercise per Session: 30 min  Stress: Not on file  Social Connections: Not on file  Intimate Partner Violence: Not on file    Review of Systems  Constitutional: Negative.  Negative for chills, diaphoresis, fever, malaise/fatigue and weight loss.  HENT: Negative.    Eyes: Negative.  Negative for blurred vision and double vision.  Respiratory: Negative.  Negative for cough and shortness of breath.   Cardiovascular: Negative.  Negative for chest pain, palpitations and leg swelling.  Gastrointestinal:  Negative for abdominal pain, blood in stool, constipation, diarrhea, heartburn, melena, nausea and vomiting.       Frequent loose stools  Genitourinary: Negative.  Negative for dysuria and flank pain.  Musculoskeletal:  Positive for joint pain (bilateral knees). Negative for myalgias.  Skin:  Positive for rash (behind left knee, blister).  Neurological: Negative.  Negative for dizziness and headaches.  Endo/Heme/Allergies: Negative.   Psychiatric/Behavioral: Negative.  Negative for depression and suicidal ideas. The patient is not nervous/anxious.         Objective   BP 126/82   Pulse 89   Ht 5' 3 (1.6 m)   Wt  231 lb 3.2 oz (104.9 kg)   SpO2 98%   BMI 40.96 kg/m   Physical  Exam Vitals and nursing note reviewed.  Constitutional:      Appearance: Normal appearance.  HENT:     Head: Normocephalic and atraumatic.     Nose: Nose normal.     Mouth/Throat:     Mouth: Mucous membranes are moist.     Pharynx: Oropharynx is clear.  Eyes:     Conjunctiva/sclera: Conjunctivae normal.     Pupils: Pupils are equal, round, and reactive to [place].  Cardiovascular:     Rate and Rhythm: Normal rate and regular rhythm.     Pulses: Normal pulses.     Heart sounds: Normal heart sounds. No murmur heard. Pulmonary:     Effort: Pulmonary effort is normal.     Breath sounds: Normal breath sounds. No wheezing.  Abdominal:     General: Bowel sounds are normal.     Palpations: Abdomen is soft.     Tenderness: There is no abdominal tenderness. There is no right CVA tenderness or left CVA tenderness.  Musculoskeletal:        General: Normal range of motion.     Cervical back: Normal range of motion.     Right lower leg: No edema.     Left lower leg: No edema.  Skin:    General: Skin is warm and dry.  Neurological:     General: No focal deficit present.     Mental Status: She is alert and oriented to person, place, and time.  Psychiatric:        Mood and Affect: Mood normal.        Behavior: Behavior normal.        Assessment & Plan:  Refilled and continue current medications as prescribed. Will collect routine blood work when fasting. Will discuss results with patient. Order DEXA scan for screening for osteoporosis. Encouraged strict low carb, no concentrated sweets diet, drink plenty of water a day and exercise as tolerated. Problem List Items Addressed This Visit     OSA on CPAP   Obesity, morbid (HCC)   Relevant Medications   metFORMIN  (GLUCOPHAGE ) 1000 MG tablet   glipiZIDE  (GLUCOTROL  XL) 5 MG 24 hr tablet   Type 2 diabetes mellitus with hyperglycemia, without long-term current use of insulin  (HCC) - Primary   Relevant Medications   metFORMIN  (GLUCOPHAGE ) 1000 MG tablet   losartan -hydrochlorothiazide (HYZAAR) 100-25 MG tablet   glipiZIDE  (GLUCOTROL  XL) 5 MG 24 hr tablet   aspirin  EC 81 MG tablet   Other Relevant Orders   POCT CBG (Fasting - Glucose) (Completed)   Hemoglobin A1c   POC CREATINE & ALBUMIN,URINE (Completed)   Combined hyperlipidemia associated with type 2 diabetes mellitus (HCC)   Relevant Medications   metFORMIN  (GLUCOPHAGE ) 1000 MG tablet   losartan -hydrochlorothiazide (HYZAAR) 100-25 MG tablet   glipiZIDE  (GLUCOTROL  XL) 5 MG 24 hr tablet   aspirin  EC 81 MG tablet   amLODipine  (NORVASC ) 5 MG tablet   Other Relevant Orders   Lipid Panel w/o Chol/HDL Ratio   Hypertension associated with diabetes (HCC)   Relevant Medications   metFORMIN  (GLUCOPHAGE ) 1000 MG tablet   losartan -hydrochlorothiazide (HYZAAR) 100-25 MG tablet   glipiZIDE  (GLUCOTROL  XL) 5 MG 24 hr tablet   aspirin  EC 81 MG tablet   amLODipine  (NORVASC ) 5 MG tablet   Other Relevant Orders   CBC with Diff   CMP14+EGFR   Other specified hypothyroidism   Relevant Medications   levothyroxine  (SYNTHROID ) 125 MCG tablet   Other Relevant Orders   TSH+T4F+T3Free   Chronic pain  of both knees   Relevant Medications   aspirin  EC 81 MG tablet   Screening for osteoporosis   Relevant Orders   HM DEXA SCAN (Completed)   Gastroesophageal reflux disease without esophagitis   Relevant Medications   pantoprazole  (PROTONIX ) 40 MG tablet    Follow up 3 months.   Total time spent: 30 minutes  FERNAND FREDY RAMAN, MD  11/02/2023   This document may have been prepared by [facility] Voice Recognition software and as such may include unintentional dictation errors.

## 2023-11-04 ENCOUNTER — Other Ambulatory Visit

## 2023-11-04 DIAGNOSIS — E038 Other specified hypothyroidism: Secondary | ICD-10-CM | POA: Diagnosis not present

## 2023-11-04 DIAGNOSIS — E1165 Type 2 diabetes mellitus with hyperglycemia: Secondary | ICD-10-CM | POA: Diagnosis not present

## 2023-11-04 DIAGNOSIS — I152 Hypertension secondary to endocrine disorders: Secondary | ICD-10-CM | POA: Diagnosis not present

## 2023-11-04 DIAGNOSIS — E782 Mixed hyperlipidemia: Secondary | ICD-10-CM | POA: Diagnosis not present

## 2023-11-04 DIAGNOSIS — E1159 Type 2 diabetes mellitus with other circulatory complications: Secondary | ICD-10-CM | POA: Diagnosis not present

## 2023-11-04 DIAGNOSIS — E1169 Type 2 diabetes mellitus with other specified complication: Secondary | ICD-10-CM | POA: Diagnosis not present

## 2023-11-05 LAB — CMP14+EGFR
ALT: 15 IU/L (ref 0–32)
AST: 15 IU/L (ref 0–40)
Albumin: 4.6 g/dL (ref 3.8–4.8)
Alkaline Phosphatase: 101 IU/L (ref 44–121)
BUN/Creatinine Ratio: 16 (ref 12–28)
BUN: 16 mg/dL (ref 8–27)
Bilirubin Total: 0.4 mg/dL (ref 0.0–1.2)
CO2: 24 mmol/L (ref 20–29)
Calcium: 9.8 mg/dL (ref 8.7–10.3)
Chloride: 100 mmol/L (ref 96–106)
Creatinine, Ser: 1 mg/dL (ref 0.57–1.00)
Globulin, Total: 2.3 g/dL (ref 1.5–4.5)
Glucose: 119 mg/dL — ABNORMAL HIGH (ref 70–99)
Potassium: 4.2 mmol/L (ref 3.5–5.2)
Sodium: 141 mmol/L (ref 134–144)
Total Protein: 6.9 g/dL (ref 6.0–8.5)
eGFR: 59 mL/min/1.73 — ABNORMAL LOW (ref >59)

## 2023-11-05 LAB — CBC WITH DIFFERENTIAL/PLATELET
Basophils Absolute: 0.1 x10E3/uL (ref 0.0–0.2)
Basos: 1 %
EOS (ABSOLUTE): 0.3 x10E3/uL (ref 0.0–0.4)
Eos: 2 %
Hematocrit: 41.5 % (ref 34.0–46.6)
Hemoglobin: 13.2 g/dL (ref 11.1–15.9)
Immature Grans (Abs): 0.1 x10E3/uL (ref 0.0–0.1)
Immature Granulocytes: 1 %
Lymphocytes Absolute: 3.3 x10E3/uL — ABNORMAL HIGH (ref 0.7–3.1)
Lymphs: 30 %
MCH: 31 pg (ref 26.6–33.0)
MCHC: 31.8 g/dL (ref 31.5–35.7)
MCV: 97 fL (ref 79–97)
Monocytes Absolute: 0.7 x10E3/uL (ref 0.1–0.9)
Monocytes: 7 %
Neutrophils Absolute: 6.3 x10E3/uL (ref 1.4–7.0)
Neutrophils: 59 %
Platelets: 360 x10E3/uL (ref 150–450)
RBC: 4.26 x10E6/uL (ref 3.77–5.28)
RDW: 12.7 % (ref 11.7–15.4)
WBC: 10.7 x10E3/uL (ref 3.4–10.8)

## 2023-11-05 LAB — TSH+T4F+T3FREE
Free T4: 1.78 ng/dL — ABNORMAL HIGH (ref 0.82–1.77)
T3, Free: 2.4 pg/mL (ref 2.0–4.4)
TSH: 2.56 u[IU]/mL (ref 0.450–4.500)

## 2023-11-05 LAB — LIPID PANEL W/O CHOL/HDL RATIO
Cholesterol, Total: 144 mg/dL (ref 100–199)
HDL: 50 mg/dL (ref >39)
LDL Chol Calc (NIH): 67 mg/dL (ref 0–99)
Triglycerides: 161 mg/dL — ABNORMAL HIGH (ref 0–149)
VLDL Cholesterol Cal: 27 mg/dL (ref 5–40)

## 2023-11-05 LAB — HEMOGLOBIN A1C
Est. average glucose Bld gHb Est-mCnc: 151 mg/dL
Hgb A1c MFr Bld: 6.9 % — ABNORMAL HIGH (ref 4.8–5.6)

## 2023-11-11 ENCOUNTER — Ambulatory Visit (INDEPENDENT_AMBULATORY_CARE_PROVIDER_SITE_OTHER): Admitting: Vascular Surgery

## 2023-11-11 VITALS — BP 143/84 | HR 84 | Ht 63.0 in | Wt 230.0 lb

## 2023-11-11 DIAGNOSIS — E1165 Type 2 diabetes mellitus with hyperglycemia: Secondary | ICD-10-CM | POA: Diagnosis not present

## 2023-11-11 DIAGNOSIS — E1159 Type 2 diabetes mellitus with other circulatory complications: Secondary | ICD-10-CM

## 2023-11-11 DIAGNOSIS — I872 Venous insufficiency (chronic) (peripheral): Secondary | ICD-10-CM

## 2023-11-11 DIAGNOSIS — I152 Hypertension secondary to endocrine disorders: Secondary | ICD-10-CM | POA: Diagnosis not present

## 2023-11-15 ENCOUNTER — Telehealth: Payer: Self-pay

## 2023-11-15 NOTE — Telephone Encounter (Signed)
 Patient LM stating she seen Dr Fredy on 9/2 and she filled all her medications with [facility] but 2 of them, she did not state which ones those 2 were, need to call the patient and find out

## 2023-11-16 ENCOUNTER — Other Ambulatory Visit: Payer: Self-pay | Admitting: Internal Medicine

## 2023-11-16 ENCOUNTER — Encounter (INDEPENDENT_AMBULATORY_CARE_PROVIDER_SITE_OTHER): Payer: Self-pay | Admitting: Vascular Surgery

## 2023-11-16 ENCOUNTER — Other Ambulatory Visit: Payer: Self-pay

## 2023-11-16 DIAGNOSIS — E1169 Type 2 diabetes mellitus with other specified complication: Secondary | ICD-10-CM

## 2023-11-16 MED ORDER — ATORVASTATIN CALCIUM 10 MG PO TABS: 10.0000 mg | ORAL_TABLET | Freq: Every day | ORAL | 3 refills | Status: AC

## 2023-11-16 NOTE — Telephone Encounter (Signed)
 Rx refilled.

## 2023-11-16 NOTE — Progress Notes (Signed)
 Subjective:    Patient ID: Danielle Delgado, female    DOB: [birth year], 73 y.o.   MRN: [id number] Chief Complaint  Patient presents with   New Patient (Initial Visit)     np. consult. venous stasis. (LS 2022 GS). benitez-graham, ana. Notice a rash on both legs  she had when to urgent care. She notice this after using her seating bike for about 30 minutes      Danielle Delgado is a 73 yo female who presents to clinic today with chief complaint of rash to bilateral lower extremities after riding her seated exercise bike.  She went to urgent care to be treated after noticing the rash. They gave her Hydrocortisone cream to rub on her legs. Seeing me today the rash and some associated swelling has resolved. She currently denies any pain to her legs either at rest or with ambulation. She endorses having some swelling to her lower legs if she is standing or walking for long periods of time. This resolves after sleeping well at night. No noted open sores or infections. She endorses she was instructed on her last visit with us  to wear compression socks but states she does rarely.     Review of Systems  Constitutional: Negative.   Cardiovascular:  Positive for leg swelling.       Currently resolved   Skin:  Positive for rash.       Currently resolved   All other systems reviewed and are negative.      Objective:   Physical Exam Vitals reviewed.  Constitutional:      Appearance: Normal appearance. She is obese.  HENT:     Head: Normocephalic.  Eyes:     Pupils: Pupils are equal, round, and reactive to Pal.  Cardiovascular:     Rate and Rhythm: Normal rate.     Pulses: Normal pulses.     Heart sounds: Normal heart sounds.  Pulmonary:     Effort: Pulmonary effort is normal.     Breath sounds: Normal breath sounds.  Abdominal:     General: Bowel sounds are normal.     Palpations: Abdomen is soft.  Musculoskeletal:        General: Normal range of motion.  Skin:    General: Skin is warm and  dry.     Capillary Refill: Capillary refill takes 2 to 3 seconds.  Neurological:     General: No focal deficit present.     Mental Status: She is alert and oriented to person, place, and time. Mental status is at baseline.  Psychiatric:        Mood and Affect: Mood normal.        Behavior: Behavior normal.        Thought Content: Thought content normal.        Judgment: Judgment normal.     BP (!) 143/84   Pulse 84   Ht 5' 3 (1.6 m)   Wt 230 lb (104.3 kg)   BMI 40.74 kg/m   Past Medical History:  Diagnosis Date   Allergy    Asthma    Chronic vaginitis    Diabetes mellitus without complication (HCC)    Dyspareunia in female    Eczema    FH: breast cancer    FH: colon cancer    GERD (gastroesophageal reflux disease)    Hiatus hernia syndrome    Hyperlipidemia    Hypertension    Hypothyroidism    Morbid obesity (HCC)  Postmenopausal    Sleep apnea    Superficial varicosities    Vaginal atrophy     Social History   Socioeconomic History   Marital status: Married    Spouse name: Not on file   Number of children: Not on file   Years of education: Not on file   Highest education level: Not on file  Occupational History   Not on file  Tobacco Use   Smoking status: Never   Smokeless tobacco: Never  Vaping Use   Vaping status: Never Used  Substance and Sexual Activity   Alcohol use: No    Alcohol/week: 0.0 standard drinks of alcohol   Drug use: No   Sexual activity: Not Currently    Birth control/protection: Surgical    Comment: hysterectomy  Other Topics Concern   Not on file  Social History Narrative   Not on file   Social Drivers of Health   Financial Resource Strain: Low Risk  (08/23/2023)   Received from [facility]   Overall Financial Resource Strain (CARDIA)    Difficulty of Paying Living Expenses: Not hard at all  Food Insecurity: No Food Insecurity (08/23/2023)   Received from [facility]   Hunger Vital  Sign    Within the past 12 months, you worried that your food would run out before you got the money to buy more.: Never true    Within the past 12 months, the food you bought just didn't last and you didn't have money to get more.: Never true  Transportation Needs: No Transportation Needs (08/23/2023)   Received from [facility] - Transportation    In the past 12 months, has lack of transportation kept you from medical appointments or from getting medications?: No    Lack of Transportation (Non-Medical): No  Physical Activity: Insufficiently Active (07/14/2017)   Exercise Vital Sign    Days of Exercise per Week: 2 days    Minutes of Exercise per Session: 30 min  Stress: Not on file  Social Connections: Not on file  Intimate Partner Violence: Not on file    Past Surgical History:  Procedure Laterality Date   BIOPSY  09/14/2022   Procedure: BIOPSY;  Surgeon: Maryruth Ole DASEN, MD;  Location: [facility] ENDOSCOPY;  Service: Endoscopy;;   BREAST EXCISIONAL BIOPSY Right 1996   neg   BREAST SURGERY     biopsy   CHOLECYSTECTOMY     COLONOSCOPY WITH PROPOFOL  N/A 10/12/2014   Procedure: COLONOSCOPY WITH PROPOFOL ;  Surgeon: Lamar DASEN Holmes, MD;  Location[facility] ENDOSCOPY;  Service: Endoscopy;  Laterality: N/A;   COLONOSCOPY WITH PROPOFOL  N/A 09/14/2022   Procedure: COLONOSCOPY WITH PROPOFOL ;  Surgeon: Maryruth Ole DASEN, MD;  Location: [facility] ENDOSCOPY;  Service: Endoscopy;  Laterality: N/A;   DILATION AND CURETTAGE OF UTERUS     ESOPHAGOGASTRODUODENOSCOPY N/A 09/14/2022   Procedure: ESOPHAGOGASTRODUODENOSCOPY (EGD);  Surgeon: Maryruth Ole DASEN, MD;  Location: [facility] ENDOSCOPY;  Service: Endoscopy;  Laterality: N/A;   ESOPHAGOGASTRODUODENOSCOPY (EGD) WITH PROPOFOL  N/A 10/12/2014   Procedure: ESOPHAGOGASTRODUODENOSCOPY (EGD) WITH PROPOFOL ;  Surgeon: Lamar DASEN Holmes, MD;  Location: [facility] ENDOSCOPY;  Service: Endoscopy;  Laterality: N/A;   NASAL SINUS SURGERY     VAGINAL  HYSTERECTOMY  1982    Family History  Problem Relation Age of Onset   Osteoporosis Mother    Breast cancer Maternal Aunt 76   Diabetes Maternal Grandmother    Diabetes Maternal Grandfather    Heart disease Maternal Grandfather  Colon cancer Cousin    Breast cancer Cousin 40       mat cousin   Cancer Father    Ovarian cancer Neg Hx     Allergies  Allergen Reactions   Cisapride Hives   Sulfa Antibiotics Hives   Biaxin [Clarithromycin]        Latest Ref Rng & Units 11/04/2023    9:51 AM 11/06/2014   11:35 AM 06/05/2014    2:39 PM  CBC  WBC 3.4 - 10.8 x10E3/uL 10.7  11.4  15.3   Hemoglobin 11.1 - 15.9 g/dL 86.7  88.1  87.1   Hematocrit 34.0 - 46.6 % 41.5  35.5  38.7   Platelets 150 - 450 x10E3/uL 360  284  308       CMP     Component Value Date/Time   NA 141 11/04/2023 0951   K 4.2 11/04/2023 0951   CL 100 11/04/2023 0951   CO2 24 11/04/2023 0951   GLUCOSE 119 (H) 11/04/2023 0951   BUN 16 11/04/2023 0951   CREATININE 1.00 11/04/2023 0951   CALCIUM  9.8 11/04/2023 0951   PROT 6.9 11/04/2023 0951   ALBUMIN 4.6 11/04/2023 0951   AST 15 11/04/2023 0951   ALT 15 11/04/2023 0951   ALKPHOS 101 11/04/2023 0951   BILITOT 0.4 11/04/2023 0951   EGFR 59 (L) 11/04/2023 0951     No results found.     Assessment & Plan:   1. Chronic venous insufficiency (Primary) Recommend:  I have had a long discussion with the patient regarding swelling and why it  causes symptoms.  Patient will begin wearing graduated compression on a daily basis a prescription was given. The patient will  wear the stockings first thing in the morning and removing them in the evening. The patient is instructed specifically not to sleep in the stockings.   In addition, behavioral modification will be initiated.  This will include frequent elevation, use of over the counter pain medications and exercise such as walking.  Consideration for a lymph pump will also be made based upon the effectiveness of  conservative therapy.  This would help to improve the edema control and prevent sequela such as ulcers and infections. Patient instructed to follow up with us  if she is not able to control edema.   The patient will follow-up with me as needed.  2. Hypertension associated with diabetes (HCC) Continue antihypertensive medications as already ordered, these medications have been reviewed and there are no changes at this time.  3. Type 2 diabetes mellitus with hyperglycemia, without long-term current use of insulin (HCC) Continue hypoglycemic medications as already ordered, these medications have been reviewed and there are no changes at this time.  Hgb A1C to be monitored as already arranged by primary service  4. Obesity, morbid (HCC) We had a long 20 minute conversation regarding her weight and how it effects the swelling of her legs. The conversation was centered around diet and exercise but also setting achievable goals regarding weight loss. Setting expectations that are unreasonable leads to failure and weight gain is never from one source only. We discussed approaching this globally looking at finding a dietician to see for meals that will co inside with her diabetes, exercise program to include Marich weights and walking and doing conservative therapy such as compression and elevation. I also encouraged her to follow up with her PCP to discuss weight loss medications as she is diabetic and should qualify for insurance coverage to help with  cost. Most of all I encouraged her to be patient in the process and make losing weight fun and don't give up.     Current Outpatient Medications on File Prior to Visit  Medication Sig Dispense Refill   ACCU-CHEK AVIVA PLUS test strip      acetaminophen (TYLENOL) 650 MG CR tablet Take 650 mg by mouth every 8 (eight) hours as needed for pain.     albuterol  (VENTOLIN  HFA) 108 (90 Base) MCG/ACT inhaler Inhale 2 puffs into the lungs every 6 (six) hours as needed  for wheezing or shortness of breath.     Alcohol Swabs (DROPSAFE ALCOHOL PREP) 70 % PADS      amLODipine  (NORVASC ) 5 MG tablet Take 1 tablet (5 mg total) by mouth daily. 90 tablet 3   aspirin  EC 81 MG tablet Take 1 tablet (81 mg total) by mouth daily. 90 tablet 3   Biotin 5000 MCG TABS Take 1 tablet by mouth daily.     cetirizine (ZYRTEC) 10 MG tablet Take 10 mg by mouth daily.     dapagliflozin propanediol (FARXIGA) 10 MG TABS tablet Take by mouth daily.     ferrous sulfate 325 (65 FE) MG tablet Take 325 mg by mouth daily with breakfast.     fluticasone  (FLOVENT  HFA) 110 MCG/ACT inhaler Inhale 2 puffs into the lungs 2 (two) times daily. 1 each 5   glipiZIDE  (GLUCOTROL  XL) 5 MG 24 hr tablet Take 1 tablet (5 mg total) by mouth daily with breakfast. 90 tablet 3   levothyroxine  (SYNTHROID ) 125 MCG tablet Take 1 tablet (125 mcg total) by mouth daily. 90 tablet 3   losartan -hydrochlorothiazide (HYZAAR) 100-25 MG tablet Take 1 tablet by mouth daily. 90 tablet 3   metFORMIN  (GLUCOPHAGE ) 1000 MG tablet Take 1 tablet (1,000 mg total) by mouth 2 (two) times daily with a meal. 180 tablet 3   mometasone  (ASMANEX ) 220 MCG/ACT inhaler Inhale 1 puff into the lungs.     Omega-3 Fatty Acids (FISH OIL CONCENTRATE) 300 MG CAPS Take by mouth.     pantoprazole  (PROTONIX ) 40 MG tablet Take 1 tablet (40 mg total) by mouth daily. 90 tablet 3   Turmeric Curcumin 500 MG CAPS Take by mouth.     No current facility-administered medications on file prior to visit.    There are no Patient Instructions on file for this visit. No follow-ups on file.   Danielle JONELLE Shank, NP

## 2023-12-15 DIAGNOSIS — R809 Proteinuria, unspecified: Secondary | ICD-10-CM | POA: Diagnosis not present

## 2023-12-15 DIAGNOSIS — E1129 Type 2 diabetes mellitus with other diabetic kidney complication: Secondary | ICD-10-CM | POA: Diagnosis not present

## 2023-12-15 DIAGNOSIS — I10 Essential (primary) hypertension: Secondary | ICD-10-CM | POA: Diagnosis not present

## 2024-02-01 ENCOUNTER — Encounter: Payer: Self-pay | Admitting: Internal Medicine

## 2024-02-01 ENCOUNTER — Ambulatory Visit: Admitting: Internal Medicine

## 2024-02-01 ENCOUNTER — Ambulatory Visit: Payer: Self-pay | Admitting: Internal Medicine

## 2024-02-01 VITALS — BP 128/76 | HR 90 | Ht 63.0 in | Wt 232.0 lb

## 2024-02-01 DIAGNOSIS — G4733 Obstructive sleep apnea (adult) (pediatric): Secondary | ICD-10-CM | POA: Diagnosis not present

## 2024-02-01 DIAGNOSIS — K219 Gastro-esophageal reflux disease without esophagitis: Secondary | ICD-10-CM | POA: Diagnosis not present

## 2024-02-01 DIAGNOSIS — E1165 Type 2 diabetes mellitus with hyperglycemia: Secondary | ICD-10-CM

## 2024-02-01 DIAGNOSIS — E038 Other specified hypothyroidism: Secondary | ICD-10-CM | POA: Diagnosis not present

## 2024-02-01 DIAGNOSIS — I152 Hypertension secondary to endocrine disorders: Secondary | ICD-10-CM

## 2024-02-01 DIAGNOSIS — Z13820 Encounter for screening for osteoporosis: Secondary | ICD-10-CM

## 2024-02-01 DIAGNOSIS — E782 Mixed hyperlipidemia: Secondary | ICD-10-CM

## 2024-02-01 DIAGNOSIS — Z6841 Body Mass Index (BMI) 40.0 and over, adult: Secondary | ICD-10-CM

## 2024-02-01 DIAGNOSIS — E1169 Type 2 diabetes mellitus with other specified complication: Secondary | ICD-10-CM

## 2024-02-01 DIAGNOSIS — E1159 Type 2 diabetes mellitus with other circulatory complications: Secondary | ICD-10-CM

## 2024-02-01 LAB — POCT CBG (FASTING - GLUCOSE)-MANUAL ENTRY: Glucose Fasting, POC: 285 mg/dL — AB (ref 70–99)

## 2024-02-01 MED ORDER — FAMOTIDINE 20 MG PO TABS: 20.0000 mg | ORAL_TABLET | Freq: Every day | ORAL | 1 refills | Status: AC

## 2024-02-01 NOTE — Progress Notes (Signed)
 Established Patient Office Visit  Subjective:  Patient ID: Danielle Delgado, female    DOB: December 21, 1950  Age: 73 y.o. MRN: [id number]  Chief Complaint  Patient presents with   Follow-up    3 month follow up    Patient is here today for follow up. She reports osteoarthritis in her knees have been bothering her recently. She last saw orthopedics 08/2023 for joint injections. She knows she needs knee replacement surgery but has been postponing it. Recommend patient schedule appointment to get injections.  She endorses reflux symptoms even with taking Protonix  40 mg daily. She reports not always waiting 30 minutes before eating. Reinforced dietary triggers and recommend patient FU with her GI specialist if symptoms persist.GI specialist office visit 06/2022 recommend patient take pepcid PRN in evening in addition to protonix . 08/2022 EGD showed gastritis. Recommend patient starting PRN pepcid and FU if symptoms persist.  Patient reports she had her AWV around 08/2023 prior to transitioning to our practice. Patient declines wanting flu shot at this time. She is due for routine labs in a few days. Patient will return fasting at that time for blood work.    No other concerns at this time.   Past Medical History:  Diagnosis Date   Allergy    Asthma    Chronic vaginitis    Diabetes mellitus without complication (HCC)    Dyspareunia in female    Eczema    FH: breast cancer    FH: colon cancer    GERD (gastroesophageal reflux disease)    Hiatus hernia syndrome    Hyperlipidemia    Hypertension    Hypothyroidism    Morbid obesity (HCC)    Postmenopausal    Sleep apnea    Superficial varicosities    Vaginal atrophy     Past Surgical History:  Procedure Laterality Date   BIOPSY  09/14/2022   Procedure: BIOPSY;  Surgeon: Maryruth Ole DASEN, MD;  Location: [facility] ENDOSCOPY;  Service: Endoscopy;;   BREAST EXCISIONAL BIOPSY Right 1996   neg   BREAST SURGERY     biopsy   CHOLECYSTECTOMY      COLONOSCOPY WITH PROPOFOL  N/A 10/12/2014   Procedure: COLONOSCOPY WITH PROPOFOL ;  Surgeon: Lamar DASEN Holmes, MD;  Location: [facility] ENDOSCOPY;  Service: Endoscopy;  Laterality: N/A;   COLONOSCOPY WITH PROPOFOL  N/A 09/14/2022   Procedure: COLONOSCOPY WITH PROPOFOL ;  Surgeon: Maryruth Ole DASEN, MD;  Location: [facility] ENDOSCOPY;  Service: Endoscopy;  Laterality: N/A;   DILATION AND CURETTAGE OF UTERUS     ESOPHAGOGASTRODUODENOSCOPY N/A 09/14/2022   Procedure: ESOPHAGOGASTRODUODENOSCOPY (EGD);  Surgeon: Maryruth Ole DASEN, MD;  Location: [facility] ENDOSCOPY;  Service: Endoscopy;  Laterality: N/A;   ESOPHAGOGASTRODUODENOSCOPY (EGD) WITH PROPOFOL  N/A 10/12/2014   Procedure: ESOPHAGOGASTRODUODENOSCOPY (EGD) WITH PROPOFOL ;  Surgeon: Lamar DASEN Holmes, MD;  Location: [facility] ENDOSCOPY;  Service: Endoscopy;  Laterality: N/A;   NASAL SINUS SURGERY     VAGINAL HYSTERECTOMY  1982    Social History   Socioeconomic History   Marital status: Married    Spouse name: Not on file   Number of children: Not on file   Years of education: Not on file   Highest education level: Not on file  Occupational History   Not on file  Tobacco Use   Smoking status: Never   Smokeless tobacco: Never  Vaping Use   Vaping status: Never Used  Substance and Sexual Activity   Alcohol use: No    Alcohol/week: 0.0 standard drinks of alcohol   Drug use:  No   Sexual activity: Not Currently    Birth control/protection: Surgical    Comment: hysterectomy  Other Topics Concern   Not on file  Social History Narrative   Not on file   Social Drivers of Health   Financial Resource Strain: Low Risk  (08/23/2023)   Received from [facility]   Overall Financial Resource Strain (CARDIA)    Difficulty of Paying Living Expenses: Not hard at all  Food Insecurity: No Food Insecurity (08/23/2023)   Received from [facility]   Hunger Vital Sign    Within the past 12 months, you worried that your food would  run out before you got the money to buy more.: Never true    Within the past 12 months, the food you bought just didn't last and you didn't have money to get more.: Never true  Transportation Needs: No Transportation Needs (08/23/2023)   Received from [facility] - Transportation    In the past 12 months, has lack of transportation kept you from medical appointments or from getting medications?: No    Lack of Transportation (Non-Medical): No  Physical Activity: Insufficiently Active (07/14/2017)   Exercise Vital Sign    Days of Exercise per Week: 2 days    Minutes of Exercise per Session: 30 min  Stress: Not on file  Social Connections: Not on file  Intimate Partner Violence: Not on file    Family History  Problem Relation Age of Onset   Osteoporosis Mother    Breast cancer Maternal Aunt 60   Diabetes Maternal Grandmother    Diabetes Maternal Grandfather    Heart disease Maternal Grandfather    Colon cancer Cousin    Breast cancer Cousin 40       mat cousin   Cancer Father    Ovarian cancer Neg Hx     Allergies  Allergen Reactions   Cisapride Hives   Sulfa Antibiotics Hives   Biaxin [Clarithromycin]     Outpatient Medications Prior to Visit  Medication Sig   ACCU-CHEK AVIVA PLUS test strip    acetaminophen (TYLENOL) 650 MG CR tablet Take 650 mg by mouth every 8 (eight) hours as needed for pain.   albuterol  (VENTOLIN  HFA) 108 (90 Base) MCG/ACT inhaler Inhale 2 puffs into the lungs every 6 (six) hours as needed for wheezing or shortness of breath.   Alcohol Swabs (DROPSAFE ALCOHOL PREP) 70 % PADS    amLODipine  (NORVASC ) 5 MG tablet Take 1 tablet (5 mg total) by mouth daily.   aspirin  EC 81 MG tablet Take 1 tablet (81 mg total) by mouth daily.   atorvastatin  (LIPITOR) 10 MG tablet Take 1 tablet (10 mg total) by mouth daily.   Biotin 5000 MCG TABS Take 1 tablet by mouth daily.   cetirizine (ZYRTEC) 10 MG tablet Take 10 mg by mouth daily.    dapagliflozin propanediol (FARXIGA) 10 MG TABS tablet Take by mouth daily.   ferrous sulfate 325 (65 FE) MG tablet Take 325 mg by mouth daily with breakfast.   glipiZIDE  (GLUCOTROL  XL) 5 MG 24 hr tablet Take 1 tablet (5 mg total) by mouth daily with breakfast.   levothyroxine  (SYNTHROID ) 125 MCG tablet Take 1 tablet (125 mcg total) by mouth daily.   losartan -hydrochlorothiazide (HYZAAR) 100-25 MG tablet Take 1 tablet by mouth daily.   metFORMIN  (GLUCOPHAGE ) 1000 MG tablet Take 1 tablet (1,000 mg total) by mouth 2 (two) times daily with a meal.  montelukast  (SINGULAIR ) 10 MG tablet TAKE 1 TABLET EVERY DAY   Omega-3 Fatty Acids (FISH OIL CONCENTRATE) 300 MG CAPS Take by mouth.   pantoprazole  (PROTONIX ) 40 MG tablet Take 1 tablet (40 mg total) by mouth daily.   Turmeric Curcumin 500 MG CAPS Take by mouth.   [DISCONTINUED] fluticasone  (FLOVENT  HFA) 110 MCG/ACT inhaler Inhale 2 puffs into the lungs 2 (two) times daily. (Patient not taking: Reported on 02/01/2024)   [DISCONTINUED] mometasone  (ASMANEX ) 220 MCG/ACT inhaler Inhale 1 puff into the lungs. (Patient not taking: Reported on 02/01/2024)   No facility-administered medications prior to visit.    Review of Systems  Constitutional: Negative.  Negative for chills, fever and malaise/fatigue.  HENT: Negative.  Negative for congestion and sore throat.   Eyes: Negative.  Negative for blurred vision and pain.  Respiratory: Negative.  Negative for cough and shortness of breath.   Cardiovascular: Negative.  Negative for chest pain, palpitations and leg swelling.  Gastrointestinal:  Positive for heartburn. Negative for abdominal pain, blood in stool, constipation, diarrhea, melena, nausea and vomiting.  Genitourinary: Negative.  Negative for dysuria, flank pain, frequency and urgency.  Musculoskeletal:  Positive for joint pain. Negative for myalgias.  Skin: Negative.   Neurological: Negative.  Negative for dizziness, tingling, sensory change, weakness  and headaches.  Endo/Heme/Allergies: Negative.   Psychiatric/Behavioral: Negative.  Negative for depression and suicidal ideas. The patient is not nervous/anxious.        Objective:   BP 128/76   Pulse 90   Ht 5' 3 (1.6 m)   Wt 232 lb (105.2 kg)   SpO2 97%   BMI 41.10 kg/m   Vitals:   02/01/24 1407  BP: 128/76  Pulse: 90  Height: 5' 3 (1.6 m)  Weight: 232 lb (105.2 kg)  SpO2: 97%  BMI (Calculated): 41.11    Physical Exam Vitals and nursing note reviewed.  Constitutional:      Appearance: Normal appearance.  HENT:     Head: Normocephalic and atraumatic.     Nose: Nose normal.     Mouth/Throat:     Mouth: Mucous membranes are moist.     Pharynx: Oropharynx is clear.  Eyes:     Conjunctiva/sclera: Conjunctivae normal.     Pupils: Pupils are equal, round, and reactive to [place].  Cardiovascular:     Rate and Rhythm: Normal rate and regular rhythm.     Pulses: Normal pulses.     Heart sounds: Normal heart sounds. No murmur heard. Pulmonary:     Effort: Pulmonary effort is normal.     Breath sounds: Normal breath sounds. No wheezing.  Abdominal:     General: Bowel sounds are normal.     Palpations: Abdomen is soft.     Tenderness: There is no abdominal tenderness. There is no right CVA tenderness or left CVA tenderness.  Musculoskeletal:        General: Normal range of motion.     Cervical back: Normal range of motion.     Right lower leg: No edema.     Left lower leg: No edema.  Skin:    General: Skin is warm and dry.  Neurological:     General: No focal deficit present.     Mental Status: She is alert and oriented to person, place, and time.  Psychiatric:        Mood and Affect: Mood normal.        Behavior: Behavior normal.      Results for orders placed  or performed in visit on 02/01/24  POCT CBG (Fasting - Glucose)  Result Value Ref Range   Glucose Fasting, POC 285 (A) 70 - 99 mg/dL    Recent Results (from the past 2160 hours)  CBC with Diff      Status: Abnormal   Collection Time: 11/04/23  9:51 AM  Result Value Ref Range   WBC 10.7 3.4 - 10.8 x10E3/uL   RBC 4.26 3.77 - 5.28 x10E6/uL   Hemoglobin 13.2 11.1 - 15.9 g/dL   Hematocrit 58.4 65.9 - 46.6 %   MCV 97 79 - 97 fL   MCH 31.0 26.6 - 33.0 pg   MCHC 31.8 31.5 - 35.7 g/dL   RDW 87.2 88.2 - 84.5 %   Platelets 360 150 - 450 x10E3/uL   Neutrophils 59 Not Estab. %   Lymphs 30 Not Estab. %   Monocytes 7 Not Estab. %   Eos 2 Not Estab. %   Basos 1 Not Estab. %   Neutrophils Absolute 6.3 1.4 - 7.0 x10E3/uL   Lymphocytes Absolute 3.3 (H) 0.7 - 3.1 x10E3/uL   Monocytes Absolute 0.7 0.1 - 0.9 x10E3/uL   EOS (ABSOLUTE) 0.3 0.0 - 0.4 x10E3/uL   Basophils Absolute 0.1 0.0 - 0.2 x10E3/uL   Immature Granulocytes 1 Not Estab. %   Immature Grans (Abs) 0.1 0.0 - 0.1 x10E3/uL  CMP14+EGFR     Status: Abnormal   Collection Time: 11/04/23  9:51 AM  Result Value Ref Range   Glucose 119 (H) 70 - 99 mg/dL   BUN 16 8 - 27 mg/dL   Creatinine, Ser 8.99 0.57 - 1.00 mg/dL   eGFR 59 (L) >40 fO/fpw/8.26   BUN/Creatinine Ratio 16 12 - 28   Sodium 141 134 - 144 mmol/L   Potassium 4.2 3.5 - 5.2 mmol/L   Chloride 100 96 - 106 mmol/L   CO2 24 20 - 29 mmol/L   Calcium  9.8 8.7 - 10.3 mg/dL   Total Protein 6.9 6.0 - 8.5 g/dL   Albumin 4.6 3.8 - 4.8 g/dL   Globulin, Total 2.3 1.5 - 4.5 g/dL   Bilirubin Total 0.4 0.0 - 1.2 mg/dL   Alkaline Phosphatase 101 44 - 121 IU/L    Comment: **Effective November 15, 2023 Alkaline Phosphatase**   reference interval will be changing to:              Age                Female          Female           0 -  5 days         47 - 127       47 - 127           6 - 10 days         29 - 242       29 - 242          11 - 20 days        109 - 357      109 - 357          21 - 30 days         94 - 494       94 - 494           1 -  2 months      149 - 539      149 -  539           3 -  6 months      131 - 452      131 - 452           7 - 11 months      117 - 401      117 -  401   12 months -  6 years       158 - 369      158 - 369           7 - 12 years       150 - 409      150 - 409               13 years       156 - 435       78 - 227               14 years       114 - 375       64 - 161               15 years        88 - 279       56 - 134               16 years        74 - 207       51 - 121               17 years        63 - 161       47 - 113          18 - 20 years        51 - 125       42 - 106          21 - 50 years         47 - 123       41 - 116          51 - 80 years        49 - 135       51 - 125              >80 years        48 - 129       48 - 129    AST 15 0 - 40 IU/L   ALT 15 0 - 32 IU/L  Lipid Panel w/o Chol/HDL Ratio     Status: Abnormal   Collection Time: 11/04/23  9:51 AM  Result Value Ref Range   Cholesterol, Total 144 100 - 199 mg/dL   Triglycerides 838 (H) 0 - 149 mg/dL   HDL 50 >60 mg/dL   VLDL Cholesterol Cal 27 5 - 40 mg/dL   LDL Chol Calc (NIH) 67 0 - 99 mg/dL  Hemoglobin J8r     Status: Abnormal   Collection Time: 11/04/23  9:51 AM  Result Value Ref Range   Hgb A1c MFr Bld 6.9 (H) 4.8 - 5.6 %    Comment:          Prediabetes: 5.7 - 6.4          Diabetes: >6.4          Glycemic control for adults with diabetes: <7.0    Est. average glucose Bld gHb Est-mCnc 151 mg/dL  TSH+T4F+T3Free     Status: Abnormal   Collection Time: 11/04/23  9:51 AM  Result Value Ref Range   TSH 2.560 0.450 - 4.500 uIU/mL   T3, Free 2.4 2.0 - 4.4 pg/mL   Free T4 1.78 (H) 0.82 - 1.77 ng/dL  POCT CBG (Fasting - Glucose)     Status: Abnormal   Collection Time: 02/01/24  2:13 PM  Result Value Ref Range   Glucose Fasting, POC 285 (A) 70 - 99 mg/dL      Assessment & Plan:  Start PRN pepcid. Continue other medications as prescribed. FU with GI as needed. FU with orthopedics for knee injections. DEXA scan needs to be completed. Check routine labs in future when patient returns fasting. Problem List Items Addressed This Visit     OSA on CPAP    Type 2 diabetes mellitus with hyperglycemia, without long-term current use of insulin (HCC)   Relevant Orders   POCT CBG (Fasting - Glucose) (Completed)   Hemoglobin A1c   Combined hyperlipidemia associated with type 2 diabetes mellitus (HCC)   Relevant Orders   Lipid Panel w/o Chol/HDL Ratio   Hypertension associated with diabetes (HCC) - Primary   Relevant Orders   CMP14+EGFR   CBC with Diff   Other specified hypothyroidism   Relevant Orders   TSH   Screening for osteoporosis   Relevant Orders   DG Bone Density   Gastroesophageal reflux disease without esophagitis   Relevant Medications   famotidine (PEPCID) 20 MG tablet   Morbid obesity with body mass index (BMI) of 40.0 to 44.9 in adult [facility])    Return in about 3 months (around 05/01/2024).   Total time spent: 25 minutes. This time includes review of previous notes and results and patient face to face interaction during today's visit.    FERNAND FREDY RAMAN, MD  02/01/2024   This document may have been prepared by [facility] Voice Recognition software and as such may include unintentional dictation errors.

## 2024-02-02 ENCOUNTER — Other Ambulatory Visit: Payer: Self-pay | Admitting: Nurse Practitioner

## 2024-02-02 ENCOUNTER — Other Ambulatory Visit

## 2024-02-02 DIAGNOSIS — E782 Mixed hyperlipidemia: Secondary | ICD-10-CM | POA: Diagnosis not present

## 2024-02-02 DIAGNOSIS — E1159 Type 2 diabetes mellitus with other circulatory complications: Secondary | ICD-10-CM

## 2024-02-02 DIAGNOSIS — I152 Hypertension secondary to endocrine disorders: Secondary | ICD-10-CM | POA: Diagnosis not present

## 2024-02-02 DIAGNOSIS — E1165 Type 2 diabetes mellitus with hyperglycemia: Secondary | ICD-10-CM | POA: Diagnosis not present

## 2024-02-02 DIAGNOSIS — E1169 Type 2 diabetes mellitus with other specified complication: Secondary | ICD-10-CM | POA: Diagnosis not present

## 2024-02-02 DIAGNOSIS — E038 Other specified hypothyroidism: Secondary | ICD-10-CM

## 2024-02-03 LAB — CMP14+EGFR
ALT: 12 IU/L (ref 0–32)
AST: 13 IU/L (ref 0–40)
Albumin: 4.5 g/dL (ref 3.8–4.8)
Alkaline Phosphatase: 96 IU/L (ref 49–135)
BUN/Creatinine Ratio: 21 (ref 12–28)
BUN: 20 mg/dL (ref 8–27)
Bilirubin Total: 0.4 mg/dL (ref 0.0–1.2)
CO2: 24 mmol/L (ref 20–29)
Calcium: 10 mg/dL (ref 8.7–10.3)
Chloride: 100 mmol/L (ref 96–106)
Creatinine, Ser: 0.97 mg/dL (ref 0.57–1.00)
Globulin, Total: 2.2 g/dL (ref 1.5–4.5)
Glucose: 130 mg/dL — ABNORMAL HIGH (ref 70–99)
Potassium: 4.6 mmol/L (ref 3.5–5.2)
Sodium: 141 mmol/L (ref 134–144)
Total Protein: 6.7 g/dL (ref 6.0–8.5)
eGFR: 61 mL/min/1.73 (ref >59)

## 2024-02-03 LAB — CBC WITH DIFFERENTIAL/PLATELET
Basophils Absolute: 0.1 x10E3/uL (ref 0.0–0.2)
Basos: 1 %
EOS (ABSOLUTE): 0.4 x10E3/uL (ref 0.0–0.4)
Eos: 4 %
Hematocrit: 40.7 % (ref 34.0–46.6)
Hemoglobin: 12.8 g/dL (ref 11.1–15.9)
Immature Grans (Abs): 0 x10E3/uL (ref 0.0–0.1)
Immature Granulocytes: 0 %
Lymphocytes Absolute: 2.7 x10E3/uL (ref 0.7–3.1)
Lymphs: 25 %
MCH: 29.6 pg (ref 26.6–33.0)
MCHC: 31.4 g/dL — ABNORMAL LOW (ref 31.5–35.7)
MCV: 94 fL (ref 79–97)
Monocytes Absolute: 0.8 x10E3/uL (ref 0.1–0.9)
Monocytes: 7 %
Neutrophils Absolute: 6.8 x10E3/uL (ref 1.4–7.0)
Neutrophils: 63 %
Platelets: 351 x10E3/uL (ref 150–450)
RBC: 4.32 x10E6/uL (ref 3.77–5.28)
RDW: 12.9 % (ref 11.7–15.4)
WBC: 10.8 x10E3/uL (ref 3.4–10.8)

## 2024-02-03 LAB — TSH: TSH: 2.46 u[IU]/mL (ref 0.450–4.500)

## 2024-02-03 LAB — HEMOGLOBIN A1C
Est. average glucose Bld gHb Est-mCnc: 151 mg/dL
Hgb A1c MFr Bld: 6.9 % — ABNORMAL HIGH (ref 4.8–5.6)

## 2024-02-03 LAB — LIPID PANEL W/O CHOL/HDL RATIO
Cholesterol, Total: 128 mg/dL (ref 100–199)
HDL: 49 mg/dL (ref >39)
LDL Chol Calc (NIH): 61 mg/dL (ref 0–99)
Triglycerides: 94 mg/dL (ref 0–149)
VLDL Cholesterol Cal: 18 mg/dL (ref 5–40)

## 2024-02-07 ENCOUNTER — Encounter: Payer: Self-pay | Admitting: Internal Medicine

## 2024-02-14 ENCOUNTER — Encounter: Payer: Self-pay | Admitting: Internal Medicine

## 2024-02-14 ENCOUNTER — Encounter: Payer: Self-pay | Admitting: Nurse Practitioner

## 2024-02-14 ENCOUNTER — Ambulatory Visit: Payer: Medicare HMO | Admitting: Nurse Practitioner

## 2024-02-14 ENCOUNTER — Ambulatory Visit: Admitting: Internal Medicine

## 2024-02-14 VITALS — BP 110/60 | HR 81 | Temp 95.9°F | Resp 16 | Ht 63.0 in | Wt 227.8 lb

## 2024-02-14 VITALS — BP 128/74 | HR 92 | Ht 63.0 in | Wt 229.8 lb

## 2024-02-14 DIAGNOSIS — G4733 Obstructive sleep apnea (adult) (pediatric): Secondary | ICD-10-CM

## 2024-02-14 DIAGNOSIS — J4520 Mild intermittent asthma, uncomplicated: Secondary | ICD-10-CM

## 2024-02-14 DIAGNOSIS — I152 Hypertension secondary to endocrine disorders: Secondary | ICD-10-CM | POA: Diagnosis not present

## 2024-02-14 DIAGNOSIS — E1165 Type 2 diabetes mellitus with hyperglycemia: Secondary | ICD-10-CM | POA: Diagnosis not present

## 2024-02-14 DIAGNOSIS — E1169 Type 2 diabetes mellitus with other specified complication: Secondary | ICD-10-CM | POA: Diagnosis not present

## 2024-02-14 DIAGNOSIS — E1159 Type 2 diabetes mellitus with other circulatory complications: Secondary | ICD-10-CM | POA: Diagnosis not present

## 2024-02-14 DIAGNOSIS — Z6841 Body Mass Index (BMI) 40.0 and over, adult: Secondary | ICD-10-CM | POA: Diagnosis not present

## 2024-02-14 DIAGNOSIS — Z7189 Other specified counseling: Secondary | ICD-10-CM

## 2024-02-14 DIAGNOSIS — E782 Mixed hyperlipidemia: Secondary | ICD-10-CM | POA: Diagnosis not present

## 2024-02-14 MED ORDER — TIRZEPATIDE 5 MG/0.5ML ~~LOC~~ SOAJ: 5.0000 mg | SUBCUTANEOUS | 1 refills | Status: DC

## 2024-02-14 NOTE — Progress Notes (Signed)
 Established Patient Office Visit  Subjective:  Patient ID: Danielle Delgado, female    DOB: 01-02-1951  Age: 73 y.o. MRN: [id number]  Chief Complaint  Patient presents with   Follow-up    3 week follow up     Patient is here today for follow up. She reports doing well and has no concerns today.  We discussed recent labs in detail today. Her HbgA1c is stable at 6.9% but patient could benefit from enhanced sugar control and weight loss. Will provide patient with Mounjaro  2.5 mg weekly injection and send in prescription for 5 mg weekly injection to begin after 1 month. Sent V[facility]I referral to discuss financial cost of medications and help with out of pocket cost if appropriate. Reinforced healthy diet and exercise as tolerated.  She denies any chest pain, shortness of breath, palpitations at this time. Patient reports having appointment 03/2024 to get injections in her knees and discuss bilateral knee replacement surgery.      No other concerns at this time.   Past Medical History:  Diagnosis Date   Allergy    Asthma    Chronic vaginitis    Diabetes mellitus without complication (HCC)    Dyspareunia in female    Eczema    FH: breast cancer    FH: colon cancer    GERD (gastroesophageal reflux disease)    Hiatus hernia syndrome    Hyperlipidemia    Hypertension    Hypothyroidism    Morbid obesity (HCC)    Postmenopausal    Sleep apnea    Superficial varicosities    Vaginal atrophy     Past Surgical History:  Procedure Laterality Date   BIOPSY  09/14/2022   Procedure: BIOPSY;  Surgeon: Maryruth Ole DASEN, MD;  Location: [facility] ENDOSCOPY;  Service: Endoscopy;;   BREAST EXCISIONAL BIOPSY Right 1996   neg   BREAST SURGERY     biopsy   CHOLECYSTECTOMY     COLONOSCOPY WITH PROPOFOL  N/A 10/12/2014   Procedure: COLONOSCOPY WITH PROPOFOL ;  Surgeon: Lamar DASEN Holmes, MD;  Location: [facility] ENDOSCOPY;  Service: Endoscopy;  Laterality: N/A;   COLONOSCOPY WITH PROPOFOL  N/A 09/14/2022    Procedure: COLONOSCOPY WITH PROPOFOL ;  Surgeon: Maryruth Ole DASEN, MD;  Location: [facility] ENDOSCOPY;  Service: Endoscopy;  Laterality: N/A;   DILATION AND CURETTAGE OF UTERUS     ESOPHAGOGASTRODUODENOSCOPY N/A 09/14/2022   Procedure: ESOPHAGOGASTRODUODENOSCOPY (EGD);  Surgeon: Maryruth Ole DASEN, MD;  Location: [facility] ENDOSCOPY;  Service: Endoscopy;  Laterality: N/A;   ESOPHAGOGASTRODUODENOSCOPY (EGD) WITH PROPOFOL  N/A 10/12/2014   Procedure: ESOPHAGOGASTRODUODENOSCOPY (EGD) WITH PROPOFOL ;  Surgeon: Lamar DASEN Holmes, MD;  Location: [facility] ENDOSCOPY;  Service: Endoscopy;  Laterality: N/A;   NASAL SINUS SURGERY     VAGINAL HYSTERECTOMY  1982    Social History   Socioeconomic History   Marital status: Married    Spouse name: Not on file   Number of children: Not on file   Years of education: Not on file   Highest education level: Not on file  Occupational History   Not on file  Tobacco Use   Smoking status: Never   Smokeless tobacco: Never  Vaping Use   Vaping status: Never Used  Substance and Sexual Activity   Alcohol use: No    Alcohol/week: 0.0 standard drinks of alcohol   Drug use: No   Sexual activity: Not Currently    Birth control/protection: Surgical    Comment: hysterectomy  Other Topics Concern   Not on file  Social History  Narrative   Not on file   Social Drivers of Health   Tobacco Use: Low Risk (02/01/2024)   Patient History    Smoking Tobacco Use: Never    Smokeless Tobacco Use: Never    Passive Exposure: Not on file  Financial Resource Strain: Low Risk  (08/23/2023)   Received from [facility]   Overall Financial Resource Strain (CARDIA)    Difficulty of Paying Living Expenses: Not hard at all  Food Insecurity: No Food Insecurity (08/23/2023)   Received from [facility]   Epic    Within the past 12 months, you worried that your food would run out before you got the money to buy more.: Never true    Within the past 12 months,  the food you bought just didn't last and you didn't have money to get more.: Never true  Transportation Needs: No Transportation Needs (08/23/2023)   Received from [facility] - Transportation    In the past 12 months, has lack of transportation kept you from medical appointments or from getting medications?: No    Lack of Transportation (Non-Medical): No  Physical Activity: Not on file  Stress: Not on file  Social Connections: Not on file  Intimate Partner Violence: Not on file  Depression (PHQ2-9): Low Risk (11/02/2023)   Depression (PHQ2-9)    PHQ-2 Score: 3  Alcohol Screen: Not on file  Housing: Low Risk  (08/23/2023)   Received from [facility]   Epic    In the last 12 months, was there a time when you were not able to pay the mortgage or rent on time?: No    In the past 12 months, how many times have you moved where you were living?: 0    At any time in the past 12 months, were you homeless or living in a shelter (including now)?: No  Utilities: Not At Risk (08/23/2023)   Received from [facility]   Epic    In the past 12 months has the electric, gas, oil, or water company threatened to shut off services in your home?: No  Health Literacy: Not on file    Family History  Problem Relation Age of Onset   Osteoporosis Mother    Breast cancer Maternal Aunt 60   Diabetes Maternal Grandmother    Diabetes Maternal Grandfather    Heart disease Maternal Grandfather    Colon cancer Cousin    Breast cancer Cousin 40       mat cousin   Cancer Father    Ovarian cancer Neg Hx     Allergies[1]  Show/hide medication list[2]  Review of Systems  Constitutional: Negative.  Negative for chills, fever and malaise/fatigue.  HENT: Negative.  Negative for congestion and sore throat.   Eyes: Negative.  Negative for blurred vision and pain.  Respiratory: Negative.  Negative for cough and shortness of breath.   Cardiovascular:  Negative.  Negative for chest pain, palpitations and leg swelling.  Gastrointestinal: Negative.  Negative for abdominal pain, blood in stool, constipation, diarrhea, heartburn, melena, nausea and vomiting.  Genitourinary: Negative.  Negative for dysuria, flank pain, frequency and urgency.  Musculoskeletal: Negative.  Negative for joint pain and myalgias.  Skin: Negative.   Neurological: Negative.  Negative for dizziness, tingling, sensory change, weakness and headaches.  Endo/Heme/Allergies: Negative.   Psychiatric/Behavioral: Negative.  Negative for depression and suicidal ideas. The patient is not nervous/anxious.  Objective:   BP 128/74   Pulse 92   Ht 5' 3 (1.6 m)   Wt 229 lb 12.8 oz (104.2 kg)   SpO2 98%   BMI 40.71 kg/m   Vitals:   02/14/24 1358  BP: 128/74  Pulse: 92  Height: 5' 3 (1.6 m)  Weight: 229 lb 12.8 oz (104.2 kg)  SpO2: 98%  BMI (Calculated): 40.72    Physical Exam Vitals and nursing note reviewed.  Constitutional:      Appearance: Normal appearance.  HENT:     Head: Normocephalic and atraumatic.     Nose: Nose normal.     Mouth/Throat:     Mouth: Mucous membranes are moist.     Pharynx: Oropharynx is clear.  Eyes:     Conjunctiva/sclera: Conjunctivae normal.     Pupils: Pupils are equal, round, and reactive to [place].  Cardiovascular:     Rate and Rhythm: Normal rate and regular rhythm.     Pulses: Normal pulses.     Heart sounds: Normal heart sounds. No murmur heard. Pulmonary:     Effort: Pulmonary effort is normal.     Breath sounds: Normal breath sounds. No wheezing.  Abdominal:     General: Bowel sounds are normal.     Palpations: Abdomen is soft.     Tenderness: There is no abdominal tenderness. There is no right CVA tenderness or left CVA tenderness.  Musculoskeletal:        General: Normal range of motion.     Cervical back: Normal range of motion.     Right lower leg: No edema.     Left lower leg: No edema.  Skin:     General: Skin is warm and dry.  Neurological:     General: No focal deficit present.     Mental Status: She is alert and oriented to person, place, and time.  Psychiatric:        Mood and Affect: Mood normal.        Behavior: Behavior normal.      No results found for any visits on 02/14/24.  Recent Results (from the past 2160 hours)  POCT CBG (Fasting - Glucose)     Status: Abnormal   Collection Time: 02/01/24  2:13 PM  Result Value Ref Range   Glucose Fasting, POC 285 (A) 70 - 99 mg/dL  TSH     Status: None   Collection Time: 02/02/24 11:13 AM  Result Value Ref Range   TSH 2.460 0.450 - 4.500 uIU/mL  Hemoglobin A1c     Status: Abnormal   Collection Time: 02/02/24 11:13 AM  Result Value Ref Range   Hgb A1c MFr Bld 6.9 (H) 4.8 - 5.6 %    Comment:          Prediabetes: 5.7 - 6.4          Diabetes: >6.4          Glycemic control for adults with diabetes: <7.0    Est. average glucose Bld gHb Est-mCnc 151 mg/dL  CBC with Diff     Status: Abnormal   Collection Time: 02/02/24 11:13 AM  Result Value Ref Range   WBC 10.8 3.4 - 10.8 x10E3/uL   RBC 4.32 3.77 - 5.28 x10E6/uL   Hemoglobin 12.8 11.1 - 15.9 g/dL   Hematocrit 59.2 65.9 - 46.6 %   MCV 94 79 - 97 fL   MCH 29.6 26.6 - 33.0 pg   MCHC 31.4 (L) 31.5 - 35.7 g/dL  RDW 12.9 11.7 - 15.4 %   Platelets 351 150 - 450 x10E3/uL   Neutrophils 63 Not Estab. %   Lymphs 25 Not Estab. %   Monocytes 7 Not Estab. %   Eos 4 Not Estab. %   Basos 1 Not Estab. %   Neutrophils Absolute 6.8 1.4 - 7.0 x10E3/uL   Lymphocytes Absolute 2.7 0.7 - 3.1 x10E3/uL   Monocytes Absolute 0.8 0.1 - 0.9 x10E3/uL   EOS (ABSOLUTE) 0.4 0.0 - 0.4 x10E3/uL   Basophils Absolute 0.1 0.0 - 0.2 x10E3/uL   Immature Granulocytes 0 Not Estab. %   Immature Grans (Abs) 0.0 0.0 - 0.1 x10E3/uL  Lipid Panel w/o Chol/HDL Ratio     Status: None   Collection Time: 02/02/24 11:13 AM  Result Value Ref Range   Cholesterol, Total 128 100 - 199 mg/dL   Triglycerides 94 0  - 149 mg/dL   HDL 49 >60 mg/dL   VLDL Cholesterol Cal 18 5 - 40 mg/dL   LDL Chol Calc (NIH) 61 0 - 99 mg/dL  [id number]     Status: Abnormal   Collection Time: 02/02/24 11:13 AM  Result Value Ref Range   Glucose 130 (H) 70 - 99 mg/dL   BUN 20 8 - 27 mg/dL   Creatinine, Ser 9.02 0.57 - 1.00 mg/dL   eGFR 61 >40 fO/fpw/8.26   BUN/Creatinine Ratio 21 12 - 28   Sodium 141 134 - 144 mmol/L   Potassium 4.6 3.5 - 5.2 mmol/L   Chloride 100 96 - 106 mmol/L   CO2 24 20 - 29 mmol/L   Calcium  10.0 8.7 - 10.3 mg/dL   Total Protein 6.7 6.0 - 8.5 g/dL   Albumin 4.5 3.8 - 4.8 g/dL   Globulin, Total 2.2 1.5 - 4.5 g/dL   Bilirubin Total 0.4 0.0 - 1.2 mg/dL   Alkaline Phosphatase 96 49 - 135 IU/L   AST 13 0 - 40 IU/L   ALT 12 0 - 32 IU/L      Assessment & Plan:  Start Mounjaro  2.5 mg weekly injection. Provided patient with education on proper administration and patient practiced proper administration without difficulty. Samples provided. Continue other medications as prescribed. Reinforced healthy diet and exercise as tolerated. Problem List Items Addressed This Visit       Cardiovascular and Mediastinum   Hypertension associated with diabetes (HCC) - Primary   Relevant Medications   tirzepatide  (MOUNJARO ) 5 MG/0.5ML Pen     Respiratory   OSA on CPAP     Endocrine   Type 2 diabetes mellitus with hyperglycemia, without long-term current use of insulin (HCC)   Relevant Medications   tirzepatide  (MOUNJARO ) 5 MG/0.5ML Pen   Other Relevant Orders   AMB Referral VBCI Care Management   Combined hyperlipidemia associated with type 2 diabetes mellitus (HCC)   Relevant Medications   tirzepatide  (MOUNJARO ) 5 MG/0.5ML Pen     Other   Morbid obesity with body mass index (BMI) of 40.0 to 44.9 in adult [facility])   Relevant Medications   tirzepatide  (MOUNJARO ) 5 MG/0.5ML Pen    Return in about 4 weeks (around 03/13/2024).   Total time spent: 25 minutes. This time includes review of previous notes  and results and patient face to face interaction during today's visit.    FERNAND FREDY RAMAN, MD  02/14/2024   This document may have been prepared by [facility] Voice Recognition software and as such may include unintentional dictation errors.     [1]  Allergies Allergen Reactions  Cisapride Hives   Sulfa Antibiotics Hives   Biaxin [Clarithromycin]   [2]  Outpatient Medications Prior to Visit  Medication Sig   ACCU-CHEK AVIVA PLUS test strip    acetaminophen (TYLENOL) 650 MG CR tablet Take 650 mg by mouth every 8 (eight) hours as needed for pain.   albuterol  (VENTOLIN  HFA) 108 (90 Base) MCG/ACT inhaler Inhale 2 puffs into the lungs every 6 (six) hours as needed for wheezing or shortness of breath.   Alcohol Swabs (DROPSAFE ALCOHOL PREP) 70 % PADS    amLODipine  (NORVASC ) 5 MG tablet Take 1 tablet (5 mg total) by mouth daily.   aspirin  EC 81 MG tablet Take 1 tablet (81 mg total) by mouth daily.   atorvastatin  (LIPITOR) 10 MG tablet Take 1 tablet (10 mg total) by mouth daily.   Biotin 5000 MCG TABS Take 1 tablet by mouth daily.   cetirizine (ZYRTEC) 10 MG tablet Take 10 mg by mouth daily.   dapagliflozin propanediol (FARXIGA) 10 MG TABS tablet Take by mouth daily.   famotidine  (PEPCID ) 20 MG tablet Take 1 tablet (20 mg total) by mouth at bedtime.   ferrous sulfate 325 (65 FE) MG tablet Take 325 mg by mouth daily with breakfast.   glipiZIDE  (GLUCOTROL  XL) 5 MG 24 hr tablet Take 1 tablet (5 mg total) by mouth daily with breakfast.   levothyroxine  (SYNTHROID ) 125 MCG tablet Take 1 tablet (125 mcg total) by mouth daily.   losartan -hydrochlorothiazide (HYZAAR) 100-25 MG tablet Take 1 tablet by mouth daily.   metFORMIN  (GLUCOPHAGE ) 1000 MG tablet Take 1 tablet (1,000 mg total) by mouth 2 (two) times daily with a meal.   montelukast  (SINGULAIR ) 10 MG tablet TAKE 1 TABLET EVERY DAY   Omega-3 Fatty Acids (FISH OIL CONCENTRATE) 300 MG CAPS Take by mouth.   pantoprazole  (PROTONIX ) 40 MG tablet Take 1  tablet (40 mg total) by mouth daily.   Turmeric Curcumin 500 MG CAPS Take by mouth.   No facility-administered medications prior to visit.

## 2024-02-14 NOTE — Progress Notes (Unsigned)
 [facility] [address] [place], [place] [address]  Internal MEDICINE  Office Visit Note  Patient Name: Danielle Delgado  [id number]  [id number]  Date of Service: 02/14/2024  Chief Complaint  Patient presents with   Follow-up    HPI Danielle Delgado presents for a follow-up visit for Asthma -- uses a rescue inhaler as needed. Reports using it once or twice over the past year.  OSA on CPAP -- doing well, sleep well, wakes up well-rested. No issues, does not need any supplies. No CPAP download available. Epworth SS score 1 out of 24.   EPWORTH SLEEPINESS SCALE: Scale: (0)= no chance of dozing; (1)= slight chance of dozing; (2)= moderate chance of dozing; (3)= high chance of dozing Chance  Situtation Sitting and reading: 0 Watching TV: 0 Sitting Inactive in public: 0 As a passenger in car: 1   Lying down to rest: 0 Sitting and talking: 0 Sitting quielty after lunch: 0 In a car, stopped in traffic: 0 TOTAL SCORE:   1 out of 24   Current Medication: Outpatient Encounter Medications as of 02/14/2024  Medication Sig Note   ACCU-CHEK AVIVA PLUS test strip  01/17/2014: Received from: External Pharmacy   acetaminophen (TYLENOL) 650 MG CR tablet Take 650 mg by mouth every 8 (eight) hours as needed for pain.    albuterol  (VENTOLIN  HFA) 108 (90 Base) MCG/ACT inhaler Inhale 2 puffs into the lungs every 6 (six) hours as needed for wheezing or shortness of breath.    Alcohol Swabs (DROPSAFE ALCOHOL PREP) 70 % PADS     amLODipine  (NORVASC ) 5 MG tablet Take 1 tablet (5 mg total) by mouth daily.    aspirin  EC 81 MG tablet Take 1 tablet (81 mg total) by mouth daily.    atorvastatin  (LIPITOR) 10 MG tablet Take 1 tablet (10 mg total) by mouth daily.    Biotin 5000 MCG TABS Take 1 tablet by mouth daily.    cetirizine (ZYRTEC) 10 MG tablet Take 10 mg by mouth daily.    dapagliflozin propanediol (FARXIGA) 10 MG TABS tablet Take by mouth daily.    famotidine  (PEPCID ) 20 MG tablet Take 1 tablet  (20 mg total) by mouth at bedtime.    ferrous sulfate 325 (65 FE) MG tablet Take 325 mg by mouth daily with breakfast.    glipiZIDE  (GLUCOTROL  XL) 5 MG 24 hr tablet Take 1 tablet (5 mg total) by mouth daily with breakfast.    levothyroxine  (SYNTHROID ) 125 MCG tablet Take 1 tablet (125 mcg total) by mouth daily.    losartan -hydrochlorothiazide (HYZAAR) 100-25 MG tablet Take 1 tablet by mouth daily.    metFORMIN  (GLUCOPHAGE ) 1000 MG tablet Take 1 tablet (1,000 mg total) by mouth 2 (two) times daily with a meal.    montelukast  (SINGULAIR ) 10 MG tablet TAKE 1 TABLET EVERY DAY    Omega-3 Fatty Acids (FISH OIL CONCENTRATE) 300 MG CAPS Take by mouth. 11/06/2014: Received from: [facility]   pantoprazole  (PROTONIX ) 40 MG tablet Take 1 tablet (40 mg total) by mouth daily.    tirzepatide  (MOUNJARO ) 5 MG/0.5ML Pen Inject 5 mg into the skin once a week.    Turmeric Curcumin 500 MG CAPS Take by mouth.    No facility-administered encounter medications on file as of 02/14/2024.    Surgical History: Past Surgical History:  Procedure Laterality Date   BIOPSY  09/14/2022   Procedure: BIOPSY;  Surgeon: Maryruth Ole DASEN, MD;  Location: [facility] ENDOSCOPY;  Service: Endoscopy;;  BREAST EXCISIONAL BIOPSY Right 1996   neg   BREAST SURGERY     biopsy   CHOLECYSTECTOMY     COLONOSCOPY WITH PROPOFOL  N/A 10/12/2014   Procedure: COLONOSCOPY WITH PROPOFOL ;  Surgeon: Lamar ONEIDA Holmes, MD;  Location: [facility] ENDOSCOPY;  Service: Endoscopy;  Laterality: N/A;   COLONOSCOPY WITH PROPOFOL  N/A 09/14/2022   Procedure: COLONOSCOPY WITH PROPOFOL ;  Surgeon: Maryruth Ole ONEIDA, MD;  Location: [facility] ENDOSCOPY;  Service: Endoscopy;  Laterality: N/A;   DILATION AND CURETTAGE OF UTERUS     ESOPHAGOGASTRODUODENOSCOPY N/A 09/14/2022   Procedure: ESOPHAGOGASTRODUODENOSCOPY (EGD);  Surgeon: Maryruth Ole ONEIDA, MD;  Location: [facility] ENDOSCOPY;  Service: Endoscopy;  Laterality: N/A;   ESOPHAGOGASTRODUODENOSCOPY (EGD) WITH  PROPOFOL  N/A 10/12/2014   Procedure: ESOPHAGOGASTRODUODENOSCOPY (EGD) WITH PROPOFOL ;  Surgeon: Lamar ONEIDA Holmes, MD;  Location: [facility] ENDOSCOPY;  Service: Endoscopy;  Laterality: N/A;   NASAL SINUS SURGERY     VAGINAL HYSTERECTOMY  1982    Medical History: Past Medical History:  Diagnosis Date   Allergy    Asthma    Chronic vaginitis    Diabetes mellitus without complication (HCC)    Dyspareunia in female    Eczema    FH: breast cancer    FH: colon cancer    GERD (gastroesophageal reflux disease)    Hiatus hernia syndrome    Hyperlipidemia    Hypertension    Hypothyroidism    Morbid obesity (HCC)    Postmenopausal    Sleep apnea    Superficial varicosities    Vaginal atrophy     Family History: Family History  Problem Relation Age of Onset   Osteoporosis Mother    Breast cancer Maternal Aunt 60   Diabetes Maternal Grandmother    Diabetes Maternal Grandfather    Heart disease Maternal Grandfather    Colon cancer Cousin    Breast cancer Cousin 40       mat cousin   Cancer Father    Ovarian cancer Neg Hx     Social History   Socioeconomic History   Marital status: Married    Spouse name: Not on file   Number of children: Not on file   Years of education: Not on file   Highest education level: Not on file  Occupational History   Not on file  Tobacco Use   Smoking status: Never   Smokeless tobacco: Never  Vaping Use   Vaping status: Never Used  Substance and Sexual Activity   Alcohol use: No    Alcohol/week: 0.0 standard drinks of alcohol   Drug use: No   Sexual activity: Not Currently    Birth control/protection: Surgical    Comment: hysterectomy  Other Topics Concern   Not on file  Social History Narrative   Not on file   Social Drivers of Health   Tobacco Use: Low Risk (02/14/2024)   Patient History    Smoking Tobacco Use: Never    Smokeless Tobacco Use: Never    Passive Exposure: Not on file  Financial Resource Strain: Low Risk  (08/23/2023)    Received from [facility]   Overall Financial Resource Strain (CARDIA)    Difficulty of Paying Living Expenses: Not hard at all  Food Insecurity: No Food Insecurity (08/23/2023)   Received from [facility]   Epic    Within the past 12 months, you worried that your food would run out before you got the money to buy more.: Never true    Within the past  12 months, the food you bought just didn't last and you didn't have money to get more.: Never true  Transportation Needs: No Transportation Needs (08/23/2023)   Received from [facility] Transportation    In the past 12 months, has lack of transportation kept you from medical appointments or from getting medications?: No    Lack of Transportation (Non-Medical): No  Physical Activity: Not on file  Stress: Not on file  Social Connections: Not on file  Intimate Partner Violence: Not on file  Depression (PHQ2-9): Low Risk (11/02/2023)   Depression (PHQ2-9)    PHQ-2 Score: 3  Alcohol Screen: Not on file  Housing: Low Risk  (08/23/2023)   Received from [facility]   Epic    In the last 12 months, was there a time when you were not able to pay the mortgage or rent on time?: No    In the past 12 months, how many times have you moved where you were living?: 0    At any time in the past 12 months, were you homeless or living in a shelter (including now)?: No  Utilities: Not At Risk (08/23/2023)   Received from [facility]   Epic    In the past 12 months has the electric, gas, oil, or water company threatened to shut off services in your home?: No  Health Literacy: Not on file      Review of Systems  Vital Signs: BP 110/60 (BP Location: Left Arm, Patient Position: Sitting, Cuff Size: Normal)   Pulse 81   Temp (!) 95.9 F (35.5 C)   Resp 16   Ht 5' 3 (1.6 m)   Wt 227 lb 12.8 oz (103.3 kg)   SpO2 98%   BMI 40.35 kg/m    Physical  Exam     Assessment/Plan:   General Counseling: Kelty verbalizes understanding of the findings of todays visit and agrees with plan of treatment. I have discussed any further diagnostic evaluation that may be needed or ordered today. We also reviewed her medications today. she has been encouraged to call the office with any questions or concerns that should arise related to todays visit.    Orders Placed This Encounter  Procedures   Pulmonary function test    No orders of the defined types were placed in this encounter.   Return for F/U, PFT @ [facility] and need to f/u for results with Karine Garn or DSK. .   Total time spent:*** Minutes Time spent includes review of chart, medications, test results, and follow up plan with the patient.   [place] Controlled Substance Database was reviewed by me.  This patient was seen by Mardy Maxin, FNP-C in collaboration with Dr. Sigrid Bathe as a part of collaborative care agreement.   Macy Lingenfelter R. Maxin, MSN, FNP-C Internal medicine

## 2024-02-15 ENCOUNTER — Telehealth: Payer: Self-pay

## 2024-02-15 ENCOUNTER — Encounter: Payer: Self-pay | Admitting: Nurse Practitioner

## 2024-02-15 DIAGNOSIS — E1165 Type 2 diabetes mellitus with hyperglycemia: Secondary | ICD-10-CM

## 2024-02-15 NOTE — Progress Notes (Signed)
° °  02/15/2024  Patient ID: Danielle Delgado, female   DOB: 06/02/1950, 73 y.o.   MRN: [id number]  Received referral from PCP to reach out to patient to discuss affordability of Mounjaro .  Performed an rx benefit for check for 2026 for Mounjaro .  Shows patient will have a $250 deductible to meet for 2026 and the copay each month for Mounjaro  is $47. January: $297 Feb: $47 per month  Insurance check projects that patient's cost should drop down to $0 by August.  Reviewed with patient, she expressed understanding and reports this should be affordable. Plans to start Mounjaro  2.5mg  samples this week. Has f/u with PCP in 4 weeks to assess  Jon VEAR Lindau, PharmD Clinical Pharmacist [phone]

## 2024-02-16 ENCOUNTER — Ambulatory Visit: Admitting: Podiatry

## 2024-02-16 ENCOUNTER — Other Ambulatory Visit

## 2024-02-16 DIAGNOSIS — B351 Tinea unguium: Secondary | ICD-10-CM

## 2024-02-16 DIAGNOSIS — Z13820 Encounter for screening for osteoporosis: Secondary | ICD-10-CM | POA: Diagnosis not present

## 2024-02-16 DIAGNOSIS — M8589 Other specified disorders of bone density and structure, multiple sites: Secondary | ICD-10-CM | POA: Diagnosis not present

## 2024-02-16 DIAGNOSIS — M79676 Pain in unspecified toe(s): Secondary | ICD-10-CM

## 2024-02-16 DIAGNOSIS — E119 Type 2 diabetes mellitus without complications: Secondary | ICD-10-CM

## 2024-02-16 NOTE — Progress Notes (Signed)
 She presents today for follow-up of her diabetic foot.  She states that she really has no complaints she is here for the exam and maybe have her nails cut.  Objective: Vital signs are stable alert oriented x 3.  There is no erythema edema cellulitis drainage or odor her nails are slightly elongated they recently trimmed.  Marginally tender.  No change in neurologic sensorium which is intact per [facility] monofilament.  Deep tendon reflexes are intact muscle strength is normal and symmetrical.  Orthopedic evaluation demonstrates all joints distal to the ankle full range of motion without crepitation.  Cutaneous evaluation does demonstrate slightly elongated nails.  Assessment: Diabetes mellitus without complications bilateral lower extremity.  Pain limb secondary to dystrophic nails.  Plan: Debridement of dystrophic nails follow-up with her in 6 months

## 2024-02-17 NOTE — Progress Notes (Signed)
 Patient notified

## 2024-02-22 ENCOUNTER — Ambulatory Visit: Admitting: Internal Medicine

## 2024-03-08 ENCOUNTER — Ambulatory Visit: Admitting: Internal Medicine

## 2024-03-08 DIAGNOSIS — J4520 Mild intermittent asthma, uncomplicated: Secondary | ICD-10-CM

## 2024-03-13 ENCOUNTER — Encounter: Payer: Self-pay | Admitting: Internal Medicine

## 2024-03-13 ENCOUNTER — Ambulatory Visit: Admitting: Internal Medicine

## 2024-03-13 VITALS — BP 136/74 | HR 85 | Ht 63.0 in | Wt 222.0 lb

## 2024-03-13 DIAGNOSIS — Z6839 Body mass index (BMI) 39.0-39.9, adult: Secondary | ICD-10-CM

## 2024-03-13 DIAGNOSIS — E782 Mixed hyperlipidemia: Secondary | ICD-10-CM | POA: Diagnosis not present

## 2024-03-13 DIAGNOSIS — I152 Hypertension secondary to endocrine disorders: Secondary | ICD-10-CM

## 2024-03-13 DIAGNOSIS — E1165 Type 2 diabetes mellitus with hyperglycemia: Secondary | ICD-10-CM

## 2024-03-13 DIAGNOSIS — K219 Gastro-esophageal reflux disease without esophagitis: Secondary | ICD-10-CM

## 2024-03-13 DIAGNOSIS — G4733 Obstructive sleep apnea (adult) (pediatric): Secondary | ICD-10-CM | POA: Diagnosis not present

## 2024-03-13 DIAGNOSIS — E1159 Type 2 diabetes mellitus with other circulatory complications: Secondary | ICD-10-CM | POA: Diagnosis not present

## 2024-03-13 DIAGNOSIS — E66812 Obesity, class 2: Secondary | ICD-10-CM | POA: Insufficient documentation

## 2024-03-13 DIAGNOSIS — R110 Nausea: Secondary | ICD-10-CM | POA: Insufficient documentation

## 2024-03-13 DIAGNOSIS — E1169 Type 2 diabetes mellitus with other specified complication: Secondary | ICD-10-CM | POA: Diagnosis not present

## 2024-03-13 LAB — POCT CBG (FASTING - GLUCOSE)-MANUAL ENTRY: Glucose Fasting, POC: 128 mg/dL — AB (ref 70–99)

## 2024-03-13 MED ORDER — TIRZEPATIDE 2.5 MG/0.5ML ~~LOC~~ SOAJ: 2.5000 mg | SUBCUTANEOUS | 2 refills | Status: AC

## 2024-03-13 MED ORDER — ONDANSETRON HCL 4 MG PO TABS: 4.0000 mg | ORAL_TABLET | Freq: Three times a day (TID) | ORAL | 0 refills | Status: AC | PRN

## 2024-03-13 NOTE — Progress Notes (Signed)
 "  Established Patient Office Visit  Subjective:  Patient ID: Danielle Delgado, female    DOB: [birth year]  Age: 74 y.o. MRN: [id number]  Chief Complaint  Patient presents with   Follow-up    4 week follow up    Patient is here today for follow up. She reports feeling well today and is happy that she has lost 5 lbs since her last appointment.  She reports she started Mounjaro  2.5 mg weekly injection since her last appointment. She endorses occasional bloating and reports she has occasional nausea shortly after her injection. Will send in Zofran  as needed. Continue taking GERD medications as prescribed. Reinforced healthy diet and smaller portion sizes. She would like to continue the Mounjaro  2.5 mg dose at this time. She has not been checking her blood sugars lately and denies any symptoms of hypoglycemia. Will send in refill for Mounjaro  2.5 mg weekly injection.  She is due for knee injections later today for osteoarthritis. She has been able to loose 5 lbs since her last visit. She reports sometimes at night her knee pain keeps her from sleeping well but it does not happen regularly. Patient is aware osteoarthritis is bone on bone and she was recommended to have knee replacement surgery. She ambulates with a cane but states overall her pain is stable at this time and improves short term after she gets her joint injections.    No other concerns at this time.   Past Medical History:  Diagnosis Date   Allergy    Asthma    Chronic vaginitis    Diabetes mellitus without complication (HCC)    Dyspareunia in female    Eczema    FH: breast cancer    FH: colon cancer    GERD (gastroesophageal reflux disease)    Hiatus hernia syndrome    Hyperlipidemia    Hypertension    Hypothyroidism    Morbid obesity (HCC)    Postmenopausal    Sleep apnea    Superficial varicosities    Vaginal atrophy     Past Surgical History:  Procedure Laterality Date   BIOPSY  09/14/2022   Procedure: BIOPSY;   Surgeon: Maryruth Ole DASEN, MD;  Location: [facility] ENDOSCOPY;  Service: Endoscopy;;   BREAST EXCISIONAL BIOPSY Right 1996   neg   BREAST SURGERY     biopsy   CHOLECYSTECTOMY     COLONOSCOPY WITH PROPOFOL  N/A 10/12/2014   Procedure: COLONOSCOPY WITH PROPOFOL ;  Surgeon: Lamar DASEN Holmes, MD;  Location: [facility] ENDOSCOPY;  Service: Endoscopy;  Laterality: N/A;   COLONOSCOPY WITH PROPOFOL  N/A 09/14/2022   Procedure: COLONOSCOPY WITH PROPOFOL ;  Surgeon: Maryruth Ole DASEN, MD;  Location: [facility] ENDOSCOPY;  Service: Endoscopy;  Laterality: N/A;   DILATION AND CURETTAGE OF UTERUS     ESOPHAGOGASTRODUODENOSCOPY N/A 09/14/2022   Procedure: ESOPHAGOGASTRODUODENOSCOPY (EGD);  Surgeon: Maryruth Ole DASEN, MD;  Location: [facility] ENDOSCOPY;  Service: Endoscopy;  Laterality: N/A;   ESOPHAGOGASTRODUODENOSCOPY (EGD) WITH PROPOFOL  N/A 10/12/2014   Procedure: ESOPHAGOGASTRODUODENOSCOPY (EGD) WITH PROPOFOL ;  Surgeon: Lamar DASEN Holmes, MD;  Location: [facility] ENDOSCOPY;  Service: Endoscopy;  Laterality: N/A;   NASAL SINUS SURGERY     VAGINAL HYSTERECTOMY  1982    Social History   Socioeconomic History   Marital status: Married    Spouse name: Not on file   Number of children: Not on file   Years of education: Not on file   Highest education level: Not on file  Occupational History   Not on file  Tobacco  Use   Smoking status: Never   Smokeless tobacco: Never  Vaping Use   Vaping status: Never Used  Substance and Sexual Activity   Alcohol use: No    Alcohol/week: 0.0 standard drinks of alcohol   Drug use: No   Sexual activity: Not Currently    Birth control/protection: Surgical    Comment: hysterectomy  Other Topics Concern   Not on file  Social History Narrative   Not on file   Social Drivers of Health   Tobacco Use: Low Risk (03/13/2024)   Patient History    Smoking Tobacco Use: Never    Smokeless Tobacco Use: Never    Passive Exposure: Not on file  Financial Resource Strain: Low Risk  (08/23/2023)    Received from [facility]   Overall Financial Resource Strain (CARDIA)    Difficulty of Paying Living Expenses: Not hard at all  Food Insecurity: No Food Insecurity (08/23/2023)   Received from [facility]   Epic    Within the past 12 months, you worried that your food would run out before you got the money to buy more.: Never true    Within the past 12 months, the food you bought just didn't last and you didn't have money to get more.: Never true  Transportation Needs: No Transportation Needs (08/23/2023)   Received from [facility] - Transportation    In the past 12 months, has lack of transportation kept you from medical appointments or from getting medications?: No    Lack of Transportation (Non-Medical): No  Physical Activity: Not on file  Stress: Not on file  Social Connections: Not on file  Intimate Partner Violence: Not on file  Depression (PHQ2-9): Low Risk (11/02/2023)   Depression (PHQ2-9)    PHQ-2 Score: 3  Alcohol Screen: Not on file  Housing: Low Risk  (08/23/2023)   Received from [facility]   Epic    In the last 12 months, was there a time when you were not able to pay the mortgage or rent on time?: No    In the past 12 months, how many times have you moved where you were living?: 0    At any time in the past 12 months, were you homeless or living in a shelter (including now)?: No  Utilities: Not At Risk (08/23/2023)   Received from [facility]   Epic    In the past 12 months has the electric, gas, oil, or water company threatened to shut off services in your home?: No  Health Literacy: Not on file    Family History  Problem Relation Age of Onset   Osteoporosis Mother    Breast cancer Maternal Aunt 60   Diabetes Maternal Grandmother    Diabetes Maternal Grandfather    Heart disease Maternal Grandfather    Colon cancer Cousin    Breast cancer Cousin 40       mat  cousin   Cancer Father    Ovarian cancer Neg Hx     Allergies[1]  Show/hide medication list[2]  Review of Systems  Constitutional: Negative.  Negative for chills, fever and malaise/fatigue.  HENT: Negative.  Negative for congestion and sore throat.   Eyes: Negative.  Negative for blurred vision and pain.  Respiratory: Negative.  Negative for cough and shortness of breath.   Cardiovascular: Negative.  Negative for chest pain, palpitations and leg swelling.  Gastrointestinal: Negative.  Negative for abdominal  pain, blood in stool, constipation, diarrhea, heartburn, melena, nausea and vomiting.  Genitourinary: Negative.  Negative for dysuria, flank pain, frequency and urgency.  Musculoskeletal:  Positive for joint pain (chronic osteoarthritis in knees). Negative for myalgias.  Skin: Negative.   Neurological: Negative.  Negative for dizziness, tingling, sensory change, weakness and headaches.  Endo/Heme/Allergies: Negative.   Psychiatric/Behavioral: Negative.  Negative for depression and suicidal ideas. The patient is not nervous/anxious.        Objective:   BP 136/74   Pulse 85   Ht 5' 3 (1.6 m)   Wt 222 lb (100.7 kg)   SpO2 98%   BMI 39.33 kg/m   Vitals:   03/13/24 1323  BP: 136/74  Pulse: 85  Height: 5' 3 (1.6 m)  Weight: 222 lb (100.7 kg)  SpO2: 98%  BMI (Calculated): 39.34    Physical Exam Vitals and nursing note reviewed.  Constitutional:      Appearance: Normal appearance.  HENT:     Head: Normocephalic and atraumatic.     Nose: Nose normal.     Mouth/Throat:     Mouth: Mucous membranes are moist.     Pharynx: Oropharynx is clear.  Eyes:     Conjunctiva/sclera: Conjunctivae normal.     Pupils: Pupils are equal, round, and reactive to [place].  Cardiovascular:     Rate and Rhythm: Normal rate and regular rhythm.     Pulses: Normal pulses.     Heart sounds: Normal heart sounds. No murmur heard. Pulmonary:     Effort: Pulmonary effort is normal.      Breath sounds: Normal breath sounds. No wheezing.  Abdominal:     General: Bowel sounds are normal.     Palpations: Abdomen is soft.     Tenderness: There is no abdominal tenderness. There is no right CVA tenderness or left CVA tenderness.  Musculoskeletal:        General: Normal range of motion.     Cervical back: Normal range of motion.     Right lower leg: No edema.     Left lower leg: No edema.  Skin:    General: Skin is warm and dry.  Neurological:     General: No focal deficit present.     Mental Status: She is alert and oriented to person, place, and time.  Psychiatric:        Mood and Affect: Mood normal.        Behavior: Behavior normal.      Results for orders placed or performed in visit on 03/13/24  POCT CBG (Fasting - Glucose)  Result Value Ref Range   Glucose Fasting, POC 128 (A) 70 - 99 mg/dL    Recent Results (from the past 2160 hours)  POCT CBG (Fasting - Glucose)     Status: Abnormal   Collection Time: 02/01/24  2:13 PM  Result Value Ref Range   Glucose Fasting, POC 285 (A) 70 - 99 mg/dL  TSH     Status: None   Collection Time: 02/02/24 11:13 AM  Result Value Ref Range   TSH 2.460 0.450 - 4.500 uIU/mL  Hemoglobin A1c     Status: Abnormal   Collection Time: 02/02/24 11:13 AM  Result Value Ref Range   Hgb A1c MFr Bld 6.9 (H) 4.8 - 5.6 %    Comment:          Prediabetes: 5.7 - 6.4          Diabetes: >6.4  Glycemic control for adults with diabetes: <7.0    Est. average glucose Bld gHb Est-mCnc 151 mg/dL  CBC with Diff     Status: Abnormal   Collection Time: 02/02/24 11:13 AM  Result Value Ref Range   WBC 10.8 3.4 - 10.8 x10E3/uL   RBC 4.32 3.77 - 5.28 x10E6/uL   Hemoglobin 12.8 11.1 - 15.9 g/dL   Hematocrit 59.2 65.9 - 46.6 %   MCV 94 79 - 97 fL   MCH 29.6 26.6 - 33.0 pg   MCHC 31.4 (L) 31.5 - 35.7 g/dL   RDW 87.0 88.2 - 84.5 %   Platelets 351 150 - 450 x10E3/uL   Neutrophils 63 Not Estab. %   Lymphs 25 Not Estab. %   Monocytes 7 Not  Estab. %   Eos 4 Not Estab. %   Basos 1 Not Estab. %   Neutrophils Absolute 6.8 1.4 - 7.0 x10E3/uL   Lymphocytes Absolute 2.7 0.7 - 3.1 x10E3/uL   Monocytes Absolute 0.8 0.1 - 0.9 x10E3/uL   EOS (ABSOLUTE) 0.4 0.0 - 0.4 x10E3/uL   Basophils Absolute 0.1 0.0 - 0.2 x10E3/uL   Immature Granulocytes 0 Not Estab. %   Immature Grans (Abs) 0.0 0.0 - 0.1 x10E3/uL  Lipid Panel w/o Chol/HDL Ratio     Status: None   Collection Time: 02/02/24 11:13 AM  Result Value Ref Range   Cholesterol, Total 128 100 - 199 mg/dL   Triglycerides 94 0 - 149 mg/dL   HDL 49 >60 mg/dL   VLDL Cholesterol Cal 18 5 - 40 mg/dL   LDL Chol Calc (NIH) 61 0 - 99 mg/dL  [id number]     Status: Abnormal   Collection Time: 02/02/24 11:13 AM  Result Value Ref Range   Glucose 130 (H) 70 - 99 mg/dL   BUN 20 8 - 27 mg/dL   Creatinine, Ser 9.02 0.57 - 1.00 mg/dL   eGFR 61 >40 fO/fpw/8.26   BUN/Creatinine Ratio 21 12 - 28   Sodium 141 134 - 144 mmol/L   Potassium 4.6 3.5 - 5.2 mmol/L   Chloride 100 96 - 106 mmol/L   CO2 24 20 - 29 mmol/L   Calcium  10.0 8.7 - 10.3 mg/dL   Total Protein 6.7 6.0 - 8.5 g/dL   Albumin 4.5 3.8 - 4.8 g/dL   Globulin, Total 2.2 1.5 - 4.5 g/dL   Bilirubin Total 0.4 0.0 - 1.2 mg/dL   Alkaline Phosphatase 96 49 - 135 IU/L   AST 13 0 - 40 IU/L   ALT 12 0 - 32 IU/L  POCT CBG (Fasting - Glucose)     Status: Abnormal   Collection Time: 03/13/24  1:32 PM  Result Value Ref Range   Glucose Fasting, POC 128 (A) 70 - 99 mg/dL      Assessment & Plan:  Continue medications as prescribed. Reinforced healthy diet and exercise as tolerated. Start Zofran  as needed for post injection nausea. Continue wearing CPAP at bedtime.  Problem List Items Addressed This Visit       Cardiovascular and Mediastinum   Hypertension associated with diabetes (HCC)   Relevant Medications   tirzepatide  (MOUNJARO ) 2.5 MG/0.5ML Pen     Respiratory   OSA on CPAP     Digestive   Gastroesophageal reflux disease without  esophagitis   Relevant Medications   ondansetron  (ZOFRAN ) 4 MG tablet     Endocrine   Type 2 diabetes mellitus with hyperglycemia, without long-term current use of insulin (HCC)   Relevant  Medications   tirzepatide  (MOUNJARO ) 2.5 MG/0.5ML Pen   Other Relevant Orders   POCT CBG (Fasting - Glucose) (Completed)   Combined hyperlipidemia associated with type 2 diabetes mellitus (HCC) - Primary   Relevant Medications   tirzepatide  (MOUNJARO ) 2.5 MG/0.5ML Pen     Other   Nausea   Relevant Medications   ondansetron  (ZOFRAN ) 4 MG tablet   Class 2 severe obesity due to excess calories with serious comorbidity and body mass index (BMI) of 39.0 to 39.9 in adult   Relevant Medications   tirzepatide  (MOUNJARO ) 2.5 MG/0.5ML Pen    Return in about 7 weeks (around 05/02/2024).   Total time spent: 30 minutes. This time includes review of previous notes and results and patient face to face interaction during today's visit.    FERNAND FREDY RAMAN, MD  03/13/2024   This document may have been prepared by [facility] Voice Recognition software and as such may include unintentional dictation errors.     [1]  Allergies Allergen Reactions   Cisapride Hives   Sulfa Antibiotics Hives   Biaxin [Clarithromycin]   [2]  Outpatient Medications Prior to Visit  Medication Sig   ACCU-CHEK AVIVA PLUS test strip    acetaminophen (TYLENOL) 650 MG CR tablet Take 650 mg by mouth every 8 (eight) hours as needed for pain.   albuterol  (VENTOLIN  HFA) 108 (90 Base) MCG/ACT inhaler Inhale 2 puffs into the lungs every 6 (six) hours as needed for wheezing or shortness of breath.   Alcohol Swabs (DROPSAFE ALCOHOL PREP) 70 % PADS    amLODipine  (NORVASC ) 5 MG tablet Take 1 tablet (5 mg total) by mouth daily.   aspirin  EC 81 MG tablet Take 1 tablet (81 mg total) by mouth daily.   atorvastatin  (LIPITOR) 10 MG tablet Take 1 tablet (10 mg total) by mouth daily.   Biotin 5000 MCG TABS Take 1 tablet by mouth daily.   cetirizine  (ZYRTEC) 10 MG tablet Take 10 mg by mouth daily.   dapagliflozin propanediol (FARXIGA) 10 MG TABS tablet Take by mouth daily.   famotidine  (PEPCID ) 20 MG tablet Take 1 tablet (20 mg total) by mouth at bedtime.   ferrous sulfate 325 (65 FE) MG tablet Take 325 mg by mouth daily with breakfast.   glipiZIDE  (GLUCOTROL  XL) 5 MG 24 hr tablet Take 1 tablet (5 mg total) by mouth daily with breakfast.   levothyroxine  (SYNTHROID ) 125 MCG tablet Take 1 tablet (125 mcg total) by mouth daily.   losartan -hydrochlorothiazide (HYZAAR) 100-25 MG tablet Take 1 tablet by mouth daily.   metFORMIN  (GLUCOPHAGE ) 1000 MG tablet Take 1 tablet (1,000 mg total) by mouth 2 (two) times daily with a meal.   montelukast  (SINGULAIR ) 10 MG tablet TAKE 1 TABLET EVERY DAY   Omega-3 Fatty Acids (FISH OIL CONCENTRATE) 300 MG CAPS Take by mouth.   pantoprazole  (PROTONIX ) 40 MG tablet Take 1 tablet (40 mg total) by mouth daily.   Turmeric Curcumin 500 MG CAPS Take by mouth.   [DISCONTINUED] tirzepatide  (MOUNJARO ) 5 MG/0.5ML Pen Inject 5 mg into the skin once a week.   No facility-administered medications prior to visit.   "

## 2024-03-16 NOTE — Procedures (Signed)
 [facility] [address] [place], [address]    Complete Pulmonary Function Testing Interpretation:  FINDINGS:  Forced vital capacity is normal.  FEV1 is normal.  FEV1 FVC ratio was mildly decreased.  Total lung capacity is normal.  Residual volume is normal.  FRC is normal.  DLCO was increased.  Postbronchodilator no significant change in FEV1.  IMPRESSION:  This pulmonary function study is within normal limits clinical correlation recommended  Danielle DELENA Bathe, MD [facility] Critical [facility] Medicine Sleep Medicine

## 2024-03-20 LAB — PULMONARY FUNCTION TEST

## 2024-03-21 ENCOUNTER — Encounter: Payer: Self-pay | Admitting: Internal Medicine

## 2024-03-21 ENCOUNTER — Ambulatory Visit: Admitting: Internal Medicine

## 2024-03-21 VITALS — BP 135/96 | HR 89 | Temp 98.0°F | Resp 16 | Ht 63.0 in | Wt 220.6 lb

## 2024-03-21 DIAGNOSIS — G4733 Obstructive sleep apnea (adult) (pediatric): Secondary | ICD-10-CM

## 2024-03-21 DIAGNOSIS — J4520 Mild intermittent asthma, uncomplicated: Secondary | ICD-10-CM

## 2024-03-21 NOTE — Patient Instructions (Signed)
 Asthma, Adult  Asthma is a condition that causes swelling and narrowing of the airways. These are the passages that lead from the nose and mouth down into the lungs. When asthma symptoms get worse it is called an asthma attack or flare. This can make it hard to breathe. Asthma flares can range from minor to life-threatening. There is no cure for asthma, but medicines and lifestyle changes can help to control it. What are the causes? It is not known exactly what causes asthma, but certain things can cause asthma symptoms to get worse (triggers). What can trigger an asthma attack? Cigarette smoke. Mold. Dust. Your pet's skin flakes (dander). Cockroaches. Pollen. Air pollution (like household cleaners, wood smoke, smog, or Therapist, occupational). What are the signs or symptoms? Trouble breathing (shortness of breath). Coughing. Making high-pitched whistling sounds when you breathe, most often when you breathe out (wheezing). Chest tightness. Tiredness with little activity. Poor exercise tolerance. How is this treated? Controller medicines that help prevent asthma symptoms. Fast-acting reliever or rescue medicines. These give short-term relief of asthma symptoms. Allergy medicines if your attacks are brought on by allergens. Medicines to help control the body's defense (immune) system. Staying away from the things that cause asthma attacks. Follow these instructions at home: Avoiding triggers in your home Do not allow anyone to smoke in your home. Limit use of fireplaces and wood stoves. Get rid of pests (such as roaches and mice) and their droppings. Keep your home clean. Clean your floors. Dust regularly. Use cleaning products that do not smell. Wash bed sheets and blankets every week in hot water. Dry them in a dryer. Have someone vacuum when you are not home. Change your heating and air conditioning filters often. Use blankets that are made of polyester or cotton. General  instructions Take over-the-counter and prescription medicines only as told by your doctor. Do not smoke or use any products that contain nicotine or tobacco. If you need help quitting, ask your doctor. Stay away from secondhand smoke. Avoid doing things outdoors when allergen counts are high and when air quality is low. Warm up before you exercise. Take time to cool down after exercise. Use a peak flow meter as told by your doctor. A peak flow meter is a tool that measures how well your lungs are working. Keep track of the peak flow meter's readings. Write them down. Follow your asthma action plan. This is a written plan for taking care of your asthma and treating your attacks. Make sure you get all the shots (vaccines) that your doctor recommends. Ask your doctor about a flu shot and a pneumonia shot. Keep all follow-up visits. Contact a doctor if: You have wheezing, shortness of breath, or a cough even while taking medicine to prevent attacks. The mucus you cough up (sputum) is thicker than usual. The mucus you cough up changes from clear or white to yellow, green, gray, or is bloody. You have problems from the medicine you are taking, such as: A rash. Itching. Swelling. Trouble breathing. You need reliever medicines more than 2-3 times a week. Your peak flow reading is still at 50-79% of your personal best after following the action plan for 1 hour. You have a fever. Get help right away if: You seem to be worse and are not responding to medicine during an asthma attack. You are short of breath even at rest. You get short of breath when doing very little activity. You have trouble eating, drinking, or talking. You have chest  pain or tightness. You have a fast heartbeat. Your lips or fingernails start to turn blue. You are light-headed or dizzy, or you faint. Your peak flow is less than 50% of your personal best. You feel too tired to breathe normally. These symptoms may be an  emergency. Get help right away. Call 911. Do not wait to see if the symptoms will go away. Do not drive yourself to the hospital. Summary Asthma is a long-term (chronic) condition in which the airways get tight and narrow. An asthma attack can make it hard to breathe. Asthma cannot be cured, but medicines and lifestyle changes can help control it. Make sure you understand how to avoid triggers and how and when to use your medicines. Avoid things that can cause allergy symptoms (allergens). These include animal skin flakes (dander) and pollen from trees or grass. Avoid things that pollute the air. These may include household cleaners, wood smoke, smog, or chemical odors. This information is not intended to replace advice given to you by your health care provider. Make sure you discuss any questions you have with your health care provider. Document Revised: 11/25/2020 Document Reviewed: 11/25/2020 Elsevier Patient Education  2024 [employer].

## 2024-03-21 NOTE — Progress Notes (Unsigned)
 [facility] [address] [place], [place] [address]  Pulmonary Sleep Medicine   Office Visit Note  Patient Name: Danielle Delgado DOB: [birth year] MRN [id number]  Date of Service: 03/21/2024  Complaints/HPI: She is doing well. She states she has not had to use her inhalers. Her PFT looks excellent with her FEV1 at 91% predicted  Office Spirometry Results:     ROS  General: (-) fever, (-) chills, (-) night sweats, (-) weakness Skin: (-) rashes, (-) itching,. Eyes: (-) visual changes, (-) redness, (-) itching. Nose and Sinuses: (-) nasal stuffiness or itchiness, (-) postnasal drip, (-) nosebleeds, (-) sinus trouble. Mouth and Throat: (-) sore throat, (-) hoarseness. Neck: (-) swollen glands, (-) enlarged thyroid , (-) neck pain. Respiratory: - cough, (-) bloody sputum, - shortness of breath, - wheezing. Cardiovascular: - ankle swelling, (-) chest pain. Lymphatic: (-) lymph node enlargement. Neurologic: (-) numbness, (-) tingling. Psychiatric: (-) anxiety, (-) depression   Current Medication: Outpatient Encounter Medications as of 03/21/2024  Medication Sig Note   ACCU-CHEK AVIVA PLUS test strip  01/17/2014: Received from: External Pharmacy   acetaminophen (TYLENOL) 650 MG CR tablet Take 650 mg by mouth every 8 (eight) hours as needed for pain.    albuterol  (VENTOLIN  HFA) 108 (90 Base) MCG/ACT inhaler Inhale 2 puffs into the lungs every 6 (six) hours as needed for wheezing or shortness of breath.    Alcohol Swabs (DROPSAFE ALCOHOL PREP) 70 % PADS     amLODipine  (NORVASC ) 5 MG tablet Take 1 tablet (5 mg total) by mouth daily.    aspirin  EC 81 MG tablet Take 1 tablet (81 mg total) by mouth daily.    atorvastatin  (LIPITOR) 10 MG tablet Take 1 tablet (10 mg total) by mouth daily.    Biotin 5000 MCG TABS Take 1 tablet by mouth daily.    cetirizine (ZYRTEC) 10 MG tablet Take 10 mg by mouth daily.    dapagliflozin propanediol (FARXIGA) 10 MG TABS tablet Take by mouth daily.     famotidine  (PEPCID ) 20 MG tablet Take 1 tablet (20 mg total) by mouth at bedtime.    ferrous sulfate 325 (65 FE) MG tablet Take 325 mg by mouth daily with breakfast.    glipiZIDE  (GLUCOTROL  XL) 5 MG 24 hr tablet Take 1 tablet (5 mg total) by mouth daily with breakfast.    levothyroxine  (SYNTHROID ) 125 MCG tablet Take 1 tablet (125 mcg total) by mouth daily.    losartan -hydrochlorothiazide (HYZAAR) 100-25 MG tablet Take 1 tablet by mouth daily.    metFORMIN  (GLUCOPHAGE ) 1000 MG tablet Take 1 tablet (1,000 mg total) by mouth 2 (two) times daily with a meal.    montelukast  (SINGULAIR ) 10 MG tablet TAKE 1 TABLET EVERY DAY    Omega-3 Fatty Acids (FISH OIL CONCENTRATE) 300 MG CAPS Take by mouth. 11/06/2014: Received from: [facility]   ondansetron  (ZOFRAN ) 4 MG tablet Take 1 tablet (4 mg total) by mouth every 8 (eight) hours as needed for nausea or vomiting.    pantoprazole  (PROTONIX ) 40 MG tablet Take 1 tablet (40 mg total) by mouth daily.    tirzepatide  (MOUNJARO ) 2.5 MG/0.5ML Pen Inject 2.5 mg into the skin once a week.    Turmeric Curcumin 500 MG CAPS Take by mouth.    No facility-administered encounter medications on file as of 03/21/2024.    Surgical History: Past Surgical History:  Procedure Laterality Date   BIOPSY  09/14/2022   Procedure: BIOPSY;  Surgeon: Maryruth Ole DASEN, MD;  Location: [facility] ENDOSCOPY;  Service: Endoscopy;;   BREAST EXCISIONAL BIOPSY Right 1996   neg   BREAST SURGERY     biopsy   CHOLECYSTECTOMY     COLONOSCOPY WITH PROPOFOL  N/A 10/12/2014   Procedure: COLONOSCOPY WITH PROPOFOL ;  Surgeon: Lamar ONEIDA Holmes, MD;  Location: [facility] ENDOSCOPY;  Service: Endoscopy;  Laterality: N/A;   COLONOSCOPY WITH PROPOFOL  N/A 09/14/2022   Procedure: COLONOSCOPY WITH PROPOFOL ;  Surgeon: Maryruth Ole ONEIDA, MD;  Location: [facility] ENDOSCOPY;  Service: Endoscopy;  Laterality: N/A;   DILATION AND CURETTAGE OF UTERUS     ESOPHAGOGASTRODUODENOSCOPY N/A 09/14/2022    Procedure: ESOPHAGOGASTRODUODENOSCOPY (EGD);  Surgeon: Maryruth Ole ONEIDA, MD;  Location: [facility] ENDOSCOPY;  Service: Endoscopy;  Laterality: N/A;   ESOPHAGOGASTRODUODENOSCOPY (EGD) WITH PROPOFOL  N/A 10/12/2014   Procedure: ESOPHAGOGASTRODUODENOSCOPY (EGD) WITH PROPOFOL ;  Surgeon: Lamar ONEIDA Holmes, MD;  Location: [facility] ENDOSCOPY;  Service: Endoscopy;  Laterality: N/A;   NASAL SINUS SURGERY     VAGINAL HYSTERECTOMY  1982    Medical History: Past Medical History:  Diagnosis Date   Allergy    Asthma    Chronic vaginitis    Diabetes mellitus without complication (HCC)    Dyspareunia in female    Eczema    FH: breast cancer    FH: colon cancer    GERD (gastroesophageal reflux disease)    Hiatus hernia syndrome    Hyperlipidemia    Hypertension    Hypothyroidism    Morbid obesity (HCC)    Postmenopausal    Sleep apnea    Superficial varicosities    Vaginal atrophy     Family History: Family History  Problem Relation Age of Onset   Osteoporosis Mother    Breast cancer Maternal Aunt 60   Diabetes Maternal Grandmother    Diabetes Maternal Grandfather    Heart disease Maternal Grandfather    Colon cancer Cousin    Breast cancer Cousin 40       mat cousin   Cancer Father    Ovarian cancer Neg Hx     Social History: Social History   Socioeconomic History   Marital status: Married    Spouse name: Not on file   Number of children: Not on file   Years of education: Not on file   Highest education level: Not on file  Occupational History   Not on file  Tobacco Use   Smoking status: Never   Smokeless tobacco: Never  Vaping Use   Vaping status: Never Used  Substance and Sexual Activity   Alcohol use: No    Alcohol/week: 0.0 standard drinks of alcohol   Drug use: No   Sexual activity: Not Currently    Birth control/protection: Surgical    Comment: hysterectomy  Other Topics Concern   Not on file  Social History Narrative   Not on file   Social Drivers of Health    Tobacco Use: Low Risk (03/21/2024)   Patient History    Smoking Tobacco Use: Never    Smokeless Tobacco Use: Never    Passive Exposure: Not on file  Financial Resource Strain: Low Risk  (03/13/2024)   Received from [facility]   Overall Financial Resource Strain (CARDIA)    Difficulty of Paying Living Expenses: Not hard at all  Food Insecurity: No Food Insecurity (03/13/2024)   Received from [facility]   Epic    Within the past 12 months, you worried that your food would run out before you got the money to  buy more.: Never true    Within the past 12 months, the food you bought just didn't last and you didn't have money to get more.: Never true  Transportation Needs: No Transportation Needs (03/13/2024)   Received from [facility] - Transportation    In the past 12 months, has lack of transportation kept you from medical appointments or from getting medications?: No    Lack of Transportation (Non-Medical): No  Physical Activity: Not on file  Stress: Not on file  Social Connections: Not on file  Intimate Partner Violence: Not on file  Depression (PHQ2-9): Low Risk (11/02/2023)   Depression (PHQ2-9)    PHQ-2 Score: 3  Alcohol Screen: Not on file  Housing: Low Risk  (03/13/2024)   Received from [facility]   Epic    In the last 12 months, was there a time when you were not able to pay the mortgage or rent on time?: No    In the past 12 months, how many times have you moved where you were living?: 0    At any time in the past 12 months, were you homeless or living in a shelter (including now)?: No  Utilities: Not At Risk (03/13/2024)   Received from [facility]   Epic    In the past 12 months has the electric, gas, oil, or water company threatened to shut off services in your home?: No  Health Literacy: Not on file    Vital Signs: Blood pressure (!) 135/96, pulse 89, temperature 98 F  (36.7 C), resp. rate 16, height 5' 3 (1.6 m), weight 220 lb 9.6 oz (100.1 kg), SpO2 96%.  Examination: General Appearance: The patient is well-developed, well-nourished, and in no distress. Skin: Gross inspection of skin unremarkable. Head: normocephalic, no gross deformities. Eyes: no gross deformities noted. ENT: ears appear grossly normal no exudates. Neck: Supple. No thyromegaly. No LAD. Respiratory: no rhonchi noted. Cardiovascular: Normal S1 and S2 without murmur or rub. Extremities: No cyanosis. pulses are equal. Neurologic: Alert and oriented. No involuntary movements.  LABS: Recent Results (from the past 2160 hours)  POCT CBG (Fasting - Glucose)     Status: Abnormal   Collection Time: 02/01/24  2:13 PM  Result Value Ref Range   Glucose Fasting, POC 285 (A) 70 - 99 mg/dL  TSH     Status: None   Collection Time: 02/02/24 11:13 AM  Result Value Ref Range   TSH 2.460 0.450 - 4.500 uIU/mL  Hemoglobin A1c     Status: Abnormal   Collection Time: 02/02/24 11:13 AM  Result Value Ref Range   Hgb A1c MFr Bld 6.9 (H) 4.8 - 5.6 %    Comment:          Prediabetes: 5.7 - 6.4          Diabetes: >6.4          Glycemic control for adults with diabetes: <7.0    Est. average glucose Bld gHb Est-mCnc 151 mg/dL  CBC with Diff     Status: Abnormal   Collection Time: 02/02/24 11:13 AM  Result Value Ref Range   WBC 10.8 3.4 - 10.8 x10E3/uL   RBC 4.32 3.77 - 5.28 x10E6/uL   Hemoglobin 12.8 11.1 - 15.9 g/dL   Hematocrit 59.2 65.9 - 46.6 %   MCV 94 79 - 97 fL   MCH 29.6 26.6 - 33.0 pg   MCHC 31.4 (L) 31.5 - 35.7 g/dL  RDW 12.9 11.7 - 15.4 %   Platelets 351 150 - 450 x10E3/uL   Neutrophils 63 Not Estab. %   Lymphs 25 Not Estab. %   Monocytes 7 Not Estab. %   Eos 4 Not Estab. %   Basos 1 Not Estab. %   Neutrophils Absolute 6.8 1.4 - 7.0 x10E3/uL   Lymphocytes Absolute 2.7 0.7 - 3.1 x10E3/uL   Monocytes Absolute 0.8 0.1 - 0.9 x10E3/uL   EOS (ABSOLUTE) 0.4 0.0 - 0.4 x10E3/uL    Basophils Absolute 0.1 0.0 - 0.2 x10E3/uL   Immature Granulocytes 0 Not Estab. %   Immature Grans (Abs) 0.0 0.0 - 0.1 x10E3/uL  Lipid Panel w/o Chol/HDL Ratio     Status: None   Collection Time: 02/02/24 11:13 AM  Result Value Ref Range   Cholesterol, Total 128 100 - 199 mg/dL   Triglycerides 94 0 - 149 mg/dL   HDL 49 >60 mg/dL   VLDL Cholesterol Cal 18 5 - 40 mg/dL   LDL Chol Calc (NIH) 61 0 - 99 mg/dL  [id number]     Status: Abnormal   Collection Time: 02/02/24 11:13 AM  Result Value Ref Range   Glucose 130 (H) 70 - 99 mg/dL   BUN 20 8 - 27 mg/dL   Creatinine, Ser 9.02 0.57 - 1.00 mg/dL   eGFR 61 >40 fO/fpw/8.26   BUN/Creatinine Ratio 21 12 - 28   Sodium 141 134 - 144 mmol/L   Potassium 4.6 3.5 - 5.2 mmol/L   Chloride 100 96 - 106 mmol/L   CO2 24 20 - 29 mmol/L   Calcium  10.0 8.7 - 10.3 mg/dL   Total Protein 6.7 6.0 - 8.5 g/dL   Albumin 4.5 3.8 - 4.8 g/dL   Globulin, Total 2.2 1.5 - 4.5 g/dL   Bilirubin Total 0.4 0.0 - 1.2 mg/dL   Alkaline Phosphatase 96 49 - 135 IU/L   AST 13 0 - 40 IU/L   ALT 12 0 - 32 IU/L  POCT CBG (Fasting - Glucose)     Status: Abnormal   Collection Time: 03/13/24  1:32 PM  Result Value Ref Range   Glucose Fasting, POC 128 (A) 70 - 99 mg/dL  Pulmonary Function Test     Status: None   Collection Time: 03/20/24  3:11 PM  Result Value Ref Range   FEV1     FVC     FEV1/FVC     TLC     DLCO      Radiology: MM 3D SCREENING MAMMOGRAM BILATERAL BREAST Result Date: 11/05/2023 CLINICAL DATA:  Screening. EXAM: DIGITAL SCREENING BILATERAL MAMMOGRAM WITH TOMOSYNTHESIS AND CAD TECHNIQUE: Bilateral screening digital craniocaudal and mediolateral oblique mammograms were obtained. Bilateral screening digital breast tomosynthesis was performed. The images were evaluated with computer-aided detection. COMPARISON:  Previous exam(s). ACR Breast Density Category b: There are scattered areas of fibroglandular density. FINDINGS: There are no findings suspicious for  malignancy. IMPRESSION: No mammographic evidence of malignancy. A result letter of this screening mammogram will be mailed directly to the patient. RECOMMENDATION: Screening mammogram in one year. (Code:SM-B-01Y) BI-RADS CATEGORY  1: Negative. Electronically Signed   By: Alm Parkins M.D.   On: 11/05/2023 12:07    No results found.  No results found.  Assessment and Plan: Patient Active Problem List   Diagnosis Date Noted   Nausea 03/13/2024   Class 2 severe obesity due to excess calories with serious comorbidity and body mass index (BMI) of 39.0 to 39.9 in adult 03/13/2024  Morbid obesity with body mass index (BMI) of 40.0 to 44.9 in adult [facility]) 02/01/2024   Type 2 diabetes mellitus with hyperglycemia, without long-term current use of insulin (HCC) 11/02/2023   Combined hyperlipidemia associated with type 2 diabetes mellitus (HCC) 11/02/2023   Hypertension associated with diabetes (HCC) 11/02/2023   Other specified hypothyroidism 11/02/2023   Chronic pain of both knees 11/02/2023   Screening for osteoporosis 11/02/2023   Gastroesophageal reflux disease without esophagitis 11/02/2023   Chronic asthma, mild intermittent, uncomplicated 02/15/2023   OSA on CPAP 02/15/2023   Obesity, morbid (HCC) 02/15/2023   Leucocytosis 12/30/2021   Varicose veins with pain 11/02/2019   Intractable chronic migraine without aura and without status migrainosus 04/26/2019   Primary osteoarthritis of left knee 12/05/2018   Leg pain 10/19/2017   Chronic venous insufficiency 03/02/2017   Lymphedema 03/02/2017   Pelvic pain in female 07/10/2015   Vaginal atrophy 07/10/2015   Status post vaginal hysterectomy 07/10/2015    1. Chronic asthma, mild intermittent, uncomplicated (Primary) ***  2. Obesity, morbid (HCC) ***  3. OSA on CPAP ***   General Counseling: I have discussed the findings of the evaluation and examination with Apolinar.  I have also discussed any further diagnostic evaluation thatmay  be needed or ordered today. Jakari verbalizes understanding of the findings of todays visit. We also reviewed her medications today and discussed drug interactions and side effects including but not limited excessive drowsiness and altered mental states. We also discussed that there is always a risk not just to her but also people around her. she has been encouraged to call the office with any questions or concerns that should arise related to todays visit.  No orders of the defined types were placed in this encounter.    Time spent: 16  I have personally obtained a history, examined the patient, evaluated laboratory and imaging results, formulated the assessment and plan and placed orders.    Elfreda DELENA Bathe, MD [facility] Pulmonary and Critical Care Sleep medicine

## 2024-05-15 ENCOUNTER — Ambulatory Visit: Admitting: Internal Medicine

## 2024-08-14 ENCOUNTER — Ambulatory Visit: Admitting: Podiatry

## 2025-03-14 ENCOUNTER — Encounter: Admitting: Internal Medicine

## 2025-03-27 ENCOUNTER — Ambulatory Visit: Admitting: Internal Medicine
